# Patient Record
Sex: Female | Born: 1935
Health system: Southern US, Community
[De-identification: ages and names within clinical notes are randomized; demographics above are authoritative.]

## PROBLEM LIST (undated history)

## (undated) DIAGNOSIS — I509 Heart failure, unspecified: Secondary | ICD-10-CM

## (undated) DIAGNOSIS — E871 Hypo-osmolality and hyponatremia: Secondary | ICD-10-CM

## (undated) DIAGNOSIS — J309 Allergic rhinitis, unspecified: Secondary | ICD-10-CM

## (undated) DIAGNOSIS — N8110 Cystocele, unspecified: Secondary | ICD-10-CM

## (undated) DIAGNOSIS — M1990 Unspecified osteoarthritis, unspecified site: Secondary | ICD-10-CM

## (undated) DIAGNOSIS — K635 Polyp of colon: Secondary | ICD-10-CM

## (undated) DIAGNOSIS — C679 Malignant neoplasm of bladder, unspecified: Secondary | ICD-10-CM

## (undated) DIAGNOSIS — E7800 Pure hypercholesterolemia, unspecified: Secondary | ICD-10-CM

## (undated) DIAGNOSIS — R0602 Shortness of breath: Secondary | ICD-10-CM

## (undated) DIAGNOSIS — C189 Malignant neoplasm of colon, unspecified: Secondary | ICD-10-CM

## (undated) DIAGNOSIS — I499 Cardiac arrhythmia, unspecified: Secondary | ICD-10-CM

## (undated) DIAGNOSIS — I2510 Atherosclerotic heart disease of native coronary artery without angina pectoris: Secondary | ICD-10-CM

## (undated) DIAGNOSIS — G4733 Obstructive sleep apnea (adult) (pediatric): Secondary | ICD-10-CM

## (undated) DIAGNOSIS — R040 Epistaxis: Secondary | ICD-10-CM

## (undated) DIAGNOSIS — K5790 Diverticulosis of intestine, part unspecified, without perforation or abscess without bleeding: Secondary | ICD-10-CM

## (undated) DIAGNOSIS — R0989 Other specified symptoms and signs involving the circulatory and respiratory systems: Secondary | ICD-10-CM

## (undated) DIAGNOSIS — K8020 Calculus of gallbladder without cholecystitis without obstruction: Secondary | ICD-10-CM

## (undated) DIAGNOSIS — D649 Anemia, unspecified: Secondary | ICD-10-CM

## (undated) HISTORY — PX: CARDIAC CATHETERIZATION: SHX172

## (undated) HISTORY — DX: Malignant neoplasm of bladder, unspecified: C67.9

## (undated) HISTORY — DX: Shortness of breath: R06.02

## (undated) HISTORY — PX: COLONOSCOPY: SHX174

## (undated) HISTORY — DX: Epistaxis: R04.0

## (undated) HISTORY — DX: Heart failure, unspecified: I50.9

## (undated) HISTORY — DX: Other specified symptoms and signs involving the circulatory and respiratory systems: R09.89

## (undated) HISTORY — DX: Allergic rhinitis, unspecified: J30.9

## (undated) HISTORY — PX: OTHER SURGICAL HISTORY: SHX169

## (undated) HISTORY — PX: ABDOMINAL HYSTERECTOMY: SHX81

## (undated) HISTORY — PX: COLECTOMY: SHX59

## (undated) HISTORY — DX: Pure hypercholesterolemia, unspecified: E78.00

## (undated) HISTORY — DX: Malignant neoplasm of colon, unspecified: C18.9

## (undated) HISTORY — DX: Calculus of gallbladder without cholecystitis without obstruction: K80.20

## (undated) HISTORY — DX: Cystocele, unspecified: N81.10

## (undated) HISTORY — DX: Hypo-osmolality and hyponatremia: E87.1

## (undated) HISTORY — DX: Cardiac arrhythmia, unspecified: I49.9

## (undated) HISTORY — DX: Diverticulosis of intestine, part unspecified, without perforation or abscess without bleeding: K57.90

## (undated) HISTORY — DX: Unspecified osteoarthritis, unspecified site: M19.90

## (undated) HISTORY — DX: Atherosclerotic heart disease of native coronary artery without angina pectoris: I25.10

## (undated) HISTORY — DX: Anemia, unspecified: D64.9

## (undated) HISTORY — DX: Polyp of colon: K63.5

## (undated) HISTORY — DX: Obstructive sleep apnea (adult) (pediatric): G47.33

## (undated) HISTORY — PX: CHOLECYSTECTOMY: SHX55

---

## 1987-12-18 DIAGNOSIS — C189 Malignant neoplasm of colon, unspecified: Secondary | ICD-10-CM

## 1987-12-18 HISTORY — DX: Malignant neoplasm of colon, unspecified: C18.9

## 1998-08-15 ENCOUNTER — Other Ambulatory Visit: Admission: RE | Admit: 1998-08-15 | Discharge: 1998-08-15 | Payer: Self-pay | Admitting: *Deleted

## 1998-09-24 ENCOUNTER — Emergency Department (HOSPITAL_COMMUNITY): Admission: EM | Admit: 1998-09-24 | Discharge: 1998-09-24 | Payer: Self-pay | Admitting: Emergency Medicine

## 1999-01-18 ENCOUNTER — Emergency Department (HOSPITAL_COMMUNITY): Admission: EM | Admit: 1999-01-18 | Discharge: 1999-01-19 | Payer: Self-pay | Admitting: Emergency Medicine

## 1999-09-04 ENCOUNTER — Other Ambulatory Visit: Admission: RE | Admit: 1999-09-04 | Discharge: 1999-09-04 | Payer: Self-pay | Admitting: *Deleted

## 1999-09-13 ENCOUNTER — Ambulatory Visit (HOSPITAL_COMMUNITY): Admission: RE | Admit: 1999-09-13 | Discharge: 1999-09-13 | Payer: Self-pay | Admitting: Gastroenterology

## 2001-02-26 ENCOUNTER — Other Ambulatory Visit: Admission: RE | Admit: 2001-02-26 | Discharge: 2001-02-26 | Payer: Self-pay | Admitting: *Deleted

## 2002-06-23 ENCOUNTER — Other Ambulatory Visit: Admission: RE | Admit: 2002-06-23 | Discharge: 2002-06-23 | Payer: Self-pay | Admitting: *Deleted

## 2005-01-29 ENCOUNTER — Inpatient Hospital Stay (HOSPITAL_COMMUNITY): Admission: EM | Admit: 2005-01-29 | Discharge: 2005-01-31 | Payer: Self-pay | Admitting: Emergency Medicine

## 2005-09-20 ENCOUNTER — Encounter: Admission: RE | Admit: 2005-09-20 | Discharge: 2005-09-20 | Payer: Self-pay | Admitting: Orthopaedic Surgery

## 2005-10-09 ENCOUNTER — Encounter: Admission: RE | Admit: 2005-10-09 | Discharge: 2005-10-09 | Payer: Self-pay | Admitting: Orthopaedic Surgery

## 2005-11-06 ENCOUNTER — Encounter: Admission: RE | Admit: 2005-11-06 | Discharge: 2005-11-06 | Payer: Self-pay | Admitting: Orthopaedic Surgery

## 2007-09-17 ENCOUNTER — Ambulatory Visit: Payer: Self-pay | Admitting: Gastroenterology

## 2007-09-24 ENCOUNTER — Ambulatory Visit: Payer: Self-pay | Admitting: Gastroenterology

## 2007-09-24 ENCOUNTER — Encounter: Payer: Self-pay | Admitting: Gastroenterology

## 2009-02-22 ENCOUNTER — Encounter: Admission: RE | Admit: 2009-02-22 | Discharge: 2009-02-22 | Payer: Self-pay | Admitting: Internal Medicine

## 2009-02-22 ENCOUNTER — Other Ambulatory Visit: Admission: RE | Admit: 2009-02-22 | Discharge: 2009-02-22 | Payer: Self-pay | Admitting: Interventional Radiology

## 2009-02-22 ENCOUNTER — Encounter (INDEPENDENT_AMBULATORY_CARE_PROVIDER_SITE_OTHER): Payer: Self-pay | Admitting: Interventional Radiology

## 2009-12-05 ENCOUNTER — Emergency Department (HOSPITAL_COMMUNITY): Admission: EM | Admit: 2009-12-05 | Discharge: 2009-12-06 | Payer: Self-pay | Admitting: Emergency Medicine

## 2009-12-22 ENCOUNTER — Encounter: Admission: RE | Admit: 2009-12-22 | Discharge: 2009-12-22 | Payer: Self-pay | Admitting: Cardiology

## 2010-01-10 ENCOUNTER — Inpatient Hospital Stay (HOSPITAL_BASED_OUTPATIENT_CLINIC_OR_DEPARTMENT_OTHER): Admission: RE | Admit: 2010-01-10 | Discharge: 2010-01-10 | Payer: Self-pay | Admitting: Cardiology

## 2010-08-30 ENCOUNTER — Ambulatory Visit: Payer: Self-pay | Admitting: Cardiology

## 2010-10-31 ENCOUNTER — Ambulatory Visit: Payer: Self-pay | Admitting: Cardiology

## 2011-01-21 ENCOUNTER — Emergency Department (HOSPITAL_COMMUNITY)
Admission: EM | Admit: 2011-01-21 | Discharge: 2011-01-21 | Disposition: A | Discharge status: Home / Self Care | Payer: Medicare Other | Attending: Emergency Medicine | Admitting: Emergency Medicine

## 2011-01-21 DIAGNOSIS — I10 Essential (primary) hypertension: Secondary | ICD-10-CM | POA: Insufficient documentation

## 2011-01-21 DIAGNOSIS — Z85038 Personal history of other malignant neoplasm of large intestine: Secondary | ICD-10-CM | POA: Insufficient documentation

## 2011-01-21 DIAGNOSIS — R040 Epistaxis: Secondary | ICD-10-CM | POA: Insufficient documentation

## 2011-01-21 LAB — PROTIME-INR
INR: 0.97 (ref 0.00–1.49)
Prothrombin Time: 13.1 seconds (ref 11.6–15.2)

## 2011-01-21 LAB — CBC
HCT: 38.2 % (ref 36.0–46.0)
Hemoglobin: 13.3 g/dL (ref 12.0–15.0)
MCH: 32.7 pg (ref 26.0–34.0)
MCHC: 34.8 g/dL (ref 30.0–36.0)
MCV: 93.9 fL (ref 78.0–100.0)
Platelets: 237 10*3/uL (ref 150–400)
RDW: 12.4 % (ref 11.5–15.5)
WBC: 11.4 10*3/uL — ABNORMAL HIGH (ref 4.0–10.5)

## 2011-03-04 LAB — BASIC METABOLIC PANEL
BUN: 43 mg/dL — ABNORMAL HIGH (ref 6–23)
CO2: 28 mEq/L (ref 19–32)
Calcium: 8.9 mg/dL (ref 8.4–10.5)
Chloride: 102 mEq/L (ref 96–112)
Creatinine, Ser: 0.96 mg/dL (ref 0.4–1.2)
GFR calc Af Amer: >60 mL/min (ref >60)
GFR calc non Af Amer: 56 mL/min — ABNORMAL LOW (ref >60)
Glucose, Bld: 97 mg/dL (ref 70–99)
Potassium: 3.9 mEq/L (ref 3.5–5.1)
Sodium: 137 mEq/L (ref 135–145)

## 2011-03-19 LAB — URINALYSIS, ROUTINE W REFLEX MICROSCOPIC
Bilirubin Urine: NEGATIVE
Glucose, UA: NEGATIVE mg/dL
Hgb urine dipstick: NEGATIVE
Ketones, ur: NEGATIVE mg/dL
Nitrite: NEGATIVE
Protein, ur: NEGATIVE mg/dL
Specific Gravity, Urine: 1.03 (ref 1.005–1.030)
Urobilinogen, UA: 0.2 mg/dL (ref 0.0–1.0)
pH: 5.5 (ref 5.0–8.0)

## 2011-03-19 LAB — POCT I-STAT, CHEM 8
BUN: 56 mg/dL — ABNORMAL HIGH (ref 6–23)
Calcium, Ion: 1.1 mmol/L — ABNORMAL LOW (ref 1.12–1.32)
Chloride: 105 mEq/L (ref 96–112)
Creatinine, Ser: 1.1 mg/dL (ref 0.4–1.2)
Glucose, Bld: 175 mg/dL — ABNORMAL HIGH (ref 70–99)
HCT: 38 % (ref 36.0–46.0)
Hemoglobin: 12.9 g/dL (ref 12.0–15.0)
Potassium: 5.7 mEq/L — ABNORMAL HIGH (ref 3.5–5.1)
Sodium: 134 mEq/L — ABNORMAL LOW (ref 135–145)
TCO2: 28 mmol/L (ref 0–100)

## 2011-03-19 LAB — DIFFERENTIAL
Basophils Absolute: 0.1 10*3/uL (ref 0.0–0.1)
Basophils Relative: 1 % (ref 0–1)
Eosinophils Absolute: 0 10*3/uL (ref 0.0–0.7)
Eosinophils Relative: 1 % (ref 0–5)
Lymphocytes Relative: 13 % (ref 12–46)
Lymphs Abs: 1.2 10*3/uL (ref 0.7–4.0)
Monocytes Absolute: 0.5 10*3/uL (ref 0.1–1.0)
Monocytes Relative: 6 % (ref 3–12)
Neutro Abs: 7.3 10*3/uL (ref 1.7–7.7)
Neutrophils Relative %: 79 % — ABNORMAL HIGH (ref 43–77)

## 2011-03-19 LAB — URINE CULTURE
Colony Count: NO GROWTH
Culture: NO GROWTH

## 2011-03-19 LAB — CBC
HCT: 38.1 % (ref 36.0–46.0)
Hemoglobin: 12.8 g/dL (ref 12.0–15.0)
MCHC: 33.6 g/dL (ref 30.0–36.0)
MCV: 93.8 fL (ref 78.0–100.0)
Platelets: 194 10*3/uL (ref 150–400)
RBC: 4.06 MIL/uL (ref 3.87–5.11)
RDW: 13.4 % (ref 11.5–15.5)
WBC: 9.2 10*3/uL (ref 4.0–10.5)

## 2011-03-19 LAB — SAMPLE TO BLOOD BANK

## 2011-05-04 NOTE — Discharge Summary (Signed)
Paige Cook, Paige Cook                ACCOUNT NO.:  [id number]   MEDICAL RECORD NO.:  [id number]          PATIENT TYPE:  INP   LOCATION:  [id number]                         FACILITY:  [facility]   PHYSICIAN:  Sherryl Manges, M.D.  DATE OF BIRTH:  [birth year]   DATE OF ADMISSION:  01/29/2005  DATE OF DISCHARGE:  01/31/2005                                 DISCHARGE SUMMARY   PRIMARY CARE PHYSICIAN:  Dr. Levin Erp.   DISCHARGE DIAGNOSES:  1.  Acute gastroenteritis.  2.  Urinary tract infection.   DISCHARGE MEDICATIONS:  1.  Ciprofloxacin 250 mg p.o. b.i.d. x6 days only.  2.  Continue preadmission medications.   PROCEDURE:  None.   CONSULTATIONS:  None.   ADMISSION HISTORY:  As in H&P notes of January 29, 2005; however, in brief,  this is a 77 year old female with past medical history significant for colon  cancer diagnosed in 1989, status post partial colectomy.  Normal subsequent  colonoscopies.  Status post bladder tacking surgery.  Presents with a 3-4  day history of nausea, vomiting, and diarrhea with liquid, yellow stools,  over 10 per day.  No antecedent respiratory tract infection, sick contacts,  recent travel, or antibiotic therapy.  She was admitted for evaluation,  investigation, and management.   CLINICAL COURSE:  1.  Acute gastroenteritis:  Patient presented with a 3-4 day history of      nausea, vomiting, and diarrhea.  On initial evaluation in the emergency      department, she was found to be orthostatic.  Also, BUN was elevated at      66 with a creatinine of 1.3, suggestive of dehydration.  Serum amylase      and lipase levels were within normal limits.  The patient had no      associated abdominal pain.  C. difficile toxin was negative.  No WBCs      were seen on fecal microscopy.  Patient was managed with intravenous      fluid hydration, antiemetics, proton pump inhibitors, and loperamide      with considerable amelioration of symptoms.  Certainly, by the a.m. of    January 31, 2005, the patient was completely asymptomatic.      Electrolytes on the day of discharge showed a BUN of 10 and a creatinine      of 0.9.   1.  Urinary tract infection:  Urinalysis on January 29, 2005 showed a      specific gravity of 1.025, pH 6, with many bacteria, wbc of 0 to 2.      This is consistent with asymptomatic bacteriuria.  Given the patient's      age, it is felt that she was at risk for symptomatic urinary tract      infection.  She was therefore treated with ciprofloxacin with a 7-day      course planned.   DISPOSITION:  Patient was discharged in satisfactory condition on February 01, 2005.   PAIN MANAGEMENT:  Not applicable.   ACTIVITY:  No restrictions.   DIET:  Recommended to avoid fruits, uncooked vegetables, and fresh  milk for  about five days.   WOUND CARE:  Not applicable.   SPECIAL INSTRUCTIONS:  Patient is recommended to ensure adequate fluid  intake.   FOLLOW UP:  She is recommended to follow up routinely with her primary care  physician, Dr. Levin Erp.  She is to call for an appointment.      CO/MEDQ  D:  02/01/2005  T:  02/01/2005  Job:  [id number]   cc:   Lance Muss, M.D.  [address], [place]  [place] [address]  Fax: [phone]

## 2011-05-04 NOTE — H&P (Signed)
[name], Cook                ACCOUNT NO.:  [id number]   MEDICAL RECORD NO.:  [id number]          PATIENT TYPE:  EMS   LOCATION:  ED                           FACILITY:  [facility]   PHYSICIAN:  Rexene Alberts, M.D.    DATE OF BIRTH:  Aug 28, 1928   DATE OF ADMISSION:  01/29/2005  DATE OF DISCHARGE:                                HISTORY & PHYSICAL   PRIMARY CARE PHYSICIAN:  Lance Muss, M.D.   CHIEF COMPLAINT:  Nausea, vomiting, diarrhea, and generalized weakness.   HISTORY OF PRESENT ILLNESS:  The patient is a 77 year old lady with a past  medical history significant for colon cancer who presents to the emergency  department this morning with a 3-4 day history of nausea, vomiting, and  diarrhea.  The symptoms started early in the morning approximately three  days ago.  It started with vomiting initially but later on in the day and  night, she began to have diarrhea.  The nausea and vomiting subsided on day  #1;  however, the diarrhea continued.  The following day, she again had  nausea and vomiting along with diarrhea.  The nausea, vomiting, and diarrhea  have persisted for at least 3-4 days now.  She has vomited approximately two  times each day for the past three days.  She has had more than 10 stools  each day over the past 3-4 days.  Initially, the volume of stools were  large, but now she only has small stools.  The stools are liquidy and yellow  in color.  She denies any bloody stools, and she denies any black tarry  stools.  She has not had any coffee-ground emesis nor has she had bright red  blood in her emesis.  She has only had mild abdominal cramping this morning,  none over the previous three days.  She has been able to keep a little  ginger ale and water down this morning for the first time.  She has had no  upper respiratory infection symptoms.  She has not been exposed to any sick  contacts.  Her husband has none of her symptoms in spite of eating the same  foods,  etc.  The patient has not had any antibiotic therapy recently.  No  travel recently.  No insect bites.  The patient did eat some deli meat on  Thursday;  however, her husband ate it as well, and he does not have any of  her symptoms.  The patient has had no fever or chills.   When the patient was evaluated in the emergency department, she was afebrile  with a temperature of 98.7, blood pressure 161/81, pulse 103, and oxygen  saturation 96%.  When the patient stood up, her blood pressure fell to  132/71.  Her white blood cell count is mildly elevated at 13.4.  Her BUN is  elevated at 66, and the creatinine is elevated at 1.3.  The patient will be  admitted for further evaluation and management.   PAST MEDICAL HISTORY:  1.  Colon cancer diagnosed in 1989, status post partial colectomy.  All of      the follow-up colonoscopies have been within normal limits.  2.  Status post bladder tacking surgery x2.  Postoperative anemia.      Perioperative lysis of adhesions.  3.  Chronic vaginitis on topical Premarin cream.   MEDICATIONS:  1.  Vitamin C 500 mg once or twice daily.  2.  Premarin cream as needed.   ALLERGIES:  PENICILLIN which causes a rash.   SOCIAL HISTORY:  The patient is married and lives with her husband in  [place], [place].  She has four children.  One son died in 24  secondary to AIDS.  She does not smoke.  She drinks wine occasionally.  She  is a retired Oncologist.   FAMILY HISTORY:  Her father died of a heart attack at 72 years of age.  Her  mother died of heart disease at 97 years of age.   REVIEW OF SYSTEMS:  The patient review of systems is otherwise negative.   PHYSICAL EXAMINATION:  VITAL SIGNS:  Temperature 98.7, blood pressure  161/81, repeated at 132/71 while standing, pulse 103, respiratory rate 18,  oxygen saturation 96% on room air.  GENERAL:  The patient is a pleasant, 77 year old Caucasian lady who is  currently lying in bed in no  acute distress.  HEENT:  Head is normocephalic, nontraumatic.  Pupils are equal, round, and  reactive to light.  Extraocular movements are intact.  Conjunctivae are  clear.  Sclerae are white.  Tympanic membranes are clear bilaterally.  Nasal  mucosa is mildly dry.  No sinus tenderness.  Oropharynx reveals fairly good  dentition.  Mucous membranes are mildly dry.  No posterior exudates or  erythema.  NECK:  Supple.  Question mild thyromegaly.  No bruit, no JVD, no adenopathy.  LUNGS:  Clear to auscultation bilaterally.  HEART:  S1 and S2 with mild tachycardia.  ABDOMEN:  Vertical hypogastric scar, well healed.  Right upper quadrant  scar, well healed.  Abdomen is mildly obese.  Bowel sounds are present.  Abdomen is soft, nontender, nondistended, no hepatosplenomegaly, no masses  palpated.  RECTAL and GENITOURINARY:  Deferred.  EXTREMITIES:  Pedal pulses 2+ bilaterally.  No pretibial edema, no pedal  edema.  No acute joint abnormalities.  The patient has good range of motion  of all of her joints.  NEUROLOGIC:  The patient is alert and oriented x3.  Cranial nerves II-XII  are intact.  Strength is 5/5 throughout.  Sensation is intact throughout.   ADMISSION LABORATORIES:  Urinalysis:  Urine bilirubin small, urine  hemoglobin negative, urine glucose negative, urine specific gravity 1.025,  urine protein 30, urine ketones trace, urine nitrite negative, urine  leukocyte esterase negative, urine epithelial cells many, bacteria many,  wbc's 0-2.  Sodium 136, potassium 3.3, chloride 100, CO2 26, glucose 161,  BUN 66, creatinine 1.3, calcium 9.4.  WBC 13.4, hemoglobin 16.3, hematocrit  47.3, MCV 90.4, platelets 306,000.   ASSESSMENT:  1.  NAUSEA, VOMITING, DIARRHEA, MOST LIKELY SECONDARY TO GASTROENTERITIS.      The patient is orthostatic and is mildly tachycardic.  She is obviously     volume depleted clinically and per the laboratory data.  2.  ACUTE RENAL INSUFFICIENCY.  The patient's BUN  is 66, and creatinine is      1.3.  The patient has no prior history of renal disease.  The acute      renal insufficiency if probably secondary to prerenal azotemia.  3.  HYPOKALEMIA.  The hypokalemia is probably secondary to the diarrhea.  4.  LEUKOCYTOSIS.  The patient's white blood cell count is mildly elevated      at 13.4.  The leukocytosis is probably a reactive leukocytosis versus a      viral and/or bacterial gastroenteritis.  The patient is afebrile,      however.  5.  HYPERGLYCEMIA.  The patient's glucose is 161.  The patient has no prior      history of diabetes.   PLAN:  1.  The patient will be admitted for further evaluation and management.  2.  Will hold on antibiotics and antiparasitic medication for now.  3.  Will check stool specimens for routine culture and sensitivity,      Clostridium difficile, Giardia, Cryptosporidium, and fecal wbc's.  4.  Will also guaiac stools x2.  5.  Volume repletion with D-5-normal saline with 30 mEq of potassium      chloride added.  6.  Phenergan as needed for nausea.  7.  Empiric Protonix at 40 mg IV daily.  8.  Will check TSH and hemoglobin A1c in the a.m.  9.  Will follow renal function and renal function tests.      DF/MEDQ  D:  01/29/2005  T:  01/29/2005  Job:  [id number]   cc:   Lance Muss, M.D.  [address], [place]  [place] [address]  Fax: [phone]

## 2011-05-18 ENCOUNTER — Ambulatory Visit (INDEPENDENT_AMBULATORY_CARE_PROVIDER_SITE_OTHER): Payer: Medicare Other | Admitting: Cardiology

## 2011-05-18 ENCOUNTER — Encounter: Payer: Self-pay | Admitting: *Deleted

## 2011-05-18 ENCOUNTER — Encounter: Payer: Self-pay | Admitting: Cardiology

## 2011-05-18 DIAGNOSIS — I428 Other cardiomyopathies: Secondary | ICD-10-CM

## 2011-05-18 DIAGNOSIS — I509 Heart failure, unspecified: Secondary | ICD-10-CM

## 2011-05-18 DIAGNOSIS — R0989 Other specified symptoms and signs involving the circulatory and respiratory systems: Secondary | ICD-10-CM | POA: Insufficient documentation

## 2011-05-18 DIAGNOSIS — I2510 Atherosclerotic heart disease of native coronary artery without angina pectoris: Secondary | ICD-10-CM

## 2011-05-18 DIAGNOSIS — E7800 Pure hypercholesterolemia, unspecified: Secondary | ICD-10-CM

## 2011-05-18 LAB — CBC WITH DIFFERENTIAL/PLATELET
Basophils Absolute: 0 10*3/uL (ref 0.0–0.1)
Basophils Relative: 0.6 % (ref 0.0–3.0)
Eosinophils Absolute: 0.3 10*3/uL (ref 0.0–0.7)
Eosinophils Relative: 4.1 % (ref 0.0–5.0)
HCT: 36.1 % (ref 36.0–46.0)
Hemoglobin: 12.5 g/dL (ref 12.0–15.0)
Lymphocytes Relative: 19.9 % (ref 12.0–46.0)
Lymphs Abs: 1.4 10*3/uL (ref 0.7–4.0)
MCHC: 34.5 g/dL (ref 30.0–36.0)
MCV: 96.4 fl (ref 78.0–100.0)
Monocytes Absolute: 0.6 10*3/uL (ref 0.1–1.0)
Monocytes Relative: 8.8 % (ref 3.0–12.0)
Neutro Abs: 4.8 10*3/uL (ref 1.4–7.7)
Neutrophils Relative %: 66.6 % (ref 43.0–77.0)
Platelets: 188 10*3/uL (ref 150.0–400.0)
RBC: 3.75 Mil/uL — ABNORMAL LOW (ref 3.87–5.11)
RDW: 13 % (ref 11.5–14.6)
WBC: 7.3 10*3/uL (ref 4.5–10.5)

## 2011-05-18 LAB — LIPID PANEL
Cholesterol: 189 mg/dL (ref 0–200)
HDL: 85.6 mg/dL (ref >39.00)
LDL Cholesterol: 92 mg/dL (ref 0–99)
Total CHOL/HDL Ratio: 2
Triglycerides: 57 mg/dL (ref 0.0–149.0)
VLDL: 11.4 mg/dL (ref 0.0–40.0)

## 2011-05-18 LAB — BASIC METABOLIC PANEL
BUN: 34 mg/dL — ABNORMAL HIGH (ref 6–23)
CO2: 30 mEq/L (ref 19–32)
Calcium: 9.1 mg/dL (ref 8.4–10.5)
Chloride: 104 mEq/L (ref 96–112)
Creatinine, Ser: 1.1 mg/dL (ref 0.4–1.2)
GFR: 51.75 mL/min — ABNORMAL LOW (ref >60.00)
Glucose, Bld: 113 mg/dL — ABNORMAL HIGH (ref 70–99)
Potassium: 4.4 mEq/L (ref 3.5–5.1)
Sodium: 140 mEq/L (ref 135–145)

## 2011-05-18 LAB — HEPATIC FUNCTION PANEL
ALT: 20 U/L (ref 0–35)
AST: 22 U/L (ref 0–37)
Albumin: 3.8 g/dL (ref 3.5–5.2)
Alkaline Phosphatase: 56 U/L (ref 39–117)
Bilirubin, Direct: 0.1 mg/dL (ref 0.0–0.3)
Total Bilirubin: 0.8 mg/dL (ref 0.3–1.2)
Total Protein: 6.2 g/dL (ref 6.0–8.3)

## 2011-05-18 NOTE — Assessment & Plan Note (Signed)
We will follow up on fasting lab work today including chemistries and lipid panel. Patient is on simvastatin.

## 2011-05-18 NOTE — Assessment & Plan Note (Signed)
She has a loud left carotid bruit. Her last Doppler study was in 2003. We will obtain a followup carotid Doppler study.

## 2011-05-18 NOTE — Patient Instructions (Signed)
We will schedule you for an Echocardiogram and Carotid doppler studies.  We will check fasting lab work today and call you with the results.  Continue your current medications.  I will see you back in 6 months.

## 2011-05-18 NOTE — Assessment & Plan Note (Signed)
She appears to be well compensated on current medical therapy with ACE inhibitors and carvedilol. She is euvolemic on Lasix. We will schedule her for a followup echocardiogram.

## 2011-05-18 NOTE — Progress Notes (Signed)
   Paige Cook Date of Birth: 1928/03/03   History of Present Illness: Paige Cook is seen for followup today. She states she is feeling well. She is able to get out and work in the yard without any difficulty. She denies any shortness of breath, PND, orthopnea, or edema. She has had some sharp discomfort beneath and lateral to her left breast on one night. This has resolved. She denies any TIA or CVA symptoms.  Current Outpatient Prescriptions on File Prior to Visit  Medication Sig Dispense Refill  . aspirin 81 MG tablet Take 81 mg by mouth daily.        . carvedilol (COREG) 6.25 MG tablet Take 6.25 mg by mouth 2 (two) times daily with a meal.        . conjugated estrogens (PREMARIN) vaginal cream Place vaginally daily. 2 times/week       . fish oil-omega-3 fatty acids 1000 MG capsule Take 2 g by mouth daily.        . furosemide (LASIX) 40 MG tablet Take 40 mg by mouth daily.        Marland Kitchen glucosamine-chondroitin 500-400 MG tablet Take 1 tablet by mouth daily.        . ramipril (ALTACE) 5 MG tablet Take 5 mg by mouth daily.        . simvastatin (ZOCOR) 20 MG tablet Take 20 mg by mouth at bedtime.        . vitamin C (ASCORBIC ACID) 500 MG tablet Take 500 mg by mouth daily.          Allergies  Allergen Reactions  . Penicillins     Past Medical History  Diagnosis Date  . CHF (congestive heart failure)     CHF with systolic dysfuntion ; dilated nonishemic cardiomyopathy  . Cancer     1989 colon    Past Surgical History  Procedure Date  . Cardiac catheterization     non obstruction CAD  . Cholecystectomy   . Bladder tact   . Colectomy     sigmoid 1989 cancer    History  Smoking status  . Never Smoker   Smokeless tobacco  . Not on file    History  Alcohol Use No    Family History  Problem Relation Age of Onset  . Heart disease Father     Review of Systems:  All other systems were reviewed and are negative.  Physical Exam: BP 116/60  Pulse 60  Ht 5\' 6"  (1.676 m)   Wt 132 lb 9.6 oz (60.147 kg)  BMI 21.40 kg/m2 She is a pleasant elderly white female in no acute distress. Her HEENT exam is unremarkable. She has no JVD. She has a loud left carotid bruit. There is no thyromegaly or adenopathy. Lungs are clear. Cardiac exam reveals a regular rate and rhythm without gallop or murmur. Abdomen is soft and nontender. She has no hepatosplenomegaly. She has no edema. Pedal pulses are good. LABORATORY DATA:   Assessment / Plan:

## 2011-05-21 ENCOUNTER — Telehealth: Payer: Self-pay | Admitting: *Deleted

## 2011-05-21 NOTE — Telephone Encounter (Signed)
Message copied by Hetty Blend on Mon May 21, 2011  4:13 PM ------      Message from: [place], PETER M      Created: Fri May 18, 2011  5:28 PM       CBC and chemistries are good. Lipids are in good shape.

## 2011-05-21 NOTE — Telephone Encounter (Signed)
Notified of lab results. Will send pt copy and Dr. Reynaldo Minium.

## 2011-05-30 ENCOUNTER — Encounter (INDEPENDENT_AMBULATORY_CARE_PROVIDER_SITE_OTHER): Payer: Medicare Other | Admitting: *Deleted

## 2011-05-30 ENCOUNTER — Ambulatory Visit (HOSPITAL_COMMUNITY): Payer: Medicare Other | Attending: Cardiology | Admitting: Radiology

## 2011-05-30 DIAGNOSIS — I079 Rheumatic tricuspid valve disease, unspecified: Secondary | ICD-10-CM | POA: Insufficient documentation

## 2011-05-30 DIAGNOSIS — I359 Nonrheumatic aortic valve disorder, unspecified: Secondary | ICD-10-CM | POA: Insufficient documentation

## 2011-05-30 DIAGNOSIS — R0989 Other specified symptoms and signs involving the circulatory and respiratory systems: Secondary | ICD-10-CM

## 2011-05-30 DIAGNOSIS — I428 Other cardiomyopathies: Secondary | ICD-10-CM

## 2011-05-30 DIAGNOSIS — E785 Hyperlipidemia, unspecified: Secondary | ICD-10-CM | POA: Insufficient documentation

## 2011-06-01 ENCOUNTER — Encounter: Payer: Self-pay | Admitting: Cardiology

## 2011-06-01 NOTE — Progress Notes (Signed)
Patient called with echo results. Pt verbalized understanding. Jodette Mirca Yale RN  

## 2011-06-01 NOTE — Progress Notes (Signed)
Patient called with carotid results. Pt verbalized understanding. Corwin Levins RN

## 2011-06-30 ENCOUNTER — Other Ambulatory Visit: Payer: Self-pay | Admitting: Cardiology

## 2011-07-02 NOTE — Telephone Encounter (Signed)
escribe medication per fax request  

## 2011-07-28 ENCOUNTER — Other Ambulatory Visit: Payer: Self-pay | Admitting: Cardiology

## 2011-07-30 NOTE — Telephone Encounter (Signed)
escribe medication per fax request  

## 2011-09-12 ENCOUNTER — Telehealth: Payer: Self-pay | Admitting: Cardiology

## 2011-09-12 NOTE — Telephone Encounter (Signed)
Pt is seeing urologist and he told her to stop ASA.  She wants to discuss this with you.  Please call her back.

## 2011-09-12 NOTE — Telephone Encounter (Signed)
Called wanting to know if she could stop ASA. Is having several test by urologist and he wants her to stop ASA. OK per Dr. Martinique to stop until all tests resolved.

## 2011-09-18 ENCOUNTER — Telehealth: Payer: Self-pay | Admitting: Cardiology

## 2011-09-18 NOTE — Telephone Encounter (Signed)
PT HAVING TUMOR REMOVED FROM BLADDER AND HAS QUESTIONS RE HER HEART

## 2011-09-18 NOTE — Telephone Encounter (Signed)
Is going to [facility] tomorrow to discuss surgery for bladder tumor. Wants to take records with her. Advised will copy last office visit for her to take w/her. Will pick up. Advised they will have to send [place] a request for cardiac clearance. Will give her fax number.

## 2011-10-02 ENCOUNTER — Encounter: Payer: Self-pay | Admitting: Cardiology

## 2011-10-02 ENCOUNTER — Ambulatory Visit (INDEPENDENT_AMBULATORY_CARE_PROVIDER_SITE_OTHER): Payer: Medicare Other | Admitting: Cardiology

## 2011-10-02 ENCOUNTER — Telehealth: Payer: Self-pay | Admitting: Cardiology

## 2011-10-02 VITALS — BP 130/88 | HR 66 | Ht 66.0 in | Wt 126.0 lb

## 2011-10-02 DIAGNOSIS — I420 Dilated cardiomyopathy: Secondary | ICD-10-CM

## 2011-10-02 DIAGNOSIS — I509 Heart failure, unspecified: Secondary | ICD-10-CM

## 2011-10-02 DIAGNOSIS — I428 Other cardiomyopathies: Secondary | ICD-10-CM

## 2011-10-02 DIAGNOSIS — D494 Neoplasm of unspecified behavior of bladder: Secondary | ICD-10-CM

## 2011-10-02 DIAGNOSIS — R0989 Other specified symptoms and signs involving the circulatory and respiratory systems: Secondary | ICD-10-CM

## 2011-10-02 NOTE — Assessment & Plan Note (Signed)
No carotid obstruction by Doppler.

## 2011-10-02 NOTE — Telephone Encounter (Signed)
Pt was to call with info to the doctor at [facility].  Phone number is [phone] number [phone] Dr. Vito Berger.

## 2011-10-02 NOTE — Assessment & Plan Note (Signed)
She has moderate LV dysfunction by her as recent echocardiogram. She is well compensated on medical therapy. We will continue with her current treatment and they have cleared her for her bladder surgery.

## 2011-10-02 NOTE — Patient Instructions (Signed)
Stay off ASA until your bladder issue is resolved.  Continue your other medications.  You are cleared for bladder surgery.  I will see you again in 6 months.

## 2011-10-02 NOTE — Telephone Encounter (Signed)
Called w/ doctor's name at [facility] that is doing bladder surgery. His name is Clarissa; [phone]; fax [phone]. Will fax clearance.

## 2011-10-02 NOTE — Progress Notes (Signed)
[name] Date of Birth: 08-Oct-1928   History of Present Illness: Mrs. Behanna is seen for followup today. Since her last visit in June she developed hematuria. She has been diagnosed with a bladder tumor. She is scheduled for surgery at [facility] later this month. She has been off of her aspirin. She denies any shortness of breath or chest pain. She's had no palpitations or dizziness. She does complain that she gets tired easily. She has lost 6 pounds since her last visit. After her visit in June we obtain carotid Doppler studies which were negative for obstruction. Her echocardiogram showed improvement in her ejection fraction to 40%.  Current Outpatient Prescriptions on File Prior to Visit  Medication Sig Dispense Refill  . aspirin 81 MG tablet Take 81 mg by mouth daily.        . carvedilol (COREG) 6.25 MG tablet TAKE 1 TABLET TWICE A DAY  60 tablet  5  . conjugated estrogens (PREMARIN) vaginal cream Place vaginally daily. 2 times/week       . fish oil-omega-3 fatty acids 1000 MG capsule Take 2 g by mouth daily.        . furosemide (LASIX) 40 MG tablet TAKE 1 TABLET EVERY DAY  30 tablet  5  . glucosamine-chondroitin 500-400 MG tablet Take 1 tablet by mouth daily.        . ramipril (ALTACE) 5 MG capsule TAKE ONE CAPSULE BY MOUTH EVERY DAY  30 capsule  5  . simvastatin (ZOCOR) 20 MG tablet Take 20 mg by mouth at bedtime.        . vitamin C (ASCORBIC ACID) 500 MG tablet Take 500 mg by mouth daily.        Marland Kitchen DISCONTD: ramipril (ALTACE) 5 MG tablet Take 5 mg by mouth daily.          Allergies  Allergen Reactions  . Penicillins     Past Medical History  Diagnosis Date  . CHF (congestive heart failure)     CHF with systolic dysfuntion ; dilated nonishemic cardiomyopathy  . Cancer     1989 colon  . Carotid bruit     left  . Hypercholesteremia   . CAD (coronary artery disease)   . Female bladder prolapse   . SOB (shortness of breath)     Sometimes at night  . Epistaxis   .  Hyponatremia     Probably secondary to her CHF  . Bladder tumor     Past Surgical History  Procedure Date  . Cardiac catheterization     non obstruction CAD  . Cholecystectomy   . Bladder tact   . Colectomy     sigmoid 1989 cancer    History  Smoking status  . Never Smoker   Smokeless tobacco  . Not on file    History  Alcohol Use No    Family History  Problem Relation Age of Onset  . Heart disease Father   . Heart attack Mother   . Heart attack Father     Review of Systems: As noted in history of present illness. All other systems were reviewed and are negative.  Physical Exam: BP 130/88  Pulse 66  Ht 5\' 6"  (1.676 m)  Wt 126 lb (57.153 kg)  BMI 20.34 kg/m2 She is a pleasant elderly white female in no acute distress. Her HEENT exam is unremarkable. She has no JVD. She has a left carotid bruit. There is no thyromegaly or adenopathy. Lungs are clear.  Cardiac exam reveals a regular rate and rhythm without gallop or murmur. Abdomen is soft and nontender. She has no hepatosplenomegaly. She has no edema. Pedal pulses are good. Neurologic exam reveals he is alert and oriented x3. Cranial nerves II through XII are intact. LABORATORY DATA: ECG today demonstrates normal sinus rhythm with occasional PVC. There is LVH. There is nonspecific ST-T wave abnormality.  Assessment / Plan:

## 2011-10-24 DIAGNOSIS — C679 Malignant neoplasm of bladder, unspecified: Secondary | ICD-10-CM | POA: Diagnosis present

## 2011-11-19 ENCOUNTER — Telehealth: Payer: Self-pay | Admitting: Cardiology

## 2011-11-19 NOTE — Telephone Encounter (Signed)
New Msg: Pt calling wanting to check with Dr. Martinique if pt can proceed with chemo starting tomorrow, considering pt has CHF.  Please return pt call to discuss further.

## 2011-11-19 NOTE — Telephone Encounter (Signed)
Called stating she is to start Chemo tomorrow. Wanted to know if OK with Dr. Martinique. Per Dr. Martinique can start but not with cardiotoxic medication. Advised pt to make them aware.

## 2011-12-16 ENCOUNTER — Telehealth: Payer: Self-pay | Admitting: Physician Assistant

## 2011-12-16 NOTE — Telephone Encounter (Signed)
I received a page from [facility] re: weight loss.  No answer at [facility]. I called patient. She had recent weight gain with chemo tx.  Weight now down and edema better. No chest pain or SOB. She will call back if she has increased weight, edema or SOB. Richardson Dopp, PA-C  10:51 AM 12/16/2011

## 2011-12-18 HISTORY — PX: BLADDER SURGERY: SHX569

## 2011-12-20 DIAGNOSIS — Z5111 Encounter for antineoplastic chemotherapy: Secondary | ICD-10-CM | POA: Diagnosis not present

## 2011-12-20 DIAGNOSIS — C679 Malignant neoplasm of bladder, unspecified: Secondary | ICD-10-CM | POA: Diagnosis not present

## 2011-12-27 DIAGNOSIS — Z5111 Encounter for antineoplastic chemotherapy: Secondary | ICD-10-CM | POA: Diagnosis not present

## 2011-12-31 ENCOUNTER — Other Ambulatory Visit: Payer: Self-pay | Admitting: *Deleted

## 2011-12-31 MED ORDER — FUROSEMIDE 40 MG PO TABS: 40.0000 mg | ORAL_TABLET | Freq: Every day | ORAL | Status: DC

## 2012-01-03 DIAGNOSIS — C679 Malignant neoplasm of bladder, unspecified: Secondary | ICD-10-CM | POA: Diagnosis not present

## 2012-01-07 ENCOUNTER — Other Ambulatory Visit: Payer: Self-pay

## 2012-01-07 MED ORDER — SIMVASTATIN 20 MG PO TABS: 20.0000 mg | ORAL_TABLET | Freq: Every day | ORAL | Status: DC

## 2012-01-10 DIAGNOSIS — C679 Malignant neoplasm of bladder, unspecified: Secondary | ICD-10-CM | POA: Diagnosis not present

## 2012-01-10 DIAGNOSIS — Z5111 Encounter for antineoplastic chemotherapy: Secondary | ICD-10-CM | POA: Diagnosis not present

## 2012-01-12 DIAGNOSIS — C679 Malignant neoplasm of bladder, unspecified: Secondary | ICD-10-CM | POA: Diagnosis not present

## 2012-01-14 ENCOUNTER — Ambulatory Visit: Payer: Medicare Other | Admitting: Cardiology

## 2012-01-24 DIAGNOSIS — Z880 Allergy status to penicillin: Secondary | ICD-10-CM | POA: Diagnosis not present

## 2012-01-24 DIAGNOSIS — Z79899 Other long term (current) drug therapy: Secondary | ICD-10-CM | POA: Diagnosis not present

## 2012-01-24 DIAGNOSIS — Z7982 Long term (current) use of aspirin: Secondary | ICD-10-CM | POA: Diagnosis not present

## 2012-01-24 DIAGNOSIS — C679 Malignant neoplasm of bladder, unspecified: Secondary | ICD-10-CM | POA: Diagnosis not present

## 2012-01-24 DIAGNOSIS — Z5111 Encounter for antineoplastic chemotherapy: Secondary | ICD-10-CM | POA: Diagnosis not present

## 2012-01-26 DIAGNOSIS — D414 Neoplasm of uncertain behavior of bladder: Secondary | ICD-10-CM | POA: Diagnosis not present

## 2012-01-26 DIAGNOSIS — IMO0002 Reserved for concepts with insufficient information to code with codable children: Secondary | ICD-10-CM | POA: Diagnosis not present

## 2012-01-30 ENCOUNTER — Other Ambulatory Visit: Payer: Self-pay | Admitting: *Deleted

## 2012-01-30 MED ORDER — RAMIPRIL 5 MG PO CAPS: 5.0000 mg | ORAL_CAPSULE | Freq: Every day | ORAL | Status: DC

## 2012-01-31 DIAGNOSIS — D649 Anemia, unspecified: Secondary | ICD-10-CM | POA: Diagnosis present

## 2012-01-31 DIAGNOSIS — C679 Malignant neoplasm of bladder, unspecified: Secondary | ICD-10-CM | POA: Diagnosis not present

## 2012-01-31 DIAGNOSIS — Z5111 Encounter for antineoplastic chemotherapy: Secondary | ICD-10-CM | POA: Diagnosis not present

## 2012-02-05 ENCOUNTER — Encounter: Payer: Self-pay | Admitting: Cardiology

## 2012-02-05 ENCOUNTER — Other Ambulatory Visit: Payer: Self-pay | Admitting: Cardiology

## 2012-02-05 ENCOUNTER — Ambulatory Visit (INDEPENDENT_AMBULATORY_CARE_PROVIDER_SITE_OTHER): Payer: Medicare Other | Admitting: Cardiology

## 2012-02-05 VITALS — BP 101/61 | HR 75 | Ht 66.0 in | Wt 125.4 lb

## 2012-02-05 DIAGNOSIS — G4700 Insomnia, unspecified: Secondary | ICD-10-CM | POA: Diagnosis not present

## 2012-02-05 DIAGNOSIS — I509 Heart failure, unspecified: Secondary | ICD-10-CM

## 2012-02-05 DIAGNOSIS — R0683 Snoring: Secondary | ICD-10-CM

## 2012-02-05 DIAGNOSIS — C679 Malignant neoplasm of bladder, unspecified: Secondary | ICD-10-CM | POA: Diagnosis not present

## 2012-02-05 DIAGNOSIS — R0989 Other specified symptoms and signs involving the circulatory and respiratory systems: Secondary | ICD-10-CM | POA: Diagnosis not present

## 2012-02-05 DIAGNOSIS — R0609 Other forms of dyspnea: Secondary | ICD-10-CM

## 2012-02-05 MED ORDER — CARVEDILOL 6.25 MG PO TABS: 6.2500 mg | ORAL_TABLET | Freq: Two times a day (BID) | ORAL | Status: DC

## 2012-02-05 NOTE — Progress Notes (Signed)
Paige Cook Date of Birth: 11-09-1928   History of Present Illness: Paige Cook is seen for followup today. Since her last visit here she has undergone 7 courses of chemotherapy for bladder cancer. This has resulted in significant shrinkage of the bladder tumor. She may eventually need surgery. She does complain that she snores a lot at night and doesn't sleep well. She often awakens feeling short of breath and has a dry mouth. She is concerned that she may have apnea. As part of her treatment with chemotherapy she did develop anemia requiring transfusion. She felt much more energetic after her transfusion.  Current Outpatient Prescriptions on File Prior to Visit  Medication Sig Dispense Refill  . fish oil-omega-3 fatty acids 1000 MG capsule Take 2 g by mouth daily.        . furosemide (LASIX) 40 MG tablet Take 1 tablet (40 mg total) by mouth daily.  30 tablet  5  . glucosamine-chondroitin 500-400 MG tablet Take 1 tablet by mouth daily.        . ramipril (ALTACE) 5 MG capsule Take 1 capsule (5 mg total) by mouth daily.  30 capsule  5  . simvastatin (ZOCOR) 20 MG tablet Take 1 tablet (20 mg total) by mouth at bedtime.  30 tablet  4  . vitamin C (ASCORBIC ACID) 500 MG tablet Take 250 mg by mouth daily.       Marland Kitchen aspirin 81 MG tablet Take 81 mg by mouth daily.        Marland Kitchen conjugated estrogens (PREMARIN) vaginal cream Place vaginally daily. 2 times/week       . DISCONTD: carvedilol (COREG) 6.25 MG tablet TAKE 1 TABLET TWICE A DAY  60 tablet  5    Allergies  Allergen Reactions  . Penicillins     Past Medical History  Diagnosis Date  . CHF (congestive heart failure)     CHF with systolic dysfuntion ; dilated nonishemic cardiomyopathy  . Cancer     1989 colon  . Carotid bruit     left  . Hypercholesteremia   . CAD (coronary artery disease)   . Female bladder prolapse   . SOB (shortness of breath)     Sometimes at night  . Epistaxis   . Hyponatremia     Probably secondary to her CHF  .  Bladder tumor     Past Surgical History  Procedure Date  . Cardiac catheterization     non obstruction CAD  . Cholecystectomy   . Bladder tact   . Colectomy     sigmoid 1989 cancer    History  Smoking status  . Never Smoker   Smokeless tobacco  . Not on file    History  Alcohol Use No    Family History  Problem Relation Age of Onset  . Heart disease Father   . Heart attack Mother   . Heart attack Father     Review of Systems: As noted in history of present illness. All other systems were reviewed and are negative.  Physical Exam: BP 101/61  Pulse 75  Ht 5\' 6"  (1.676 m)  Wt 56.881 kg (125 lb 6.4 oz)  BMI 20.24 kg/m2 She is a pleasant elderly white female in no acute distress. Her HEENT exam is unremarkable. She has no JVD. She has a left carotid bruit. There is no thyromegaly or adenopathy. Lungs are clear. Cardiac exam reveals a regular rate and rhythm without gallop or murmur. Abdomen is soft and nontender.  She has no hepatosplenomegaly. She has no edema. Pedal pulses are good. Neurologic exam reveals he is alert and oriented x3. Cranial nerves II through XII are intact. LABORATORY DATA:   Assessment / Plan:

## 2012-02-05 NOTE — Assessment & Plan Note (Signed)
Her last ejection fraction was 40% by echocardiogram in June of 2012. She is really asymptomatic at this point. I am concerned about the potential for sleep apnea given her symptoms. We will screen her with overnight oximetry. I recommended continuing on carvedilol, ACE inhibitor, and Lasix. I will followup again in 6 months. If she does require bladder surgery she will be cleared.

## 2012-02-05 NOTE — Patient Instructions (Addendum)
We will arrange for you to have home oximetry testing.  [facility] will contact you.  If you have not heard form them by  Thursday, call them at [phone].  Continue your current medications.  I will see you again in 6 months.

## 2012-02-07 DIAGNOSIS — Z5111 Encounter for antineoplastic chemotherapy: Secondary | ICD-10-CM | POA: Diagnosis not present

## 2012-02-07 DIAGNOSIS — D649 Anemia, unspecified: Secondary | ICD-10-CM | POA: Diagnosis not present

## 2012-02-07 DIAGNOSIS — C679 Malignant neoplasm of bladder, unspecified: Secondary | ICD-10-CM | POA: Diagnosis not present

## 2012-02-08 ENCOUNTER — Telehealth: Payer: Self-pay | Admitting: Cardiology

## 2012-02-08 NOTE — Telephone Encounter (Signed)
[facility] was called and told need to refax order form never received copy.

## 2012-02-08 NOTE — Telephone Encounter (Signed)
Estill Bamberg with [facility] calling re status of fax on order of oxsymmetry and can't place order until has the form signed

## 2012-02-12 ENCOUNTER — Encounter: Payer: Self-pay | Admitting: Cardiology

## 2012-02-12 DIAGNOSIS — R0609 Other forms of dyspnea: Secondary | ICD-10-CM | POA: Diagnosis not present

## 2012-02-12 DIAGNOSIS — R0989 Other specified symptoms and signs involving the circulatory and respiratory systems: Secondary | ICD-10-CM | POA: Diagnosis not present

## 2012-02-12 NOTE — Telephone Encounter (Signed)
2 forms faxed back to [facility].

## 2012-02-13 ENCOUNTER — Telehealth: Payer: Self-pay | Admitting: Cardiology

## 2012-02-13 DIAGNOSIS — I2510 Atherosclerotic heart disease of native coronary artery without angina pectoris: Secondary | ICD-10-CM

## 2012-02-13 NOTE — Telephone Encounter (Signed)
New problem:   Oxygen sats was 85 % .   Need an order for night time . Will be faxing over report

## 2012-02-13 NOTE — Telephone Encounter (Signed)
Mandy at [facility] was called,was told Dr.Jordan recommends patient to sleep study.Patient was called and told ,schedulers will be calling to schedule.

## 2012-02-15 DIAGNOSIS — C679 Malignant neoplasm of bladder, unspecified: Secondary | ICD-10-CM | POA: Diagnosis not present

## 2012-03-04 ENCOUNTER — Encounter (HOSPITAL_BASED_OUTPATIENT_CLINIC_OR_DEPARTMENT_OTHER): Payer: Medicare Other

## 2012-03-07 ENCOUNTER — Ambulatory Visit (HOSPITAL_BASED_OUTPATIENT_CLINIC_OR_DEPARTMENT_OTHER): Payer: Medicare Other | Attending: Cardiology | Admitting: Radiology

## 2012-03-07 VITALS — Ht 66.0 in | Wt 120.0 lb

## 2012-03-07 DIAGNOSIS — G4733 Obstructive sleep apnea (adult) (pediatric): Secondary | ICD-10-CM | POA: Insufficient documentation

## 2012-03-07 DIAGNOSIS — I2510 Atherosclerotic heart disease of native coronary artery without angina pectoris: Secondary | ICD-10-CM

## 2012-03-08 DIAGNOSIS — G4733 Obstructive sleep apnea (adult) (pediatric): Secondary | ICD-10-CM

## 2012-03-08 DIAGNOSIS — G4761 Periodic limb movement disorder: Secondary | ICD-10-CM

## 2012-03-08 DIAGNOSIS — R0609 Other forms of dyspnea: Secondary | ICD-10-CM

## 2012-03-08 DIAGNOSIS — R0989 Other specified symptoms and signs involving the circulatory and respiratory systems: Secondary | ICD-10-CM | POA: Diagnosis not present

## 2012-03-08 NOTE — Progress Notes (Signed)
NPSG ordered and performed.  Respiratory events occurred in both lateral and supine positions and were both apnea and hypopneas.

## 2012-03-10 NOTE — Procedures (Signed)
[name], [name]                ACCOUNT NO.:  [id number]  MEDICAL RECORD NO.:  [id number]         PATIENT TYPE:  OUT  LOCATION:  SLEEP CENTER                 FACILITY:  [facility]  PHYSICIAN:  Paquita Printy D. Annamaria Boots, MD, FCCP, FACPDATE OF BIRTH:  DATE OF STUDY:  03/07/2012                           NOCTURNAL POLYSOMNOGRAM  REFERRING PHYSICIAN:  Peter M. Martinique, M.D.  INDICATION FOR STUDY:  Insomnia with sleep apnea.  EPWORTH SLEEPINESS SCORE:  4/24.  BMI 19.  Weight 120 pounds.  Height 66 inches.  Neck 13 inches.  MEDICATIONS:  Home medications are charted and reviewed.  SLEEP ARCHITECTURE:  Total sleep time 278 minutes with sleep efficiency 67.6%.  Stage I was 13.7%, stage II 49.1%, stage III 15.1%, REM 22.1% of total sleep time.  Sleep latency 23 minutes.  REM latency 141 minutes. Awake after sleep onset 109.5 minutes.  Arousal index of 30.  BEDTIME MEDICATION:  None.  RESPIRATORY DATA:  Apnea-hypopnea index (AHI) 17.9 per hour.  A total of 83 events was scored including 27 obstructive apneas, 1 central apnea, 55 hypopneas.  Most events were associated with supine sleep position. REM/AHI 13.7 per hour.  This was ordered as a diagnostic NPSG protocol and CPAP titration was not done.  OXYGEN DATA:  Moderately loud snoring with oxygen desaturation to a nadir of 81% and mean oxygen saturation through the study of 92.4% on room air.  CARDIAC DATA:  Sinus rhythm with PACs and PVCs.  MOVEMENT-PARASOMNIA:  A total of 71 limb jerks were counted of which 32 were associated with arousal or awakening for periodic limb movement with arousal index of 6.9 per hour.  Bathroom x1.  IMPRESSIONS-RECOMMENDATIONS: 1. Sleep architecture, marked by significant fragmentation with     intervals of sustained wakefulness until nearly 2 a.m.  No bedtime     medication taken. 2. Moderate obstructive sleep apnea/hypopnea syndrome, AHI 17.9 per     hour.  Most events were associated with supine sleep  position.     Moderately loud snoring with oxygen desaturation to a nadir of 81%     and mean oxygen saturation through the study of 92.4% on room air.     This is a diagnostic NPSG protocol as ordered.  CPAP titration was     not done. 3. Room-air saturation on arrival was 94%.  During the total recording     time, 36.3 minutes was recorded with saturation less than 90% and     15.6 minutes was recorded with oxygen saturation less than 88% on     room air. 4. Periodic limb movement with arousal syndrome.  A total of 71 limb     jerks were counted of which 32 were associated with arousal or     Awakening, for periodic limb movement with arousal index of 6.9 per     hour.     Missey Hasley D. Annamaria Boots, MD, [facility], [place], [place] Board of Sleep Medicine    CDY/MEDQ  D:  03/08/2012 12:20:10  T:  03/08/2012 12:54:16  Job:  [id number]

## 2012-03-12 ENCOUNTER — Telehealth: Payer: Self-pay | Admitting: Cardiology

## 2012-03-12 DIAGNOSIS — G4730 Sleep apnea, unspecified: Secondary | ICD-10-CM

## 2012-03-12 NOTE — Telephone Encounter (Signed)
Please return call to patient at [phone]   Patient would like to speak with nurse regarding info for upcoming [facility] Appnt, she can be reached at [phone]

## 2012-03-13 NOTE — Telephone Encounter (Signed)
Patient called was told received sleep study report which shows sleep apnea.Dr.Jordan advised needs to see pulmonology .Schedulers will be calling to give appointment.

## 2012-03-13 NOTE — Telephone Encounter (Signed)
Patient requested copy of sleep study be faxed to [facility].Copy faxed to [phone].

## 2012-03-14 DIAGNOSIS — Z01810 Encounter for preprocedural cardiovascular examination: Secondary | ICD-10-CM | POA: Diagnosis not present

## 2012-03-14 DIAGNOSIS — Z79899 Other long term (current) drug therapy: Secondary | ICD-10-CM | POA: Diagnosis not present

## 2012-03-14 DIAGNOSIS — C679 Malignant neoplasm of bladder, unspecified: Secondary | ICD-10-CM | POA: Diagnosis not present

## 2012-03-14 DIAGNOSIS — R233 Spontaneous ecchymoses: Secondary | ICD-10-CM | POA: Diagnosis not present

## 2012-03-14 DIAGNOSIS — Z01818 Encounter for other preprocedural examination: Secondary | ICD-10-CM | POA: Diagnosis not present

## 2012-03-19 ENCOUNTER — Telehealth: Payer: Self-pay | Admitting: Cardiology

## 2012-03-19 NOTE — Telephone Encounter (Signed)
LOV,12,Doppler,Echo faxed to [facility] @  [phone] 03/19/12/Km

## 2012-03-20 NOTE — Telephone Encounter (Signed)
LOV,12,Doppler,Echo,Refaxed to [facility]  @ [phone] 03/20/12/KM

## 2012-03-24 DIAGNOSIS — G4730 Sleep apnea, unspecified: Secondary | ICD-10-CM | POA: Diagnosis present

## 2012-03-24 DIAGNOSIS — I6529 Occlusion and stenosis of unspecified carotid artery: Secondary | ICD-10-CM | POA: Diagnosis present

## 2012-03-24 DIAGNOSIS — C679 Malignant neoplasm of bladder, unspecified: Secondary | ICD-10-CM | POA: Diagnosis present

## 2012-03-24 DIAGNOSIS — Z9221 Personal history of antineoplastic chemotherapy: Secondary | ICD-10-CM | POA: Diagnosis not present

## 2012-03-24 DIAGNOSIS — I509 Heart failure, unspecified: Secondary | ICD-10-CM | POA: Diagnosis present

## 2012-03-24 DIAGNOSIS — I359 Nonrheumatic aortic valve disorder, unspecified: Secondary | ICD-10-CM | POA: Diagnosis present

## 2012-03-24 DIAGNOSIS — I658 Occlusion and stenosis of other precerebral arteries: Secondary | ICD-10-CM | POA: Diagnosis present

## 2012-03-24 DIAGNOSIS — D62 Acute posthemorrhagic anemia: Secondary | ICD-10-CM | POA: Diagnosis not present

## 2012-03-24 DIAGNOSIS — G8918 Other acute postprocedural pain: Secondary | ICD-10-CM | POA: Diagnosis not present

## 2012-04-10 ENCOUNTER — Institutional Professional Consult (permissible substitution): Payer: Medicare Other | Admitting: Pulmonary Disease

## 2012-04-18 DIAGNOSIS — C679 Malignant neoplasm of bladder, unspecified: Secondary | ICD-10-CM | POA: Diagnosis not present

## 2012-04-29 ENCOUNTER — Ambulatory Visit (INDEPENDENT_AMBULATORY_CARE_PROVIDER_SITE_OTHER): Payer: Medicare Other | Admitting: Pulmonary Disease

## 2012-04-29 ENCOUNTER — Encounter: Payer: Self-pay | Admitting: Pulmonary Disease

## 2012-04-29 VITALS — BP 104/62 | HR 73 | Temp 98.7°F | Ht 66.0 in | Wt 122.8 lb

## 2012-04-29 DIAGNOSIS — G4733 Obstructive sleep apnea (adult) (pediatric): Secondary | ICD-10-CM

## 2012-04-29 HISTORY — DX: Obstructive sleep apnea (adult) (pediatric): G47.33

## 2012-04-29 NOTE — Progress Notes (Deleted)
  Subjective:    Patient ID: Paige Cook, female    DOB: 1928/07/09, 78 y.o.   MRN: [id number]  HPI    Review of Systems  Constitutional: Positive for unexpected weight change. Negative for fever and appetite change.  HENT: Negative for ear pain, congestion, sore throat, sneezing, trouble swallowing and dental problem.   Respiratory: Positive for shortness of breath. Negative for cough.   Cardiovascular: Negative for chest pain, palpitations and leg swelling.  Gastrointestinal: Negative for abdominal pain.  Musculoskeletal: Negative for joint swelling.  Skin: Negative for rash.  Neurological: Negative for headaches.  Psychiatric/Behavioral: Negative for dysphoric mood. The patient is not nervous/anxious.        Objective:   Physical Exam        Assessment & Plan:

## 2012-04-29 NOTE — Patient Instructions (Signed)
Will arrange for CPAP set up at home Follow up in 6 to 8 weeks

## 2012-04-29 NOTE — Progress Notes (Signed)
Chief Complaint  Patient presents with  . sleep consult    Pt had sleep study 3/013--told she had OSA. Pt has not been set up on cpap machine.    History of Present Illness: Paige Cook is a 78 y.o. female for evaluation of OSA.  She is followed by Dr. Martinique for CAD and CHF.  During recent evaluation there was concern for sleep apnea.  She had sleep study from 03/07/12.  This showed moderate obstructive sleep apnea with AHI 17.9 and SpO2 low of 81%.  The vast majority of her events were obstructive.  She was therefore referred to pulmonary for further therapy.  She has noticed trouble with her sleep for some time.  She snores, and has trouble sleeping on her back.  She will wake up feeling like she can't breath.  She denies dental problems or TMJ.  She goes to bed at 10 pm.  She falls asleep quickly.  She does not use anything to help sleep.  She wakes up several times to use the bathroom.  This is more so since she had surgery for bladder cancer.  She gets out of bed at 8 am.  She feels okay in the morning.  She does not usually take naps.  She is not using anything to help her stay awake.  Epworth score is 4 out of 24.  The patient denies sleep walking, sleep talking, bruxism, or nightmares.  There is no history of restless legs.  The patient denies sleep hallucinations, sleep paralysis, or cataplexy.   Past Medical History  Diagnosis Date  . CHF (congestive heart failure)     CHF with systolic dysfuntion ; dilated nonishemic cardiomyopathy  . Cancer     1989 colon  . Carotid bruit     left  . Hypercholesteremia   . CAD (coronary artery disease)   . Female bladder prolapse   . SOB (shortness of breath)     Sometimes at night  . Epistaxis   . Hyponatremia     Probably secondary to her CHF  . Bladder tumor     Past Surgical History  Procedure Date  . Cardiac catheterization     non obstruction CAD  . Cholecystectomy   . Bladder tact   . Colectomy     sigmoid 1989  cancer  . Abdominal hysterectomy   . Bladder surgery     Current Outpatient Prescriptions on File Prior to Visit  Medication Sig Dispense Refill  . aspirin 81 MG tablet Take 81 mg by mouth daily.        . calcium-vitamin D (OSCAL WITH D) 500-200 MG-UNIT per tablet Take 1 tablet by mouth daily.      . carvedilol (COREG) 6.25 MG tablet Take 1 tablet (6.25 mg total) by mouth 2 (two) times daily with a meal.  60 tablet  5  . conjugated estrogens (PREMARIN) vaginal cream Place vaginally daily. 2 times/week       . fish oil-omega-3 fatty acids 1000 MG capsule Take 2 g by mouth daily.        . furosemide (LASIX) 40 MG tablet Take 1 tablet (40 mg total) by mouth daily.  30 tablet  5  . glucosamine-chondroitin 500-400 MG tablet Take 1 tablet by mouth daily.        . ramipril (ALTACE) 5 MG capsule Take 1 capsule (5 mg total) by mouth daily.  30 capsule  5  . simvastatin (ZOCOR) 20 MG tablet Take 1 tablet (20  mg total) by mouth at bedtime.  30 tablet  4  . vitamin C (ASCORBIC ACID) 500 MG tablet Take 250 mg by mouth daily.         Allergies  Allergen Reactions  . Penicillins     family history includes Heart attack in her father and mother and Heart disease in her father.   reports that she has never smoked. She does not have any smokeless tobacco history on file. She reports that she does not drink alcohol or use illicit drugs.  Review of Systems  Constitutional: Positive for unexpected weight change. Negative for fever and appetite change.  HENT: Negative for ear pain, congestion, sore throat, sneezing, trouble swallowing and dental problem.   Respiratory: Positive for shortness of breath. Negative for cough.   Cardiovascular: Negative for chest pain, palpitations and leg swelling.  Gastrointestinal: Negative for abdominal pain.  Musculoskeletal: Negative for joint swelling.  Skin: Negative for rash.  Neurological: Negative for headaches.  Psychiatric/Behavioral: Negative for dysphoric  mood. The patient is not nervous/anxious.     Physical Exam: BP 104/62  Pulse 73  Temp(Src) 98.7 F (37.1 C) (Oral)  Ht 5\' 6"  (1.676 m)  Wt 122 lb 12.8 oz (55.702 kg)  BMI 19.82 kg/m2  SpO2 94%  Body mass index is 19.82 kg/(m^2).   General - Thin HEENT - PERRLA, EOMI, no sinus tenderness, no oral exudate, retrognathic, no LAN Cardiac - s1s2 regular, no murmur Chest - no wheeze/rales Abdomen - soft, nontender Extremities - no e/c/c Neurologic - normal strength, CN intact Skin - no rashes Psychiatric - normal mood, behavior  Assessment/Plan:  Outpatient Encounter Prescriptions as of 04/29/2012  Medication Sig Dispense Refill  . aspirin 81 MG tablet Take 81 mg by mouth daily.        . calcium-vitamin D (OSCAL WITH D) 500-200 MG-UNIT per tablet Take 1 tablet by mouth daily.      . carvedilol (COREG) 6.25 MG tablet Take 1 tablet (6.25 mg total) by mouth 2 (two) times daily with a meal.  60 tablet  5  . conjugated estrogens (PREMARIN) vaginal cream Place vaginally daily. 2 times/week       . fish oil-omega-3 fatty acids 1000 MG capsule Take 2 g by mouth daily.        . furosemide (LASIX) 40 MG tablet Take 1 tablet (40 mg total) by mouth daily.  30 tablet  5  . glucosamine-chondroitin 500-400 MG tablet Take 1 tablet by mouth daily.        . ramipril (ALTACE) 5 MG capsule Take 1 capsule (5 mg total) by mouth daily.  30 capsule  5  . simvastatin (ZOCOR) 20 MG tablet Take 1 tablet (20 mg total) by mouth at bedtime.  30 tablet  4  . vitamin C (ASCORBIC ACID) 500 MG tablet Take 250 mg by mouth daily.         Blane Worthington Pager:  [phone] 04/29/2012, 11:27 AM

## 2012-04-29 NOTE — Assessment & Plan Note (Signed)
She has moderate sleep apnea.  She has history of coronary artery disease and congestive heart failure.  I have reviewed her sleep test results with the patient.  Explained how sleep apnea can affect the patient's health.  Driving precautions and importance of weight loss were discussed.  Treatment options for sleep apnea were reviewed.  Will arrange for auto CPAP at home.  If this is unsuccessful then she will need in lab titration.  She may also consider oral appliance if CPAP doesn't work for her.

## 2012-06-04 ENCOUNTER — Other Ambulatory Visit: Payer: Self-pay | Admitting: Pulmonary Disease

## 2012-06-04 ENCOUNTER — Telehealth: Payer: Self-pay | Admitting: Pulmonary Disease

## 2012-06-04 DIAGNOSIS — G4733 Obstructive sleep apnea (adult) (pediatric): Secondary | ICD-10-CM

## 2012-06-04 NOTE — Telephone Encounter (Signed)
VS pt-VS is out of the office(off according to schedule) until Monday 06-09-12; [place] as doc of day please advise. Thanks.    Pt called to let us know she is only able to wear her CPAP approximately 2 hours each night; she is waking up with gasping for air feelings and dry mouth. I suggested Biotene OTC to help with dry mouth; however patient states she is concerned about the pressure on CPAP. Pt also stated she does not want to wait until Monday for VS to advise. [facility] had patient to increase her humidifier to 3 but told patient we would have to give order to increase to 5 and change CPAP pressure.

## 2012-06-04 NOTE — Telephone Encounter (Signed)
Let pt know that she has complete control of her heater setting on the humidifier.  She can raise and lower based on her dryness.  If dry, increase heat, if too wet, decrease heat.  If she is using a nasal mask or pillows, may be having issues with mouth opening during the night.  Let us know if she thinks she is opening her mouth, and we can get full face if she doesn;t already have one.  Will send an order to dme to decrease  Pressure.

## 2012-06-04 NOTE — Telephone Encounter (Signed)
Please call patient after 330 pm

## 2012-06-05 ENCOUNTER — Other Ambulatory Visit: Payer: Self-pay | Admitting: *Deleted

## 2012-06-05 DIAGNOSIS — I6529 Occlusion and stenosis of unspecified carotid artery: Secondary | ICD-10-CM

## 2012-06-05 NOTE — Telephone Encounter (Signed)
Pt aware of [employer] recs. She states she has already received a call from her dme company about her pressure being decreased. Nothing further was needed

## 2012-06-05 NOTE — Telephone Encounter (Signed)
Pt returned call.  Paige Cook ° °

## 2012-06-05 NOTE — Telephone Encounter (Signed)
lmomtcb x1 

## 2012-06-06 ENCOUNTER — Encounter (INDEPENDENT_AMBULATORY_CARE_PROVIDER_SITE_OTHER): Payer: Medicare Other

## 2012-06-06 DIAGNOSIS — I6529 Occlusion and stenosis of unspecified carotid artery: Secondary | ICD-10-CM | POA: Diagnosis not present

## 2012-06-14 ENCOUNTER — Encounter: Payer: Self-pay | Admitting: Pulmonary Disease

## 2012-06-16 ENCOUNTER — Ambulatory Visit (INDEPENDENT_AMBULATORY_CARE_PROVIDER_SITE_OTHER): Payer: Medicare Other | Admitting: Pulmonary Disease

## 2012-06-16 ENCOUNTER — Encounter: Payer: Self-pay | Admitting: Pulmonary Disease

## 2012-06-16 VITALS — BP 126/60 | HR 82 | Temp 98.4°F | Ht 66.0 in | Wt 125.6 lb

## 2012-06-16 DIAGNOSIS — G4733 Obstructive sleep apnea (adult) (pediatric): Secondary | ICD-10-CM

## 2012-06-16 NOTE — Assessment & Plan Note (Signed)
She has done better since getting new mask.  With this change she is compliant with CPAP and reports benefit.  Will adjust her CPAP to 8 cm H2O.

## 2012-06-16 NOTE — Progress Notes (Signed)
Chief Complaint  Patient presents with  . Follow-up    Patient has been using CPAP machine uses machine approx. 6-7hrs with nose piece,denies any other s/s    History of Present Illness: Paige Cook is a 77 y.o. female with OSA.  Auto CPAP 05/13/12 to 06/13/12>>Used on 27 of 30 nights with average 2 hrs 9 min.  Average AHI 8 with median CPAP 9 cm H2O and 95th percentile CPAP 11 cm H2O.  She had problems initially with set up due to mask fit, mouth/sinus dryness, and pressure.  She has done better since she switched to nasal pillow, and decrease in pressure setting.  She is using nasal saline spray nightly, and this helps also.  She feels CPAP has helped her sleep and energy level.  She is now able to use CPAP for 6 to 7 hours per night.    Past Medical History  Diagnosis Date  . CHF (congestive heart failure)     CHF with systolic dysfuntion ; dilated nonishemic cardiomyopathy  . Cancer     1989 colon  . Carotid bruit     left  . Hypercholesteremia   . CAD (coronary artery disease)   . Female bladder prolapse   . SOB (shortness of breath)     Sometimes at night  . Epistaxis   . Hyponatremia     Probably secondary to her CHF  . Bladder tumor   . OSA (obstructive sleep apnea) 04/29/2012    Past Surgical History  Procedure Date  . Cardiac catheterization     non obstruction CAD  . Cholecystectomy   . Bladder tact   . Colectomy     sigmoid 1989 cancer  . Abdominal hysterectomy   . Bladder surgery     Physical Exam: BP 126/60  Pulse 82  Temp 98.4 F (36.9 C) (Oral)  Ht 5\' 6"  (1.676 m)  Wt 125 lb 9.6 oz (56.972 kg)  BMI 20.27 kg/m2  SpO2 96%  Body mass index is 20.27 kg/(m^2).   General - Thin HEENT - PERRLA, EOMI, no sinus tenderness, no oral exudate, retrognathic, no LAN Cardiac - s1s2 regular, no murmur Chest - no wheeze/rales Abdomen - soft, nontender Extremities - no e/c/c Neurologic - normal strength, CN intact Skin - no rashes Psychiatric -  normal mood, behavior  Assessment/Plan:  Outpatient Encounter Prescriptions as of 06/16/2012  Medication Sig Dispense Refill  . aspirin 81 MG tablet Take 81 mg by mouth daily.        . calcium-vitamin D (OSCAL WITH D) 500-200 MG-UNIT per tablet Take 1 tablet by mouth daily.      . carvedilol (COREG) 6.25 MG tablet Take 1 tablet (6.25 mg total) by mouth 2 (two) times daily with a meal.  60 tablet  5  . conjugated estrogens (PREMARIN) vaginal cream Place vaginally daily. 2 times/week       . fish oil-omega-3 fatty acids 1000 MG capsule Take 2 g by mouth daily.        . furosemide (LASIX) 40 MG tablet Take 1 tablet (40 mg total) by mouth daily.  30 tablet  5  . glucosamine-chondroitin 500-400 MG tablet Take 1 tablet by mouth daily.        . ramipril (ALTACE) 5 MG capsule Take 1 capsule (5 mg total) by mouth daily.  30 capsule  5  . simvastatin (ZOCOR) 20 MG tablet Take 1 tablet (20 mg total) by mouth at bedtime.  30 tablet  4  .  vitamin C (ASCORBIC ACID) 500 MG tablet Take 250 mg by mouth daily.         Paige Cook Pager:  [phone] 06/16/2012, 2:48 PM

## 2012-06-16 NOTE — Patient Instructions (Signed)
Follow up in 4 months 

## 2012-07-07 ENCOUNTER — Other Ambulatory Visit: Payer: Self-pay | Admitting: Cardiology

## 2012-07-07 MED ORDER — FUROSEMIDE 40 MG PO TABS: 40.0000 mg | ORAL_TABLET | Freq: Every day | ORAL | Status: DC

## 2012-07-07 MED ORDER — SIMVASTATIN 20 MG PO TABS: 20.0000 mg | ORAL_TABLET | Freq: Every day | ORAL | Status: DC

## 2012-07-09 ENCOUNTER — Other Ambulatory Visit: Payer: Self-pay | Admitting: *Deleted

## 2012-07-09 MED ORDER — CARVEDILOL 6.25 MG PO TABS: 6.2500 mg | ORAL_TABLET | Freq: Two times a day (BID) | ORAL | Status: DC

## 2012-07-23 DIAGNOSIS — C677 Malignant neoplasm of urachus: Secondary | ICD-10-CM | POA: Diagnosis not present

## 2012-07-23 DIAGNOSIS — Z906 Acquired absence of other parts of urinary tract: Secondary | ICD-10-CM | POA: Diagnosis not present

## 2012-07-23 DIAGNOSIS — C679 Malignant neoplasm of bladder, unspecified: Secondary | ICD-10-CM | POA: Diagnosis not present

## 2012-07-23 DIAGNOSIS — R935 Abnormal findings on diagnostic imaging of other abdominal regions, including retroperitoneum: Secondary | ICD-10-CM | POA: Diagnosis not present

## 2012-07-25 ENCOUNTER — Telehealth: Payer: Self-pay | Admitting: *Deleted

## 2012-07-25 ENCOUNTER — Encounter: Payer: Self-pay | Admitting: Cardiology

## 2012-07-25 ENCOUNTER — Ambulatory Visit (INDEPENDENT_AMBULATORY_CARE_PROVIDER_SITE_OTHER): Payer: Medicare Other | Admitting: Nurse Practitioner

## 2012-07-25 ENCOUNTER — Encounter: Payer: Self-pay | Admitting: Nurse Practitioner

## 2012-07-25 VITALS — BP 146/58 | HR 72 | Ht 66.0 in | Wt 124.4 lb

## 2012-07-25 DIAGNOSIS — R002 Palpitations: Secondary | ICD-10-CM | POA: Diagnosis not present

## 2012-07-25 DIAGNOSIS — I2510 Atherosclerotic heart disease of native coronary artery without angina pectoris: Secondary | ICD-10-CM

## 2012-07-25 DIAGNOSIS — I509 Heart failure, unspecified: Secondary | ICD-10-CM

## 2012-07-25 LAB — BASIC METABOLIC PANEL
BUN: 42 mg/dL — ABNORMAL HIGH (ref 6–23)
CO2: 31 mEq/L (ref 19–32)
Calcium: 9.3 mg/dL (ref 8.4–10.5)
Chloride: 97 mEq/L (ref 96–112)
Creatinine, Ser: 1.4 mg/dL — ABNORMAL HIGH (ref 0.4–1.2)
GFR: 38.25 mL/min — ABNORMAL LOW (ref >60.00)
Glucose, Bld: 114 mg/dL — ABNORMAL HIGH (ref 70–99)
Potassium: 5.1 mEq/L (ref 3.5–5.1)
Sodium: 136 mEq/L (ref 135–145)

## 2012-07-25 LAB — TSH: TSH: 0.82 u[IU]/mL (ref 0.35–5.50)

## 2012-07-25 NOTE — Assessment & Plan Note (Signed)
EF has improved per last echo back in June of 2012. She seems to be asymptomatic. We will go ahead and update.

## 2012-07-25 NOTE — Assessment & Plan Note (Signed)
She is having bigeminy PAC's. She is not symptomatic. She is worried about her EF. Will place a Holter for 24 hours. Will update her echo. We will check some labs today as well. I have left her on her current dose of Coreg for now, but we may need to increase. She already has a follow up visit with Dr. Martinique for later this month. Patient is agreeable to this plan and will call if any problems develop in the interim.

## 2012-07-25 NOTE — Telephone Encounter (Signed)
Called patient to give lab results.  Patient reports that she just had a cystoscopy a couple of days ago and due to the contrast she had to push fluids.  She then took 2 of her furosemide after the whole process was over and gained 3 lbs. She then decreased her dose back to 1 tablet, but feels like all of this medicine and volume overload could have caused these values.  She will re-check on return visit. Concepcion Living, CMA

## 2012-07-25 NOTE — Assessment & Plan Note (Signed)
No chest pain reported 

## 2012-07-25 NOTE — Patient Instructions (Addendum)
We are going to check labs today  We are going to update your ultrasound of your heart  We are going to place a Holter monitor for 24 hours.  For now, stay on your current medicines.  Keep your appointment with Dr. Martinique for later this month.  Call the [facility] office at [phone] if you have any questions, problems or concerns.

## 2012-07-25 NOTE — Telephone Encounter (Signed)
Message copied by Iona Hansen on Fri Jul 25, 2012  4:51 PM ------      Message from: Burtis Junes      Created: Fri Jul 25, 2012  1:37 PM       Ok to report. Labs are satisfactory. Some mild renal insufficiency. Recheck BMET on her return visit.

## 2012-07-25 NOTE — Addendum Note (Signed)
Addended by: Concepcion Living D on: 07/25/2012 11:25 AM   Modules accepted: Orders

## 2012-07-25 NOTE — Progress Notes (Signed)
Paige Cook Date of Birth: 1928/07/04 Medical Record [id number]  History of Present Illness: [name] is seen back today for a work in visit. She is seen for Dr. Martinique. She is a very pleasant 78 year old female with a history bladdder cancer, nonobstructive CAD with an EF that has improved from 25% to 40%.   She comes in today. She is here alone. She thought she was doing ok. She has had her bladder surgery and had a good check up at [facility] yesterday. She denies chest pain or SOB. No dizziness or lightheadedness. No syncope. She is walking a mile a day over the last month and feels good. She randomly checked her pulse a week or so ago and noticed it skipping and very irregular. She is not symptomatic. She does not use that much caffeine. Only has one cup of coffee per day. Has been eating some chocolate. She is worried about her EF.   Current Outpatient Prescriptions on File Prior to Visit  Medication Sig Dispense Refill  . aspirin 81 MG tablet Take 81 mg by mouth daily.        . calcium-vitamin D (OSCAL WITH D) 500-200 MG-UNIT per tablet Take 1 tablet by mouth daily.      . carvedilol (COREG) 6.25 MG tablet Take 1 tablet (6.25 mg total) by mouth 2 (two) times daily with a meal.  60 tablet  11  . conjugated estrogens (PREMARIN) vaginal cream Place vaginally daily. 2 times/week       . fish oil-omega-3 fatty acids 1000 MG capsule Take 2 g by mouth daily.        . furosemide (LASIX) 40 MG tablet Take 1 tablet (40 mg total) by mouth daily.  30 tablet  5  . glucosamine-chondroitin 500-400 MG tablet Take 1 tablet by mouth daily.        . ramipril (ALTACE) 5 MG capsule Take 1 capsule (5 mg total) by mouth daily.  30 capsule  5  . simvastatin (ZOCOR) 20 MG tablet Take 1 tablet (20 mg total) by mouth at bedtime.  30 tablet  5  . vitamin C (ASCORBIC ACID) 500 MG tablet Take 250 mg by mouth daily.         Allergies  Allergen Reactions  . Penicillins     Past Medical History  Diagnosis Date  .  CHF (congestive heart failure)     CHF with systolic dysfuntion ; dilated nonishemic cardiomyopathy  . Cancer     1989 colon  . Carotid bruit     left  . Hypercholesteremia   . CAD (coronary artery disease)   . Female bladder prolapse   . SOB (shortness of breath)     Sometimes at night  . Epistaxis   . Hyponatremia     Probably secondary to her CHF  . Bladder tumor   . OSA (obstructive sleep apnea) 04/29/2012    Past Surgical History  Procedure Date  . Cardiac catheterization     non obstruction CAD  . Cholecystectomy   . Bladder tact   . Colectomy     sigmoid 1989 cancer  . Abdominal hysterectomy   . Bladder surgery     History  Smoking status  . Never Smoker   Smokeless tobacco  . Never Used    History  Alcohol Use No    Family History  Problem Relation Age of Onset  . Heart disease Father   . Heart attack Mother   .  Heart attack Father     Review of Systems: The review of systems is per the HPI.  All other systems were reviewed and are negative.  Physical Exam: BP 146/58  Pulse 72  Ht 5\' 6"  (1.676 m)  Wt 124 lb 6.4 oz (56.427 kg)  BMI 20.08 kg/m2 Patient is very pleasant and in no acute distress. Skin is warm and dry. Color is normal.  HEENT is unremarkable. Normocephalic/atraumatic. PERRL. Sclera are nonicteric. Neck is supple. No masses. No JVD. Lungs are clear. Cardiac exam shows a regular rate and rhythm. She does have frequent ectopics. Abdomen is soft. Extremities are without edema. Gait and ROM are intact. No gross neurologic deficits noted.   LABORATORY DATA: EKG today shows sinus with bigeminy PAC's.    Assessment / Plan:

## 2012-07-31 ENCOUNTER — Ambulatory Visit (HOSPITAL_COMMUNITY): Payer: Medicare Other | Attending: Cardiology

## 2012-07-31 DIAGNOSIS — I2510 Atherosclerotic heart disease of native coronary artery without angina pectoris: Secondary | ICD-10-CM | POA: Diagnosis not present

## 2012-07-31 DIAGNOSIS — I509 Heart failure, unspecified: Secondary | ICD-10-CM | POA: Insufficient documentation

## 2012-07-31 DIAGNOSIS — E785 Hyperlipidemia, unspecified: Secondary | ICD-10-CM | POA: Insufficient documentation

## 2012-07-31 DIAGNOSIS — I079 Rheumatic tricuspid valve disease, unspecified: Secondary | ICD-10-CM | POA: Diagnosis not present

## 2012-07-31 DIAGNOSIS — I359 Nonrheumatic aortic valve disorder, unspecified: Secondary | ICD-10-CM | POA: Diagnosis not present

## 2012-07-31 DIAGNOSIS — I369 Nonrheumatic tricuspid valve disorder, unspecified: Secondary | ICD-10-CM

## 2012-07-31 DIAGNOSIS — R002 Palpitations: Secondary | ICD-10-CM | POA: Diagnosis not present

## 2012-07-31 NOTE — Progress Notes (Signed)
Echocardiogram performed.  

## 2012-08-06 ENCOUNTER — Telehealth: Payer: Self-pay | Admitting: Cardiology

## 2012-08-06 MED ORDER — RAMIPRIL 5 MG PO CAPS: 5.0000 mg | ORAL_CAPSULE | Freq: Every day | ORAL | Status: DC

## 2012-08-06 MED ORDER — CARVEDILOL 6.25 MG PO TABS: 6.2500 mg | ORAL_TABLET | Freq: Two times a day (BID) | ORAL | Status: DC

## 2012-08-06 NOTE — Telephone Encounter (Signed)
Patient called was given echo results.

## 2012-08-06 NOTE — Telephone Encounter (Signed)
Pt rtn call from cheryl yesterday, pls call [phone]

## 2012-08-07 ENCOUNTER — Telehealth: Payer: Self-pay | Admitting: Cardiology

## 2012-08-07 NOTE — Telephone Encounter (Signed)
Please return call to patient [phone], regarding heart monitor results

## 2012-08-07 NOTE — Telephone Encounter (Signed)
Fu call Pt calling back again

## 2012-08-07 NOTE — Telephone Encounter (Signed)
Patient called was told Dr.Jordan reviewed ECardio Monitor.Revealed frequent pac's and occasional pvc's.Advised to keep appointment with Dr.Jordan 08/14/12.

## 2012-08-14 ENCOUNTER — Ambulatory Visit (INDEPENDENT_AMBULATORY_CARE_PROVIDER_SITE_OTHER): Payer: Medicare Other | Admitting: Cardiology

## 2012-08-14 ENCOUNTER — Encounter: Payer: Self-pay | Admitting: Cardiology

## 2012-08-14 VITALS — BP 106/50 | HR 71 | Ht 66.0 in | Wt 126.4 lb

## 2012-08-14 DIAGNOSIS — I429 Cardiomyopathy, unspecified: Secondary | ICD-10-CM

## 2012-08-14 DIAGNOSIS — I491 Atrial premature depolarization: Secondary | ICD-10-CM | POA: Diagnosis not present

## 2012-08-14 DIAGNOSIS — I2510 Atherosclerotic heart disease of native coronary artery without angina pectoris: Secondary | ICD-10-CM

## 2012-08-14 DIAGNOSIS — I428 Other cardiomyopathies: Secondary | ICD-10-CM | POA: Diagnosis not present

## 2012-08-14 DIAGNOSIS — I4949 Other premature depolarization: Secondary | ICD-10-CM | POA: Diagnosis not present

## 2012-08-14 DIAGNOSIS — I493 Ventricular premature depolarization: Secondary | ICD-10-CM | POA: Insufficient documentation

## 2012-08-14 NOTE — Progress Notes (Signed)
[name] Date of Birth: 05-14-1928 Medical Record [id number]  History of Present Illness: Ms. Shawley is seen back today for a followup visit. She states she really hasn't been aware of palpitations but does note that when she checks her pulse it is irregular. She has had no dizziness or syncope. She denies any shortness of breath. Recent evaluation with echocardiogram showed stable ejection fraction of 45%. She did wear a 24-hour Holter monitor which demonstrated very frequent PACs with the longest run of 6 beats. She had occasional PVCs. There were no episodes of atrial fibrillation.  Current Outpatient Prescriptions on File Prior to Visit  Medication Sig Dispense Refill  . aspirin 81 MG tablet Take 81 mg by mouth daily.        . calcium-vitamin D (OSCAL WITH D) 500-200 MG-UNIT per tablet Take 1 tablet by mouth daily.      . carvedilol (COREG) 6.25 MG tablet Take 1 tablet (6.25 mg total) by mouth 2 (two) times daily with a meal.  60 tablet  11  . conjugated estrogens (PREMARIN) vaginal cream Place vaginally daily. 2 times/week       . fish oil-omega-3 fatty acids 1000 MG capsule Take 2 g by mouth daily.        . furosemide (LASIX) 40 MG tablet Take 1 tablet (40 mg total) by mouth daily.  30 tablet  5  . glucosamine-chondroitin 500-400 MG tablet Take 1 tablet by mouth daily.        . ramipril (ALTACE) 5 MG capsule Take 1 capsule (5 mg total) by mouth daily.  30 capsule  9  . simvastatin (ZOCOR) 20 MG tablet Take 1 tablet (20 mg total) by mouth at bedtime.  30 tablet  5  . vitamin C (ASCORBIC ACID) 500 MG tablet Take 250 mg by mouth daily.         Allergies  Allergen Reactions  . Penicillins     Past Medical History  Diagnosis Date  . CHF (congestive heart failure)     CHF with systolic dysfuntion ; dilated nonishemic cardiomyopathy  . Cancer     1989 colon  . Carotid bruit     left  . Hypercholesteremia   . CAD (coronary artery disease)   . Female bladder prolapse   . SOB  (shortness of breath)     Sometimes at night  . Epistaxis   . Hyponatremia     Probably secondary to her CHF  . Bladder tumor   . OSA (obstructive sleep apnea) 04/29/2012    Past Surgical History  Procedure Date  . Cardiac catheterization     non obstruction CAD  . Cholecystectomy   . Bladder tact   . Colectomy     sigmoid 1989 cancer  . Abdominal hysterectomy   . Bladder surgery     History  Smoking status  . Never Smoker   Smokeless tobacco  . Never Used    History  Alcohol Use No    Family History  Problem Relation Age of Onset  . Heart disease Father   . Heart attack Mother   . Heart attack Father     Review of Systems: The review of systems is per the HPI.  All other systems were reviewed and are negative.  Physical Exam: BP 106/50  Pulse 71  Ht 5\' 6"  (1.676 m)  Wt 126 lb 6.4 oz (57.335 kg)  BMI 20.40 kg/m2  SpO2 96% Patient is very pleasant and in no acute  distress. Skin is warm and dry. Color is normal.  HEENT is unremarkable. Normocephalic/atraumatic. PERRL. Sclera are nonicteric. Neck is supple. No masses. No JVD. Lungs are clear. Cardiac exam shows a regular rate and rhythm. She does have frequent ectopics. Abdomen is soft. Extremities are without edema. Gait and ROM are intact. No gross neurologic deficits noted.   LABORATORY DATA:   Transthoracic Echocardiography  Patient: Margeurite, [place] MR #: [id number] Study Date: 07/31/2012 Gender: F Age: 42 Height: 167.6cm Weight: 56.2kg BSA: 1.84m^2 Pt. Status: Room:  ORDERING Martinique, Peter ATTENDING Dola Argyle REFERRING [place] ORDERING Servando Snare, [place], [place] Reid, RDCS cc: Dr Burnard Bunting  ------------------------------------------------------------  ------------------------------------------------------------ Indications: Palpitations  785.1.  ------------------------------------------------------------ History: PMH: Carotid bruit. Bladder cancer surgery. Acquired from the patient and from the patient's chart. Palpitations. Coronary artery disease. Congestive heart failure. Risk factors: Dyslipidemia.  ------------------------------------------------------------ Study Conclusions  - Left ventricle: Hypokinesis at the base inferior and base inferolateral segments. The EF is 45%. Difficult to compare to the 2012 study. I think the LV may be a little better. The cavity size was normal. Wall thickness was normal. - Aortic valve: Sclerosis without stenosis. Mild regurgitation. - Ascending aorta: The ascending aorta was normal in size. - Mitral valve: Mild prolapse posterior leaflet. Mild MR. - Tricuspid valve: Moderate regurgitation. Transthoracic echocardiography. M-mode, complete 2D, spectral Doppler, and color Doppler. Height: Height: 167.6cm. Height: 66in. Weight: Weight: 56.2kg. Weight: 123.7lb. Body mass index: BMI: 20kg/m^2. Body surface area: BSA: 1.68m^2. Blood pressure: 146/58. Patient status: Outpatient. Location: [facility]  ------------------------------------------------------------  ------------------------------------------------------------ Left ventricle: Hypokinesis at the base inferior and base inferolateral segments. The EF is 45%. Difficult to compare to the 2012 study. I think the LV may be a little better. Tech The cavity size was normal. Wall thickness was normal.  ------------------------------------------------------------ Aortic valve: Sclerosis without stenosis. Doppler: Mild regurgitation.  ------------------------------------------------------------ Aorta: Aortic root: The aortic root was normal in size. Ascending aorta: The ascending aorta was normal in size.  ------------------------------------------------------------ Mitral valve: Mild prolapse posterior leaflet.  Mild MR. Doppler: Peak gradient: 42mm Hg (D).  ------------------------------------------------------------ Left atrium: The atrium was normal in size.  ------------------------------------------------------------ Right ventricle: The cavity size was normal. Systolic function was normal.  ------------------------------------------------------------ Pulmonic valve: The valve appears to be grossly normal. Doppler: No significant regurgitation.  ------------------------------------------------------------ Tricuspid valve: The valve appears to be grossly normal. Doppler: Moderate regurgitation.  ------------------------------------------------------------ Right atrium: The atrium was at the upper limits of normal in size.  ------------------------------------------------------------ Pericardium: There was no pericardial effusion.  ------------------------------------------------------------  2D measurements Normal Doppler measurements Normal Left ventricle Main pulmonary LVID ED, 51.2 mm 43-52 artery chord, Pressure, S 26 mm Hg =30 PLAX LVOT LVID ES, 36.8 mm 23-38 Peak vel, S 65. cm/s ------ chord, 1 PLAX VTI, S 15. cm ------ FS, chord, 28 % >29 4 PLAX Stroke vol 43. ml ------ LVPW, ED 10.3 mm ------ 7 IVS/LVPW 0.8 <1.3 Stroke 26. ml/m^2 ------ ratio, ED index 8 Ventricular septum Aortic valve IVS, ED 8.28 mm ------ Regurg PHT 529 ms ------ LVOT Mitral valve Diam, S 19 mm ------ Peak E vel 74. cm/s ------ Area 2.84 cm^2 ------ 6 Diam 19 mm ------ Peak A vel 105 cm/s ------ Aorta Deceleratio 204 ms 150-23 Root diam, 24 mm ------ n time 0 ED Peak 2 mm Hg ------ Left atrium gradient, D AP dim 34 mm ------ Peak E/A 0.7 ------ AP dim 2.09 cm/m^2 <2.2 ratio  index Tricuspid valve Regurg peak 230 cm/s ------ vel Peak RV-RA 21 mm Hg ------ gradient, S Systemic veins Estimated 5 mm Hg ------ CVP Right ventricle Pressure, S 26 mm Hg <30 Pulmonic valve Peak vel, S 64.  cm/s ------ 2  ------------------------------------------------------------ Prepared and Electronically Authenticated by  Dola Argyle 2013-08-15T17:49:[id number]   Assessment / Plan: 1. Frequent PACs. These are really not symptomatic. We will continue with her current dose of carvedilol. No sustained arrhythmias. No evidence of atrial fibrillation. I recommended avoidance of caffeine.  2. Occasional PVCs. Continue current beta blocker therapy.  3. Congestive heart failure with chronic systolic dysfunction. Ejection fraction is stable at 45%.  4. Coronary disease, asymptomatic.

## 2012-08-14 NOTE — Patient Instructions (Signed)
Continue your current medication  Get regular aerobic exercise.  I will see you again in 6 months.

## 2012-10-01 DIAGNOSIS — Z1231 Encounter for screening mammogram for malignant neoplasm of breast: Secondary | ICD-10-CM | POA: Diagnosis not present

## 2012-10-29 DIAGNOSIS — C679 Malignant neoplasm of bladder, unspecified: Secondary | ICD-10-CM | POA: Diagnosis not present

## 2012-11-10 DIAGNOSIS — D1801 Hemangioma of skin and subcutaneous tissue: Secondary | ICD-10-CM | POA: Diagnosis not present

## 2012-11-10 DIAGNOSIS — L821 Other seborrheic keratosis: Secondary | ICD-10-CM | POA: Diagnosis not present

## 2012-11-20 DIAGNOSIS — Z23 Encounter for immunization: Secondary | ICD-10-CM | POA: Diagnosis not present

## 2013-01-08 ENCOUNTER — Other Ambulatory Visit: Payer: Self-pay

## 2013-01-08 MED ORDER — FUROSEMIDE 40 MG PO TABS: 40.0000 mg | ORAL_TABLET | Freq: Every day | ORAL | Status: DC

## 2013-02-16 DIAGNOSIS — S335XXA Sprain of ligaments of lumbar spine, initial encounter: Secondary | ICD-10-CM | POA: Diagnosis not present

## 2013-02-20 ENCOUNTER — Ambulatory Visit: Payer: Medicare Other | Admitting: Nurse Practitioner

## 2013-02-23 DIAGNOSIS — S335XXA Sprain of ligaments of lumbar spine, initial encounter: Secondary | ICD-10-CM | POA: Diagnosis not present

## 2013-02-27 DIAGNOSIS — S335XXA Sprain of ligaments of lumbar spine, initial encounter: Secondary | ICD-10-CM | POA: Diagnosis not present

## 2013-03-02 ENCOUNTER — Ambulatory Visit (INDEPENDENT_AMBULATORY_CARE_PROVIDER_SITE_OTHER): Payer: Medicare Other | Admitting: Nurse Practitioner

## 2013-03-02 ENCOUNTER — Encounter: Payer: Self-pay | Admitting: Nurse Practitioner

## 2013-03-02 VITALS — BP 136/58 | HR 68 | Ht 66.0 in | Wt 131.0 lb

## 2013-03-02 DIAGNOSIS — E785 Hyperlipidemia, unspecified: Secondary | ICD-10-CM

## 2013-03-02 DIAGNOSIS — I491 Atrial premature depolarization: Secondary | ICD-10-CM

## 2013-03-02 NOTE — Patient Instructions (Addendum)
We will check some fasting labs in the next 2 weeks  Stay on your same medicines  Stay active  See Dr. Martinique in a year  Call the [facility] office at [phone] if you have any questions, problems or concerns.

## 2013-03-02 NOTE — Progress Notes (Signed)
Paige Cook Date of Birth: [birth year] Medical Record [id number]  History of Present Illness: Paige Cook is seen back today for her 6 month check. She is seen for Dr. Martinique. She has mild LV dysfunction with EF 45% and very frequent PACs on past Holter. No atrial fib has been identified. Other issues include nonobstructive CAD, HLD, bladder cancer, and OSA. Has 0 to 39% bilateral carotid disease with last duplex in June of 2013.   She comes in today. She is here with her daughter. She is doing well. No chest pain. Not short of breath. No swelling. Not lightheaded or dizzy. Feels ok on her medicines. Has not had her labs checked in some time. Has been going to [facility] for her cancer treatment and has not seen Dr. Reynaldo Minium. She denies palpitations.   Current Outpatient Prescriptions on File Prior to Visit  Medication Sig Dispense Refill  . aspirin 81 MG tablet Take 81 mg by mouth daily.        . calcium-vitamin D (OSCAL WITH D) 500-200 MG-UNIT per tablet Take 1 tablet by mouth daily.      . carvedilol (COREG) 6.25 MG tablet Take 1 tablet (6.25 mg total) by mouth 2 (two) times daily with a meal.  60 tablet  11  . conjugated estrogens (PREMARIN) vaginal cream Place vaginally daily. 2 times/week       . fish oil-omega-3 fatty acids 1000 MG capsule Take 2 g by mouth daily.        . furosemide (LASIX) 40 MG tablet Take 1 tablet (40 mg total) by mouth daily.  30 tablet  5  . glucosamine-chondroitin 500-400 MG tablet Take 1 tablet by mouth daily.        . ramipril (ALTACE) 5 MG capsule Take 1 capsule (5 mg total) by mouth daily.  30 capsule  9  . simvastatin (ZOCOR) 20 MG tablet Take 1 tablet (20 mg total) by mouth at bedtime.  30 tablet  5  . vitamin C (ASCORBIC ACID) 500 MG tablet Take 250 mg by mouth daily.        No current facility-administered medications on file prior to visit.    Allergies  Allergen Reactions  . Penicillins     Past Medical History  Diagnosis Date  . CHF (congestive  heart failure)     CHF with systolic dysfuntion ; dilated nonishemic cardiomyopathy  . Cancer     1989 colon  . Carotid bruit     left  . Hypercholesteremia   . CAD (coronary artery disease)   . Female bladder prolapse   . SOB (shortness of breath)     Sometimes at night  . Epistaxis   . Hyponatremia     Probably secondary to her CHF  . Bladder tumor   . OSA (obstructive sleep apnea) 04/29/2012    Past Surgical History  Procedure Laterality Date  . Cardiac catheterization      non obstruction CAD  . Cholecystectomy    . Bladder tact    . Colectomy      sigmoid 1989 cancer  . Abdominal hysterectomy    . Bladder surgery      History  Smoking status  . Never Smoker   Smokeless tobacco  . Never Used    History  Alcohol Use No    Family History  Problem Relation Age of Onset  . Heart disease Father   . Heart attack Mother   . Heart attack Father  Review of Systems: The review of systems is per the HPI.  All other systems were reviewed and are negative.  Physical Exam: BP 136/58  Pulse 68  Ht 5\' 6"  (1.676 m)  Wt 131 lb (59.421 kg)  BMI 21.15 kg/m2 Patient is very pleasant and in no acute distress. Skin is warm and dry. Color is normal.  HEENT is unremarkable. Normocephalic/atraumatic. PERRL. Sclera are nonicteric. Neck is supple. No masses. No JVD. Lungs are clear. Cardiac exam shows a regular rate and rhythm. She has very frequent ectopics. Abdomen is soft. Extremities are without edema. Gait and ROM are intact. No gross neurologic deficits noted.  LABORATORY DATA: Pending  EKG today shows sinus with PACs.    Lab Results  Component Value Date   WBC 7.3 05/18/2011   HGB 12.5 05/18/2011   HCT 36.1 05/18/2011   PLT 188.0 05/18/2011   GLUCOSE 114* 07/25/2012   CHOL 189 05/18/2011   TRIG 57.0 05/18/2011   HDL 85.60 05/18/2011   LDLCALC 92 05/18/2011   ALT 20 05/18/2011   AST 22 05/18/2011   NA 136 07/25/2012   K 5.1 07/25/2012   CL 97 07/25/2012   CREATININE 1.4* 07/25/2012     BUN 42* 07/25/2012   CO2 31 07/25/2012   TSH 0.82 07/25/2012   INR 0.97 01/21/2011     Assessment / Plan: 1. Asymptomatic PACs - continue with beta blocker.  2. Nonobstructive CAD - no symptoms reported.   3. Carotid disease - no symptoms reported.   4. Bladder cancer - followed at [facility]  5. HLD - needs fasting labs. Will arrange for her to come back.  Overall, she looks to be doing ok. Will check EKG today. Check fasting labs in the next 2 weeks. See Dr. Martinique back in one year.   Patient is agreeable to this plan and will call if any problems develop in the interim.   Burtis Junes, RN, ANP-C [place] [address] [place], [place]  [id number]

## 2013-03-06 ENCOUNTER — Other Ambulatory Visit (INDEPENDENT_AMBULATORY_CARE_PROVIDER_SITE_OTHER): Payer: Medicare Other

## 2013-03-06 DIAGNOSIS — I491 Atrial premature depolarization: Secondary | ICD-10-CM

## 2013-03-06 DIAGNOSIS — E785 Hyperlipidemia, unspecified: Secondary | ICD-10-CM

## 2013-03-06 DIAGNOSIS — S335XXA Sprain of ligaments of lumbar spine, initial encounter: Secondary | ICD-10-CM | POA: Diagnosis not present

## 2013-03-06 LAB — BASIC METABOLIC PANEL
BUN: 39 mg/dL — ABNORMAL HIGH (ref 6–23)
CO2: 29 mEq/L (ref 19–32)
Calcium: 9.1 mg/dL (ref 8.4–10.5)
Chloride: 105 mEq/L (ref 96–112)
Creatinine, Ser: 1.4 mg/dL — ABNORMAL HIGH (ref 0.4–1.2)
GFR: 39.49 mL/min — ABNORMAL LOW (ref >60.00)
Glucose, Bld: 98 mg/dL (ref 70–99)
Potassium: 5.3 mEq/L — ABNORMAL HIGH (ref 3.5–5.1)
Sodium: 141 mEq/L (ref 135–145)

## 2013-03-06 LAB — HEPATIC FUNCTION PANEL
ALT: 18 U/L (ref 0–35)
AST: 20 U/L (ref 0–37)
Albumin: 3.7 g/dL (ref 3.5–5.2)
Alkaline Phosphatase: 61 U/L (ref 39–117)
Bilirubin, Direct: 0.1 mg/dL (ref 0.0–0.3)
Total Bilirubin: 0.8 mg/dL (ref 0.3–1.2)
Total Protein: 6.8 g/dL (ref 6.0–8.3)

## 2013-03-06 LAB — LIPID PANEL
Cholesterol: 228 mg/dL — ABNORMAL HIGH (ref 0–200)
HDL: 65.4 mg/dL (ref >39.00)
Total CHOL/HDL Ratio: 3
Triglycerides: 69 mg/dL (ref 0.0–149.0)
VLDL: 13.8 mg/dL (ref 0.0–40.0)

## 2013-03-06 LAB — LDL CHOLESTEROL, DIRECT: Direct LDL: 152.9 mg/dL

## 2013-03-10 DIAGNOSIS — S335XXA Sprain of ligaments of lumbar spine, initial encounter: Secondary | ICD-10-CM | POA: Diagnosis not present

## 2013-03-11 DIAGNOSIS — M954 Acquired deformity of chest and rib: Secondary | ICD-10-CM | POA: Diagnosis not present

## 2013-03-11 DIAGNOSIS — E278 Other specified disorders of adrenal gland: Secondary | ICD-10-CM | POA: Diagnosis not present

## 2013-03-11 DIAGNOSIS — C679 Malignant neoplasm of bladder, unspecified: Secondary | ICD-10-CM | POA: Diagnosis not present

## 2013-03-11 DIAGNOSIS — R918 Other nonspecific abnormal finding of lung field: Secondary | ICD-10-CM | POA: Diagnosis not present

## 2013-03-11 DIAGNOSIS — M5137 Other intervertebral disc degeneration, lumbosacral region: Secondary | ICD-10-CM | POA: Diagnosis not present

## 2013-03-11 DIAGNOSIS — Z90710 Acquired absence of both cervix and uterus: Secondary | ICD-10-CM | POA: Diagnosis not present

## 2013-03-11 DIAGNOSIS — I700 Atherosclerosis of aorta: Secondary | ICD-10-CM | POA: Diagnosis not present

## 2013-03-11 DIAGNOSIS — K5730 Diverticulosis of large intestine without perforation or abscess without bleeding: Secondary | ICD-10-CM | POA: Diagnosis not present

## 2013-03-11 DIAGNOSIS — N289 Disorder of kidney and ureter, unspecified: Secondary | ICD-10-CM | POA: Diagnosis not present

## 2013-03-11 DIAGNOSIS — N179 Acute kidney failure, unspecified: Secondary | ICD-10-CM | POA: Diagnosis not present

## 2013-03-11 DIAGNOSIS — I517 Cardiomegaly: Secondary | ICD-10-CM | POA: Diagnosis not present

## 2013-03-12 DIAGNOSIS — S335XXA Sprain of ligaments of lumbar spine, initial encounter: Secondary | ICD-10-CM | POA: Diagnosis not present

## 2013-03-13 ENCOUNTER — Telehealth: Payer: Self-pay | Admitting: Cardiology

## 2013-03-13 NOTE — Telephone Encounter (Signed)
Spoke to patient she stated she is scheduled for bladder surgery at [facility] 04/09/13,needs cardiac clearance.Dr.Jordan not in office today will check with him next week and call back.

## 2013-03-13 NOTE — Telephone Encounter (Signed)
New Problem:    Patient called in needing a surgical clearance sent to Dr. Dimas Millin at [facility].  Please call back.

## 2013-03-18 DIAGNOSIS — C679 Malignant neoplasm of bladder, unspecified: Secondary | ICD-10-CM | POA: Diagnosis not present

## 2013-03-24 ENCOUNTER — Encounter: Payer: Self-pay | Admitting: Cardiology

## 2013-03-24 NOTE — Telephone Encounter (Signed)
Spoke to [place] 03/20/13 and he advised ok for patient to have bladder surgery by Dr.Inman at [facility].Note faxed to Dr.Inman at fax # [phone].

## 2013-04-06 DIAGNOSIS — I10 Essential (primary) hypertension: Secondary | ICD-10-CM | POA: Diagnosis not present

## 2013-04-06 DIAGNOSIS — E785 Hyperlipidemia, unspecified: Secondary | ICD-10-CM | POA: Diagnosis not present

## 2013-04-06 DIAGNOSIS — C679 Malignant neoplasm of bladder, unspecified: Secondary | ICD-10-CM | POA: Diagnosis not present

## 2013-04-06 DIAGNOSIS — I509 Heart failure, unspecified: Secondary | ICD-10-CM | POA: Diagnosis not present

## 2013-04-09 DIAGNOSIS — Z79899 Other long term (current) drug therapy: Secondary | ICD-10-CM | POA: Diagnosis not present

## 2013-04-09 DIAGNOSIS — I10 Essential (primary) hypertension: Secondary | ICD-10-CM | POA: Diagnosis not present

## 2013-04-09 DIAGNOSIS — D090 Carcinoma in situ of bladder: Secondary | ICD-10-CM | POA: Diagnosis not present

## 2013-04-09 DIAGNOSIS — I509 Heart failure, unspecified: Secondary | ICD-10-CM | POA: Diagnosis not present

## 2013-04-09 DIAGNOSIS — C679 Malignant neoplasm of bladder, unspecified: Secondary | ICD-10-CM | POA: Diagnosis not present

## 2013-04-09 DIAGNOSIS — E785 Hyperlipidemia, unspecified: Secondary | ICD-10-CM | POA: Diagnosis not present

## 2013-04-20 ENCOUNTER — Other Ambulatory Visit: Payer: Self-pay | Admitting: *Deleted

## 2013-04-20 MED ORDER — SIMVASTATIN 20 MG PO TABS: 20.0000 mg | ORAL_TABLET | Freq: Every day | ORAL | Status: DC

## 2013-04-22 DIAGNOSIS — C679 Malignant neoplasm of bladder, unspecified: Secondary | ICD-10-CM | POA: Diagnosis not present

## 2013-04-22 DIAGNOSIS — N390 Urinary tract infection, site not specified: Secondary | ICD-10-CM | POA: Diagnosis not present

## 2013-05-01 DIAGNOSIS — R82998 Other abnormal findings in urine: Secondary | ICD-10-CM | POA: Diagnosis not present

## 2013-05-01 DIAGNOSIS — R300 Dysuria: Secondary | ICD-10-CM | POA: Diagnosis not present

## 2013-05-13 DIAGNOSIS — C679 Malignant neoplasm of bladder, unspecified: Secondary | ICD-10-CM | POA: Diagnosis not present

## 2013-05-20 DIAGNOSIS — C679 Malignant neoplasm of bladder, unspecified: Secondary | ICD-10-CM | POA: Diagnosis not present

## 2013-05-27 DIAGNOSIS — C679 Malignant neoplasm of bladder, unspecified: Secondary | ICD-10-CM | POA: Diagnosis not present

## 2013-06-03 DIAGNOSIS — C679 Malignant neoplasm of bladder, unspecified: Secondary | ICD-10-CM | POA: Diagnosis not present

## 2013-06-10 DIAGNOSIS — C679 Malignant neoplasm of bladder, unspecified: Secondary | ICD-10-CM | POA: Diagnosis not present

## 2013-06-17 DIAGNOSIS — C679 Malignant neoplasm of bladder, unspecified: Secondary | ICD-10-CM | POA: Diagnosis not present

## 2013-06-18 ENCOUNTER — Other Ambulatory Visit: Payer: Self-pay | Admitting: *Deleted

## 2013-06-18 MED ORDER — RAMIPRIL 5 MG PO CAPS: 5.0000 mg | ORAL_CAPSULE | Freq: Every day | ORAL | Status: DC

## 2013-07-17 ENCOUNTER — Other Ambulatory Visit: Payer: Self-pay | Admitting: *Deleted

## 2013-07-17 MED ORDER — FUROSEMIDE 40 MG PO TABS: 40.0000 mg | ORAL_TABLET | Freq: Every day | ORAL | Status: DC

## 2013-07-22 DIAGNOSIS — C679 Malignant neoplasm of bladder, unspecified: Secondary | ICD-10-CM | POA: Diagnosis not present

## 2013-07-22 DIAGNOSIS — D3500 Benign neoplasm of unspecified adrenal gland: Secondary | ICD-10-CM | POA: Diagnosis not present

## 2013-07-28 DIAGNOSIS — M999 Biomechanical lesion, unspecified: Secondary | ICD-10-CM | POA: Diagnosis not present

## 2013-07-28 DIAGNOSIS — M461 Sacroiliitis, not elsewhere classified: Secondary | ICD-10-CM | POA: Diagnosis not present

## 2013-07-28 DIAGNOSIS — M5137 Other intervertebral disc degeneration, lumbosacral region: Secondary | ICD-10-CM | POA: Diagnosis not present

## 2013-08-20 ENCOUNTER — Other Ambulatory Visit: Payer: Self-pay | Admitting: *Deleted

## 2013-08-20 DIAGNOSIS — M999 Biomechanical lesion, unspecified: Secondary | ICD-10-CM | POA: Diagnosis not present

## 2013-08-20 DIAGNOSIS — M461 Sacroiliitis, not elsewhere classified: Secondary | ICD-10-CM | POA: Diagnosis not present

## 2013-08-20 DIAGNOSIS — M5137 Other intervertebral disc degeneration, lumbosacral region: Secondary | ICD-10-CM | POA: Diagnosis not present

## 2013-08-20 MED ORDER — CARVEDILOL 6.25 MG PO TABS: 6.2500 mg | ORAL_TABLET | Freq: Two times a day (BID) | ORAL | Status: DC

## 2013-08-24 DIAGNOSIS — M461 Sacroiliitis, not elsewhere classified: Secondary | ICD-10-CM | POA: Diagnosis not present

## 2013-08-24 DIAGNOSIS — M5137 Other intervertebral disc degeneration, lumbosacral region: Secondary | ICD-10-CM | POA: Diagnosis not present

## 2013-08-24 DIAGNOSIS — M999 Biomechanical lesion, unspecified: Secondary | ICD-10-CM | POA: Diagnosis not present

## 2013-08-25 DIAGNOSIS — M5137 Other intervertebral disc degeneration, lumbosacral region: Secondary | ICD-10-CM | POA: Diagnosis not present

## 2013-08-25 DIAGNOSIS — M999 Biomechanical lesion, unspecified: Secondary | ICD-10-CM | POA: Diagnosis not present

## 2013-08-25 DIAGNOSIS — M461 Sacroiliitis, not elsewhere classified: Secondary | ICD-10-CM | POA: Diagnosis not present

## 2013-08-27 DIAGNOSIS — M461 Sacroiliitis, not elsewhere classified: Secondary | ICD-10-CM | POA: Diagnosis not present

## 2013-08-27 DIAGNOSIS — M999 Biomechanical lesion, unspecified: Secondary | ICD-10-CM | POA: Diagnosis not present

## 2013-08-27 DIAGNOSIS — M5137 Other intervertebral disc degeneration, lumbosacral region: Secondary | ICD-10-CM | POA: Diagnosis not present

## 2013-08-31 DIAGNOSIS — M5137 Other intervertebral disc degeneration, lumbosacral region: Secondary | ICD-10-CM | POA: Diagnosis not present

## 2013-08-31 DIAGNOSIS — M461 Sacroiliitis, not elsewhere classified: Secondary | ICD-10-CM | POA: Diagnosis not present

## 2013-08-31 DIAGNOSIS — M999 Biomechanical lesion, unspecified: Secondary | ICD-10-CM | POA: Diagnosis not present

## 2013-09-01 DIAGNOSIS — M5137 Other intervertebral disc degeneration, lumbosacral region: Secondary | ICD-10-CM | POA: Diagnosis not present

## 2013-09-01 DIAGNOSIS — M461 Sacroiliitis, not elsewhere classified: Secondary | ICD-10-CM | POA: Diagnosis not present

## 2013-09-01 DIAGNOSIS — M999 Biomechanical lesion, unspecified: Secondary | ICD-10-CM | POA: Diagnosis not present

## 2013-09-03 DIAGNOSIS — M999 Biomechanical lesion, unspecified: Secondary | ICD-10-CM | POA: Diagnosis not present

## 2013-09-03 DIAGNOSIS — M461 Sacroiliitis, not elsewhere classified: Secondary | ICD-10-CM | POA: Diagnosis not present

## 2013-09-03 DIAGNOSIS — M5137 Other intervertebral disc degeneration, lumbosacral region: Secondary | ICD-10-CM | POA: Diagnosis not present

## 2013-09-07 DIAGNOSIS — M5137 Other intervertebral disc degeneration, lumbosacral region: Secondary | ICD-10-CM | POA: Diagnosis not present

## 2013-09-07 DIAGNOSIS — M999 Biomechanical lesion, unspecified: Secondary | ICD-10-CM | POA: Diagnosis not present

## 2013-09-07 DIAGNOSIS — M461 Sacroiliitis, not elsewhere classified: Secondary | ICD-10-CM | POA: Diagnosis not present

## 2013-09-08 DIAGNOSIS — M461 Sacroiliitis, not elsewhere classified: Secondary | ICD-10-CM | POA: Diagnosis not present

## 2013-09-08 DIAGNOSIS — M999 Biomechanical lesion, unspecified: Secondary | ICD-10-CM | POA: Diagnosis not present

## 2013-09-08 DIAGNOSIS — M5137 Other intervertebral disc degeneration, lumbosacral region: Secondary | ICD-10-CM | POA: Diagnosis not present

## 2013-09-10 DIAGNOSIS — M999 Biomechanical lesion, unspecified: Secondary | ICD-10-CM | POA: Diagnosis not present

## 2013-09-10 DIAGNOSIS — M5137 Other intervertebral disc degeneration, lumbosacral region: Secondary | ICD-10-CM | POA: Diagnosis not present

## 2013-09-10 DIAGNOSIS — M461 Sacroiliitis, not elsewhere classified: Secondary | ICD-10-CM | POA: Diagnosis not present

## 2013-09-14 DIAGNOSIS — M999 Biomechanical lesion, unspecified: Secondary | ICD-10-CM | POA: Diagnosis not present

## 2013-09-14 DIAGNOSIS — M5137 Other intervertebral disc degeneration, lumbosacral region: Secondary | ICD-10-CM | POA: Diagnosis not present

## 2013-09-14 DIAGNOSIS — M461 Sacroiliitis, not elsewhere classified: Secondary | ICD-10-CM | POA: Diagnosis not present

## 2013-09-15 DIAGNOSIS — M461 Sacroiliitis, not elsewhere classified: Secondary | ICD-10-CM | POA: Diagnosis not present

## 2013-09-15 DIAGNOSIS — M5137 Other intervertebral disc degeneration, lumbosacral region: Secondary | ICD-10-CM | POA: Diagnosis not present

## 2013-09-15 DIAGNOSIS — M999 Biomechanical lesion, unspecified: Secondary | ICD-10-CM | POA: Diagnosis not present

## 2013-09-17 DIAGNOSIS — M461 Sacroiliitis, not elsewhere classified: Secondary | ICD-10-CM | POA: Diagnosis not present

## 2013-09-17 DIAGNOSIS — M5137 Other intervertebral disc degeneration, lumbosacral region: Secondary | ICD-10-CM | POA: Diagnosis not present

## 2013-09-17 DIAGNOSIS — M999 Biomechanical lesion, unspecified: Secondary | ICD-10-CM | POA: Diagnosis not present

## 2013-09-22 DIAGNOSIS — M999 Biomechanical lesion, unspecified: Secondary | ICD-10-CM | POA: Diagnosis not present

## 2013-09-22 DIAGNOSIS — M461 Sacroiliitis, not elsewhere classified: Secondary | ICD-10-CM | POA: Diagnosis not present

## 2013-09-22 DIAGNOSIS — M5137 Other intervertebral disc degeneration, lumbosacral region: Secondary | ICD-10-CM | POA: Diagnosis not present

## 2013-09-23 ENCOUNTER — Ambulatory Visit (INDEPENDENT_AMBULATORY_CARE_PROVIDER_SITE_OTHER): Payer: Medicare Other | Admitting: Cardiology

## 2013-09-23 ENCOUNTER — Encounter: Payer: Self-pay | Admitting: Cardiology

## 2013-09-23 VITALS — BP 126/68 | HR 40 | Ht 66.0 in | Wt 124.0 lb

## 2013-09-23 DIAGNOSIS — R0602 Shortness of breath: Secondary | ICD-10-CM

## 2013-09-23 DIAGNOSIS — R0989 Other specified symptoms and signs involving the circulatory and respiratory systems: Secondary | ICD-10-CM | POA: Diagnosis not present

## 2013-09-23 DIAGNOSIS — I2510 Atherosclerotic heart disease of native coronary artery without angina pectoris: Secondary | ICD-10-CM

## 2013-09-23 DIAGNOSIS — I491 Atrial premature depolarization: Secondary | ICD-10-CM

## 2013-09-23 DIAGNOSIS — E7800 Pure hypercholesterolemia, unspecified: Secondary | ICD-10-CM

## 2013-09-23 LAB — CBC WITH DIFFERENTIAL/PLATELET
Basophils Absolute: 0 10*3/uL (ref 0.0–0.1)
Basophils Relative: 0.7 % (ref 0.0–3.0)
HCT: 36.2 % (ref 36.0–46.0)
Hemoglobin: 12.8 g/dL (ref 12.0–15.0)
Lymphocytes Relative: 31.3 % (ref 12.0–46.0)
Lymphs Abs: 1.8 10*3/uL (ref 0.7–4.0)
MCV: 94.1 fl (ref 78.0–100.0)
Monocytes Relative: 11.9 % (ref 3.0–12.0)
Neutro Abs: 3.1 10*3/uL (ref 1.4–7.7)
RBC: 3.85 Mil/uL — ABNORMAL LOW (ref 3.87–5.11)
RDW: 12.9 % (ref 11.5–14.6)

## 2013-09-23 LAB — BASIC METABOLIC PANEL
CO2: 26 mEq/L (ref 19–32)
Glucose, Bld: 119 mg/dL — ABNORMAL HIGH (ref 70–99)
Potassium: 4.7 mEq/L (ref 3.5–5.1)
Sodium: 139 mEq/L (ref 135–145)

## 2013-09-23 NOTE — Progress Notes (Signed)
Paige Cook Date of Birth: [birth year] Medical Record [id number]  History of Present Illness: Paige Cook is seen back today for a followup visit. She has mild LV dysfunction with EF 45% and very frequent PACs on past Holter. No atrial fib has been identified. Other issues include nonobstructive CAD, HLD, bladder cancer, and OSA. Has 0 to 39% bilateral carotid disease with last duplex in June of 2013.  She does report some symptoms of shortness of breath at night. She is unsure whether this is related to her heart or from her sleep apnea. She is using CPAP therapy. She did have surgery for bladder cancer and 2013 and then underwent BCG treatment in 2014. She reports that she is currently cancer free.  Current Outpatient Prescriptions on File Prior to Visit  Medication Sig Dispense Refill  . acetaminophen (TYLENOL) 325 MG tablet Take 650 mg by mouth as needed for pain.      Marland Kitchen aspirin 81 MG tablet Take 81 mg by mouth daily.        . calcium-vitamin D (OSCAL WITH D) 500-200 MG-UNIT per tablet Take 1 tablet by mouth daily.      . carvedilol (COREG) 6.25 MG tablet Take 1 tablet (6.25 mg total) by mouth 2 (two) times daily with a meal.  60 tablet  5  . conjugated estrogens (PREMARIN) vaginal cream Place vaginally daily. 2 times/week       . fish oil-omega-3 fatty acids 1000 MG capsule Take 2 g by mouth daily.        . furosemide (LASIX) 40 MG tablet Take 1 tablet (40 mg total) by mouth daily.  30 tablet  5  . glucosamine-chondroitin 500-400 MG tablet Take 1 tablet by mouth daily.        . ramipril (ALTACE) 5 MG capsule Take 1 capsule (5 mg total) by mouth daily.  30 capsule  3  . simvastatin (ZOCOR) 20 MG tablet Take 1 tablet (20 mg total) by mouth at bedtime.  30 tablet  5  . vitamin C (ASCORBIC ACID) 500 MG tablet Take 250 mg by mouth daily.        No current facility-administered medications on file prior to visit.    Allergies  Allergen Reactions  . Penicillins     Past Medical History   Diagnosis Date  . CHF (congestive heart failure)     CHF with systolic dysfuntion ; dilated nonishemic cardiomyopathy  . Cancer     1989 colon  . Carotid bruit     left  . Hypercholesteremia   . CAD (coronary artery disease)   . Female bladder prolapse   . SOB (shortness of breath)     Sometimes at night  . Epistaxis   . Hyponatremia     Probably secondary to her CHF  . Bladder tumor   . OSA (obstructive sleep apnea) 04/29/2012    Past Surgical History  Procedure Laterality Date  . Cardiac catheterization      non obstruction CAD  . Cholecystectomy    . Bladder tact    . Colectomy      sigmoid 1989 cancer  . Abdominal hysterectomy    . Bladder surgery      History  Smoking status  . Never Smoker   Smokeless tobacco  . Never Used    History  Alcohol Use No    Family History  Problem Relation Age of Onset  . Heart disease Father   . Heart attack Mother   .  Heart attack Father     Review of Systems: The review of systems is per the HPI.  All other systems were reviewed and are negative.  Physical Exam: BP 126/68  Pulse 40  Ht 5\' 6"  (1.676 m)  Wt 124 lb (56.246 kg)  BMI 20.02 kg/m2  SpO2 98% Patient is very pleasant and in no acute distress. Skin is warm and dry. Color is normal.  HEENT is unremarkable. Normocephalic/atraumatic. PERRL. Sclera are nonicteric. Neck is supple. No masses. No JVD. She has a loud left carotid bruit. Lungs are clear. Cardiac exam shows a regular rate and rhythm with frequent extrasystoles. There are no murmurs or gallops. Abdomen is soft. Extremities are without edema. Gait and ROM are intact. No gross neurologic deficits noted.  LABORATORY DATA:   Lab Results  Component Value Date   WBC 5.9 09/23/2013   HGB 12.8 09/23/2013   HCT 36.2 09/23/2013   PLT 202.0 09/23/2013   GLUCOSE 119* 09/23/2013   CHOL 228* 03/06/2013   TRIG 69.0 03/06/2013   HDL 65.40 03/06/2013   LDLDIRECT 152.9 03/06/2013   LDLCALC 92 05/18/2011   ALT 18  03/06/2013   AST 20 03/06/2013   NA 139 09/23/2013   K 4.7 09/23/2013   CL 101 09/23/2013   CREATININE 1.5* 09/23/2013   BUN 55* 09/23/2013   CO2 26 09/23/2013   TSH 0.82 07/25/2012   INR 0.97 01/21/2011    BMP: 128. Assessment / Plan: 1. Asymptomatic PACs - continue with beta blocker.  2. Nonobstructive CAD - no symptoms reported.   3. Carotid disease - no symptoms reported. Carotid Doppler studies last year showed mild disease. Recommend followup every other year.  4. Bladder cancer - followed at [facility]  5. HLD  6. Nocturnal dyspnea. Her cardiac exam is normal. BNP level is normal. We will continue her current cardiac therapy and followup again in 6 months.

## 2013-09-23 NOTE — Patient Instructions (Signed)
We will check some lab work today.  Continue your current therapy  I will see you in 6 months.

## 2013-09-24 ENCOUNTER — Telehealth: Payer: Self-pay | Admitting: Cardiology

## 2013-09-24 DIAGNOSIS — M461 Sacroiliitis, not elsewhere classified: Secondary | ICD-10-CM | POA: Diagnosis not present

## 2013-09-24 DIAGNOSIS — M999 Biomechanical lesion, unspecified: Secondary | ICD-10-CM | POA: Diagnosis not present

## 2013-09-24 DIAGNOSIS — M5137 Other intervertebral disc degeneration, lumbosacral region: Secondary | ICD-10-CM | POA: Diagnosis not present

## 2013-09-24 NOTE — Telephone Encounter (Signed)
Follow up  Pt retuning call abut lab work.   Please give pt a call back.

## 2013-09-25 NOTE — Telephone Encounter (Signed)
Returned call to patient 09/24/13 lab results given.Copy of lab mailed to patient.

## 2013-09-29 DIAGNOSIS — M461 Sacroiliitis, not elsewhere classified: Secondary | ICD-10-CM | POA: Diagnosis not present

## 2013-09-29 DIAGNOSIS — M5137 Other intervertebral disc degeneration, lumbosacral region: Secondary | ICD-10-CM | POA: Diagnosis not present

## 2013-09-29 DIAGNOSIS — M999 Biomechanical lesion, unspecified: Secondary | ICD-10-CM | POA: Diagnosis not present

## 2013-10-01 DIAGNOSIS — M461 Sacroiliitis, not elsewhere classified: Secondary | ICD-10-CM | POA: Diagnosis not present

## 2013-10-01 DIAGNOSIS — M5137 Other intervertebral disc degeneration, lumbosacral region: Secondary | ICD-10-CM | POA: Diagnosis not present

## 2013-10-01 DIAGNOSIS — M999 Biomechanical lesion, unspecified: Secondary | ICD-10-CM | POA: Diagnosis not present

## 2013-10-06 DIAGNOSIS — M999 Biomechanical lesion, unspecified: Secondary | ICD-10-CM | POA: Diagnosis not present

## 2013-10-06 DIAGNOSIS — M5137 Other intervertebral disc degeneration, lumbosacral region: Secondary | ICD-10-CM | POA: Diagnosis not present

## 2013-10-06 DIAGNOSIS — M461 Sacroiliitis, not elsewhere classified: Secondary | ICD-10-CM | POA: Diagnosis not present

## 2013-10-08 DIAGNOSIS — M5137 Other intervertebral disc degeneration, lumbosacral region: Secondary | ICD-10-CM | POA: Diagnosis not present

## 2013-10-08 DIAGNOSIS — M461 Sacroiliitis, not elsewhere classified: Secondary | ICD-10-CM | POA: Diagnosis not present

## 2013-10-08 DIAGNOSIS — M999 Biomechanical lesion, unspecified: Secondary | ICD-10-CM | POA: Diagnosis not present

## 2013-10-13 DIAGNOSIS — M461 Sacroiliitis, not elsewhere classified: Secondary | ICD-10-CM | POA: Diagnosis not present

## 2013-10-13 DIAGNOSIS — M999 Biomechanical lesion, unspecified: Secondary | ICD-10-CM | POA: Diagnosis not present

## 2013-10-13 DIAGNOSIS — M5137 Other intervertebral disc degeneration, lumbosacral region: Secondary | ICD-10-CM | POA: Diagnosis not present

## 2013-10-14 ENCOUNTER — Ambulatory Visit (HOSPITAL_COMMUNITY): Payer: Medicare Other | Attending: Cardiology

## 2013-10-14 DIAGNOSIS — E785 Hyperlipidemia, unspecified: Secondary | ICD-10-CM | POA: Diagnosis not present

## 2013-10-14 DIAGNOSIS — R0989 Other specified symptoms and signs involving the circulatory and respiratory systems: Secondary | ICD-10-CM | POA: Insufficient documentation

## 2013-10-14 DIAGNOSIS — I658 Occlusion and stenosis of other precerebral arteries: Secondary | ICD-10-CM | POA: Diagnosis not present

## 2013-10-14 DIAGNOSIS — I6529 Occlusion and stenosis of unspecified carotid artery: Secondary | ICD-10-CM | POA: Insufficient documentation

## 2013-10-14 DIAGNOSIS — G458 Other transient cerebral ischemic attacks and related syndromes: Secondary | ICD-10-CM

## 2013-10-14 DIAGNOSIS — I2510 Atherosclerotic heart disease of native coronary artery without angina pectoris: Secondary | ICD-10-CM | POA: Diagnosis not present

## 2013-10-15 ENCOUNTER — Other Ambulatory Visit: Payer: Self-pay | Admitting: Cardiology

## 2013-10-20 DIAGNOSIS — M461 Sacroiliitis, not elsewhere classified: Secondary | ICD-10-CM | POA: Diagnosis not present

## 2013-10-20 DIAGNOSIS — M999 Biomechanical lesion, unspecified: Secondary | ICD-10-CM | POA: Diagnosis not present

## 2013-10-20 DIAGNOSIS — M5137 Other intervertebral disc degeneration, lumbosacral region: Secondary | ICD-10-CM | POA: Diagnosis not present

## 2013-10-21 DIAGNOSIS — D3500 Benign neoplasm of unspecified adrenal gland: Secondary | ICD-10-CM | POA: Diagnosis not present

## 2013-10-21 DIAGNOSIS — Z09 Encounter for follow-up examination after completed treatment for conditions other than malignant neoplasm: Secondary | ICD-10-CM | POA: Diagnosis not present

## 2013-10-21 DIAGNOSIS — I517 Cardiomegaly: Secondary | ICD-10-CM | POA: Diagnosis not present

## 2013-10-21 DIAGNOSIS — C679 Malignant neoplasm of bladder, unspecified: Secondary | ICD-10-CM | POA: Diagnosis not present

## 2013-10-21 DIAGNOSIS — Z8551 Personal history of malignant neoplasm of bladder: Secondary | ICD-10-CM | POA: Diagnosis not present

## 2013-10-27 DIAGNOSIS — M999 Biomechanical lesion, unspecified: Secondary | ICD-10-CM | POA: Diagnosis not present

## 2013-10-27 DIAGNOSIS — M5137 Other intervertebral disc degeneration, lumbosacral region: Secondary | ICD-10-CM | POA: Diagnosis not present

## 2013-10-27 DIAGNOSIS — M461 Sacroiliitis, not elsewhere classified: Secondary | ICD-10-CM | POA: Diagnosis not present

## 2013-10-29 DIAGNOSIS — Z23 Encounter for immunization: Secondary | ICD-10-CM | POA: Diagnosis not present

## 2013-11-02 DIAGNOSIS — C679 Malignant neoplasm of bladder, unspecified: Secondary | ICD-10-CM | POA: Diagnosis not present

## 2013-11-05 DIAGNOSIS — M999 Biomechanical lesion, unspecified: Secondary | ICD-10-CM | POA: Diagnosis not present

## 2013-11-05 DIAGNOSIS — M5137 Other intervertebral disc degeneration, lumbosacral region: Secondary | ICD-10-CM | POA: Diagnosis not present

## 2013-11-05 DIAGNOSIS — M461 Sacroiliitis, not elsewhere classified: Secondary | ICD-10-CM | POA: Diagnosis not present

## 2013-11-09 DIAGNOSIS — C679 Malignant neoplasm of bladder, unspecified: Secondary | ICD-10-CM | POA: Diagnosis not present

## 2013-11-16 DIAGNOSIS — C679 Malignant neoplasm of bladder, unspecified: Secondary | ICD-10-CM | POA: Diagnosis not present

## 2013-11-19 DIAGNOSIS — M999 Biomechanical lesion, unspecified: Secondary | ICD-10-CM | POA: Diagnosis not present

## 2013-11-19 DIAGNOSIS — M5137 Other intervertebral disc degeneration, lumbosacral region: Secondary | ICD-10-CM | POA: Diagnosis not present

## 2013-11-19 DIAGNOSIS — M461 Sacroiliitis, not elsewhere classified: Secondary | ICD-10-CM | POA: Diagnosis not present

## 2013-12-01 DIAGNOSIS — M999 Biomechanical lesion, unspecified: Secondary | ICD-10-CM | POA: Diagnosis not present

## 2013-12-01 DIAGNOSIS — M5137 Other intervertebral disc degeneration, lumbosacral region: Secondary | ICD-10-CM | POA: Diagnosis not present

## 2013-12-01 DIAGNOSIS — M461 Sacroiliitis, not elsewhere classified: Secondary | ICD-10-CM | POA: Diagnosis not present

## 2013-12-15 DIAGNOSIS — M545 Low back pain: Secondary | ICD-10-CM | POA: Diagnosis not present

## 2013-12-15 DIAGNOSIS — M999 Biomechanical lesion, unspecified: Secondary | ICD-10-CM | POA: Diagnosis not present

## 2013-12-15 DIAGNOSIS — M5137 Other intervertebral disc degeneration, lumbosacral region: Secondary | ICD-10-CM | POA: Diagnosis not present

## 2013-12-29 DIAGNOSIS — M999 Biomechanical lesion, unspecified: Secondary | ICD-10-CM | POA: Diagnosis not present

## 2013-12-29 DIAGNOSIS — M545 Low back pain, unspecified: Secondary | ICD-10-CM | POA: Diagnosis not present

## 2013-12-29 DIAGNOSIS — M5137 Other intervertebral disc degeneration, lumbosacral region: Secondary | ICD-10-CM | POA: Diagnosis not present

## 2014-01-19 DIAGNOSIS — M5137 Other intervertebral disc degeneration, lumbosacral region: Secondary | ICD-10-CM | POA: Diagnosis not present

## 2014-01-19 DIAGNOSIS — M999 Biomechanical lesion, unspecified: Secondary | ICD-10-CM | POA: Diagnosis not present

## 2014-01-19 DIAGNOSIS — M545 Low back pain, unspecified: Secondary | ICD-10-CM | POA: Diagnosis not present

## 2014-01-20 ENCOUNTER — Other Ambulatory Visit: Payer: Self-pay | Admitting: Cardiology

## 2014-01-22 DIAGNOSIS — C679 Malignant neoplasm of bladder, unspecified: Secondary | ICD-10-CM | POA: Diagnosis not present

## 2014-01-22 DIAGNOSIS — D3500 Benign neoplasm of unspecified adrenal gland: Secondary | ICD-10-CM | POA: Diagnosis not present

## 2014-02-16 DIAGNOSIS — M545 Low back pain, unspecified: Secondary | ICD-10-CM | POA: Diagnosis not present

## 2014-02-16 DIAGNOSIS — M999 Biomechanical lesion, unspecified: Secondary | ICD-10-CM | POA: Diagnosis not present

## 2014-02-16 DIAGNOSIS — M5137 Other intervertebral disc degeneration, lumbosacral region: Secondary | ICD-10-CM | POA: Diagnosis not present

## 2014-02-25 ENCOUNTER — Other Ambulatory Visit: Payer: Self-pay | Admitting: Cardiology

## 2014-03-02 DIAGNOSIS — M545 Low back pain, unspecified: Secondary | ICD-10-CM | POA: Diagnosis not present

## 2014-03-02 DIAGNOSIS — M5137 Other intervertebral disc degeneration, lumbosacral region: Secondary | ICD-10-CM | POA: Diagnosis not present

## 2014-03-02 DIAGNOSIS — M999 Biomechanical lesion, unspecified: Secondary | ICD-10-CM | POA: Diagnosis not present

## 2014-03-03 ENCOUNTER — Other Ambulatory Visit: Payer: Self-pay | Admitting: *Deleted

## 2014-03-03 MED ORDER — CARVEDILOL 6.25 MG PO TABS: 6.2500 mg | ORAL_TABLET | Freq: Two times a day (BID) | ORAL | Status: DC

## 2014-03-16 DIAGNOSIS — M999 Biomechanical lesion, unspecified: Secondary | ICD-10-CM | POA: Diagnosis not present

## 2014-03-16 DIAGNOSIS — M545 Low back pain, unspecified: Secondary | ICD-10-CM | POA: Diagnosis not present

## 2014-03-16 DIAGNOSIS — M5137 Other intervertebral disc degeneration, lumbosacral region: Secondary | ICD-10-CM | POA: Diagnosis not present

## 2014-03-25 ENCOUNTER — Other Ambulatory Visit: Payer: Self-pay | Admitting: Cardiology

## 2014-04-15 ENCOUNTER — Encounter: Payer: Self-pay | Admitting: Cardiology

## 2014-04-15 ENCOUNTER — Ambulatory Visit (INDEPENDENT_AMBULATORY_CARE_PROVIDER_SITE_OTHER): Payer: Medicare Other | Admitting: Cardiology

## 2014-04-15 VITALS — BP 104/50 | HR 65 | Ht 66.0 in | Wt 123.0 lb

## 2014-04-15 DIAGNOSIS — I2510 Atherosclerotic heart disease of native coronary artery without angina pectoris: Secondary | ICD-10-CM

## 2014-04-15 DIAGNOSIS — I491 Atrial premature depolarization: Secondary | ICD-10-CM

## 2014-04-15 DIAGNOSIS — E7800 Pure hypercholesterolemia, unspecified: Secondary | ICD-10-CM | POA: Diagnosis not present

## 2014-04-15 DIAGNOSIS — R0989 Other specified symptoms and signs involving the circulatory and respiratory systems: Secondary | ICD-10-CM

## 2014-04-15 DIAGNOSIS — I509 Heart failure, unspecified: Secondary | ICD-10-CM

## 2014-04-15 DIAGNOSIS — I4949 Other premature depolarization: Secondary | ICD-10-CM

## 2014-04-15 DIAGNOSIS — I5022 Chronic systolic (congestive) heart failure: Secondary | ICD-10-CM

## 2014-04-15 DIAGNOSIS — I493 Ventricular premature depolarization: Secondary | ICD-10-CM

## 2014-04-15 NOTE — Progress Notes (Signed)
Paige Cook Date of Birth: 1928-05-31 Medical Record [id number]  History of Present Illness: Paige Cook is seen back today for a followup visit. She has mild LV dysfunction with EF 45% and very frequent PACs on past Holter. No atrial fib has been identified. Other issues include nonobstructive CAD, HLD, bladder cancer, and OSA. Has 0 to 39% bilateral carotid disease with last duplex in June of 2013.  She has a history of sleep apnea and using CPAP therapy. She did have surgery for bladder cancer and 2013 and then underwent BCG treatment in 2014. She reports that she is currently cancer free. She reports that she feels the best that she has felt in 2 years. No chest pain or dyspnea. Sometimes notes difficulty having BP taken due to ectopy but otherwise has no palpitations.  Current Outpatient Prescriptions on File Prior to Visit  Medication Sig Dispense Refill  . acetaminophen (TYLENOL) 325 MG tablet Take 650 mg by mouth as needed for pain.      Marland Kitchen aspirin 81 MG tablet Take 81 mg by mouth daily.        . calcium-vitamin D (OSCAL WITH D) 500-200 MG-UNIT per tablet Take 1 tablet by mouth daily.      . carvedilol (COREG) 6.25 MG tablet Take 1 tablet (6.25 mg total) by mouth 2 (two) times daily with a meal.  60 tablet  1  . fish oil-omega-3 fatty acids 1000 MG capsule Take 2 g by mouth daily.        . furosemide (LASIX) 40 MG tablet TAKE 1 TABLET EVERY DAY  30 tablet  3  . glucosamine-chondroitin 500-400 MG tablet Take 1 tablet by mouth daily.        . ramipril (ALTACE) 5 MG capsule TAKE 1 CAPSULE (5 MG TOTAL) BY MOUTH DAILY.  30 capsule  1  . simvastatin (ZOCOR) 20 MG tablet Take 1 tablet (20 mg total) by mouth at bedtime.  30 tablet  5  . vitamin C (ASCORBIC ACID) 500 MG tablet Take 250 mg by mouth daily.        No current facility-administered medications on file prior to visit.    Allergies  Allergen Reactions  . Penicillins     Past Medical History  Diagnosis Date  . CHF (congestive  heart failure)     CHF with systolic dysfuntion ; dilated nonishemic cardiomyopathy  . Cancer     1989 colon  . Carotid bruit     left  . Hypercholesteremia   . CAD (coronary artery disease)   . Female bladder prolapse   . SOB (shortness of breath)     Sometimes at night  . Epistaxis   . Hyponatremia     Probably secondary to her CHF  . Bladder tumor   . OSA (obstructive sleep apnea) 04/29/2012    Past Surgical History  Procedure Laterality Date  . Cardiac catheterization      non obstruction CAD  . Cholecystectomy    . Bladder tact    . Colectomy      sigmoid 1989 cancer  . Abdominal hysterectomy    . Bladder surgery      History  Smoking status  . Never Smoker   Smokeless tobacco  . Never Used    History  Alcohol Use No    Family History  Problem Relation Age of Onset  . Heart disease Father   . Heart attack Mother   . Heart attack Father  Review of Systems: The review of systems is per the HPI.  All other systems were reviewed and are negative.  Physical Exam: BP 104/50  Pulse 65  Ht 5\' 6"  (1.676 m)  Wt 123 lb (55.792 kg)  BMI 19.86 kg/m2 Patient is very pleasant and in no acute distress. Skin is warm and dry. Color is normal.  HEENT is unremarkable. Normocephalic/atraumatic. PERRL. Sclera are nonicteric. Neck is supple. No masses. No JVD. She has a loud left carotid bruit. Lungs are clear. Cardiac exam shows a regular rate and rhythm with frequent extrasystoles. There are no murmurs or gallops. Abdomen is soft. Extremities are without edema. Gait and ROM are intact. No gross neurologic deficits noted.  LABORATORY DATA:   Lab Results  Component Value Date   WBC 5.9 09/23/2013   HGB 12.8 09/23/2013   HCT 36.2 09/23/2013   PLT 202.0 09/23/2013   GLUCOSE 119* 09/23/2013   CHOL 228* 03/06/2013   TRIG 69.0 03/06/2013   HDL 65.40 03/06/2013   LDLDIRECT 152.9 03/06/2013   LDLCALC 92 05/18/2011   ALT 18 03/06/2013   AST 20 03/06/2013   NA 139 09/23/2013   K  4.7 09/23/2013   CL 101 09/23/2013   CREATININE 1.5* 09/23/2013   BUN 55* 09/23/2013   CO2 26 09/23/2013   TSH 0.82 07/25/2012   INR 0.97 01/21/2011   Ecg: NSR with occ PAC and PVC. Otherwise normal.   Assessment / Plan: 1. Asymptomatic PACs/PVCs - continue with beta blocker.  2. Nonobstructive CAD - no symptoms reported.   3. Carotid disease - no symptoms reported. Carotid Doppler studies last year showed mild disease. Recommend followup every other year.  4. Bladder cancer - followed at [facility]  5. HLD  6. Sleep apnea. Now on CPAP

## 2014-04-15 NOTE — Patient Instructions (Signed)
Continue your current therapy  I will see you in 6 months.   

## 2014-04-28 DIAGNOSIS — C679 Malignant neoplasm of bladder, unspecified: Secondary | ICD-10-CM | POA: Diagnosis not present

## 2014-04-28 DIAGNOSIS — I517 Cardiomegaly: Secondary | ICD-10-CM | POA: Diagnosis not present

## 2014-04-28 DIAGNOSIS — M949 Disorder of cartilage, unspecified: Secondary | ICD-10-CM | POA: Diagnosis not present

## 2014-04-28 DIAGNOSIS — I700 Atherosclerosis of aorta: Secondary | ICD-10-CM | POA: Diagnosis not present

## 2014-04-28 DIAGNOSIS — M899 Disorder of bone, unspecified: Secondary | ICD-10-CM | POA: Diagnosis not present

## 2014-05-05 ENCOUNTER — Other Ambulatory Visit: Payer: Self-pay | Admitting: Cardiology

## 2014-05-13 DIAGNOSIS — C679 Malignant neoplasm of bladder, unspecified: Secondary | ICD-10-CM | POA: Diagnosis not present

## 2014-05-18 DIAGNOSIS — C679 Malignant neoplasm of bladder, unspecified: Secondary | ICD-10-CM | POA: Diagnosis not present

## 2014-05-26 DIAGNOSIS — C679 Malignant neoplasm of bladder, unspecified: Secondary | ICD-10-CM | POA: Diagnosis not present

## 2014-08-11 ENCOUNTER — Other Ambulatory Visit: Payer: Self-pay | Admitting: Cardiology

## 2014-08-25 DIAGNOSIS — C679 Malignant neoplasm of bladder, unspecified: Secondary | ICD-10-CM | POA: Diagnosis not present

## 2014-10-15 ENCOUNTER — Ambulatory Visit: Payer: Medicare Other | Admitting: Cardiology

## 2014-10-15 ENCOUNTER — Other Ambulatory Visit: Payer: Self-pay | Admitting: *Deleted

## 2014-10-15 MED ORDER — FUROSEMIDE 40 MG PO TABS: ORAL_TABLET | ORAL | Status: DC

## 2014-10-18 ENCOUNTER — Ambulatory Visit (INDEPENDENT_AMBULATORY_CARE_PROVIDER_SITE_OTHER): Payer: Medicare Other | Admitting: Physician Assistant

## 2014-10-18 ENCOUNTER — Encounter: Payer: Self-pay | Admitting: Physician Assistant

## 2014-10-18 VITALS — BP 131/60 | HR 62 | Ht 66.0 in | Wt 121.0 lb

## 2014-10-18 DIAGNOSIS — I6529 Occlusion and stenosis of unspecified carotid artery: Secondary | ICD-10-CM

## 2014-10-18 DIAGNOSIS — I2510 Atherosclerotic heart disease of native coronary artery without angina pectoris: Secondary | ICD-10-CM

## 2014-10-18 DIAGNOSIS — I5022 Chronic systolic (congestive) heart failure: Secondary | ICD-10-CM

## 2014-10-18 DIAGNOSIS — E7800 Pure hypercholesterolemia, unspecified: Secondary | ICD-10-CM

## 2014-10-18 NOTE — Patient Instructions (Addendum)
Your physician recommends that you return for a FASTING LIPID, LIVER and BMP--nothing to eat or drink after midnight, lab opens at 7:30 AM (pt will call back to schedule appt)  Your physician recommends that you continue on your current medications as directed. Please refer to the Current Medication list given to you today.  Your physician wants you to follow-up in: 6 MONTHS with Dr Martinique.  You will receive a reminder letter in the mail two months in advance. If you don't receive a letter, please call our office to schedule the follow-up appointment.

## 2014-10-18 NOTE — Progress Notes (Signed)
Cardiology Office Note   Date:  10/18/2014   ID:  Paige Cook, DOB [birth year], MRN [id number]  PCP:  Geoffery Lyons, MD  Cardiologist:  Dr. Peter Martinique     History of Present Illness: Paige Cook is a 80 y.o. female with a hx of cardiomyopathy, systolic HF, CAD, bladder CA, OSA, HL, PACs.  Notes indicate she has a hx of non-obstructive CAD.  Unfortunately, the cath report cannot be accessed form 2011 at this time [facility] upgrade 10/17/14).  Last carotid US was 09/2013.  This report is also not accessible.  The notes indicate she had mild bilateral carotid disease and L subclavian steal.    She returns for follow-up. She is doing well. She denies chest discomfort or significant dyspnea. She denies orthopnea or LE edema. She denies increased abdominal girth. She sleeps with CPAP. She denies syncope. She will go to [facility] later this year for her final BCG injection for bladder CA.   Studies:  - LHC (12/2009):  Cannot access report in Epic today 10/18/2014   - Echo (8/13):  Inf and Inf-Lat HK, EF is 45%.  Aortic valve: Sclerosis without stenosis. Mild regurgitation.  Mitral valve: Mild prolapse posterior leaflet. Mild MR.  Tricuspid valve: Moderate regurgitation.  - Nuclear (12/2009):  Anterior ischemia, EF 25%  - Carotid US (10/14):  Results not available today 10/18/14   Recent Labs/Images:  No results for input(s): NA, K, BUN, CREATININE, ALT, HGB, TSH, LDLCALC, LDLDIRECT, HDL, BNP, PROBNP in the last 8760 hours.  Invalid input(s): LDL    Wt Readings from Last 3 Encounters:  10/18/14 121 lb (54.885 kg)  04/15/14 123 lb (55.792 kg)  09/23/13 124 lb (56.246 kg)     Past Medical History  Diagnosis Date  . CHF (congestive heart failure)     CHF with systolic dysfuntion ; dilated nonishemic cardiomyopathy  . Cancer     1989 colon  . Carotid bruit     left  . Hypercholesteremia   . CAD (coronary artery disease)   . Female bladder prolapse   . SOB (shortness of breath)    Sometimes at night  . Epistaxis   . Hyponatremia     Probably secondary to her CHF  . Bladder tumor   . OSA (obstructive sleep apnea) 04/29/2012    Current Outpatient Prescriptions  Medication Sig Dispense Refill  . acetaminophen (TYLENOL) 325 MG tablet Take 650 mg by mouth as needed for pain.    Marland Kitchen aspirin 81 MG tablet Take 81 mg by mouth daily.      . calcium-vitamin D (OSCAL WITH D) 500-200 MG-UNIT per tablet Take 1 tablet by mouth daily.    . carvedilol (COREG) 6.25 MG tablet TAKE 1 TABLET BY MOUTH TWICE A DAY WITH MEALS 60 tablet 5  . fish oil-omega-3 fatty acids 1000 MG capsule Take 1,000 mg by mouth daily.     . furosemide (LASIX) 40 MG tablet TAKE 1 TABLET EVERY DAY 30 tablet 1  . glucosamine-chondroitin 500-400 MG tablet Take 1 tablet by mouth daily.      . ramipril (ALTACE) 5 MG capsule TAKE 1 CAPSULE (5 MG TOTAL) BY MOUTH DAILY. 30 capsule 5  . simvastatin (ZOCOR) 20 MG tablet Take 1 tablet (20 mg total) by mouth at bedtime. 30 tablet 5  . vitamin C (ASCORBIC ACID) 500 MG tablet Take 500 mg by mouth daily.      No current facility-administered medications for this visit.     Allergies:  Penicillins   Social History:  The patient  reports that she has never smoked. She has never used smokeless tobacco. She reports that she does not drink alcohol or use illicit drugs.   Family History:  The patient's family history includes Heart attack in her father and mother; Heart disease in her father. There is no history of Stroke.   ROS:  Please see the history of present illness.       All other systems reviewed and negative.    PHYSICAL EXAM: VS:  BP 131/60 mmHg  Pulse 62  Ht 5\' 6"  (1.676 m)  Wt 121 lb (54.885 kg)  BMI 19.54 kg/m2 Well nourished, well developed, in no acute distress HEENT: normal Neck:  no JVD Cardiac:  normal S1, S2;   RRR; no murmur Lungs:   clear to auscultation bilaterally, no wheezing, rhonchi or rales Abd: soft, nontender, no hepatomegaly Ext:  no  edema Skin: warm and dry Neuro:  CNs 2-12 intact, no focal abnormalities noted  EKG:  NSR, HR 62, normal axis, PVCs, no change from prior tracing      ASSESSMENT AND PLAN:  1.  Coronary artery disease:  Nonobstructive by cardiac catheterization in the past. She denies any symptoms consistent with angina. Continue aspirin, beta blocker, statin. 2.  Chronic systolic CHF (congestive heart failure):  Volume stable. Continue current regimen. Arrange follow-up BMET. 3.  Hypercholesteremia - Continue statin.  Plan: Lipid panel, Hepatic function panel. 4.  Carotid stenosis, unspecified laterality:  Follow-up carotid US 09/2015.  Disposition:   FU with Dr. Martinique 6 months.   Signed, Versie Starks, MHS 10/18/2014 12:07 PM    [address], [place], [place]  [address] Phone: [phone]; Fax: [phone]

## 2014-10-20 DIAGNOSIS — Z23 Encounter for immunization: Secondary | ICD-10-CM | POA: Diagnosis not present

## 2014-11-15 ENCOUNTER — Other Ambulatory Visit: Payer: Self-pay | Admitting: *Deleted

## 2014-11-15 MED ORDER — RAMIPRIL 5 MG PO CAPS: ORAL_CAPSULE | ORAL | Status: DC

## 2014-11-15 MED ORDER — CARVEDILOL 6.25 MG PO TABS: 6.2500 mg | ORAL_TABLET | Freq: Two times a day (BID) | ORAL | Status: DC

## 2014-11-24 DIAGNOSIS — C679 Malignant neoplasm of bladder, unspecified: Secondary | ICD-10-CM | POA: Diagnosis not present

## 2014-12-06 ENCOUNTER — Other Ambulatory Visit: Payer: Self-pay

## 2014-12-06 MED ORDER — FUROSEMIDE 40 MG PO TABS: ORAL_TABLET | ORAL | Status: DC

## 2014-12-06 NOTE — Telephone Encounter (Signed)
Rx sent to pharmacy   

## 2014-12-23 DIAGNOSIS — C678 Malignant neoplasm of overlapping sites of bladder: Secondary | ICD-10-CM | POA: Diagnosis not present

## 2014-12-23 DIAGNOSIS — Z5112 Encounter for antineoplastic immunotherapy: Secondary | ICD-10-CM | POA: Diagnosis not present

## 2014-12-30 DIAGNOSIS — C679 Malignant neoplasm of bladder, unspecified: Secondary | ICD-10-CM | POA: Diagnosis not present

## 2015-01-05 DIAGNOSIS — M4127 Other idiopathic scoliosis, lumbosacral region: Secondary | ICD-10-CM | POA: Diagnosis not present

## 2015-01-05 DIAGNOSIS — M5136 Other intervertebral disc degeneration, lumbar region: Secondary | ICD-10-CM | POA: Diagnosis not present

## 2015-01-05 DIAGNOSIS — M9903 Segmental and somatic dysfunction of lumbar region: Secondary | ICD-10-CM | POA: Diagnosis not present

## 2015-01-06 DIAGNOSIS — I491 Atrial premature depolarization: Secondary | ICD-10-CM | POA: Diagnosis not present

## 2015-01-06 DIAGNOSIS — I509 Heart failure, unspecified: Secondary | ICD-10-CM | POA: Diagnosis not present

## 2015-01-06 DIAGNOSIS — I10 Essential (primary) hypertension: Secondary | ICD-10-CM | POA: Diagnosis not present

## 2015-01-06 DIAGNOSIS — M5136 Other intervertebral disc degeneration, lumbar region: Secondary | ICD-10-CM | POA: Diagnosis not present

## 2015-01-06 DIAGNOSIS — Z681 Body mass index (BMI) 19 or less, adult: Secondary | ICD-10-CM | POA: Diagnosis not present

## 2015-01-06 DIAGNOSIS — I2510 Atherosclerotic heart disease of native coronary artery without angina pectoris: Secondary | ICD-10-CM | POA: Diagnosis not present

## 2015-01-06 DIAGNOSIS — E785 Hyperlipidemia, unspecified: Secondary | ICD-10-CM | POA: Diagnosis not present

## 2015-01-12 DIAGNOSIS — M545 Low back pain: Secondary | ICD-10-CM | POA: Diagnosis not present

## 2015-01-12 DIAGNOSIS — M41125 Adolescent idiopathic scoliosis, thoracolumbar region: Secondary | ICD-10-CM | POA: Diagnosis not present

## 2015-01-12 DIAGNOSIS — M5136 Other intervertebral disc degeneration, lumbar region: Secondary | ICD-10-CM | POA: Diagnosis not present

## 2015-01-14 DIAGNOSIS — M5136 Other intervertebral disc degeneration, lumbar region: Secondary | ICD-10-CM | POA: Diagnosis not present

## 2015-01-20 DIAGNOSIS — M5136 Other intervertebral disc degeneration, lumbar region: Secondary | ICD-10-CM | POA: Diagnosis not present

## 2015-01-20 DIAGNOSIS — M4807 Spinal stenosis, lumbosacral region: Secondary | ICD-10-CM | POA: Diagnosis not present

## 2015-02-01 DIAGNOSIS — M9903 Segmental and somatic dysfunction of lumbar region: Secondary | ICD-10-CM | POA: Diagnosis not present

## 2015-02-01 DIAGNOSIS — M4127 Other idiopathic scoliosis, lumbosacral region: Secondary | ICD-10-CM | POA: Diagnosis not present

## 2015-02-01 DIAGNOSIS — M5136 Other intervertebral disc degeneration, lumbar region: Secondary | ICD-10-CM | POA: Diagnosis not present

## 2015-02-03 DIAGNOSIS — M4127 Other idiopathic scoliosis, lumbosacral region: Secondary | ICD-10-CM | POA: Diagnosis not present

## 2015-02-03 DIAGNOSIS — M9903 Segmental and somatic dysfunction of lumbar region: Secondary | ICD-10-CM | POA: Diagnosis not present

## 2015-02-03 DIAGNOSIS — M5136 Other intervertebral disc degeneration, lumbar region: Secondary | ICD-10-CM | POA: Diagnosis not present

## 2015-02-07 ENCOUNTER — Other Ambulatory Visit: Payer: Self-pay

## 2015-02-07 MED ORDER — FUROSEMIDE 40 MG PO TABS: 40.0000 mg | ORAL_TABLET | Freq: Every day | ORAL | Status: DC

## 2015-02-07 NOTE — Telephone Encounter (Signed)
Rx(s) sent to pharmacy electronically.  

## 2015-02-08 DIAGNOSIS — M4127 Other idiopathic scoliosis, lumbosacral region: Secondary | ICD-10-CM | POA: Diagnosis not present

## 2015-02-08 DIAGNOSIS — M9903 Segmental and somatic dysfunction of lumbar region: Secondary | ICD-10-CM | POA: Diagnosis not present

## 2015-02-08 DIAGNOSIS — M5136 Other intervertebral disc degeneration, lumbar region: Secondary | ICD-10-CM | POA: Diagnosis not present

## 2015-02-09 ENCOUNTER — Telehealth: Payer: Self-pay

## 2015-02-09 NOTE — Telephone Encounter (Signed)
Called and verified with [facility] that they did receive refill authorization for [facility]ix since [facility] sent in another [facility] today. [facility] and pt has picked Furosemide up already.

## 2015-02-10 DIAGNOSIS — M9903 Segmental and somatic dysfunction of lumbar region: Secondary | ICD-10-CM | POA: Diagnosis not present

## 2015-02-10 DIAGNOSIS — M4127 Other idiopathic scoliosis, lumbosacral region: Secondary | ICD-10-CM | POA: Diagnosis not present

## 2015-02-10 DIAGNOSIS — M5136 Other intervertebral disc degeneration, lumbar region: Secondary | ICD-10-CM | POA: Diagnosis not present

## 2015-02-11 DIAGNOSIS — M65342 Trigger finger, left ring finger: Secondary | ICD-10-CM | POA: Diagnosis not present

## 2015-02-15 DIAGNOSIS — M4127 Other idiopathic scoliosis, lumbosacral region: Secondary | ICD-10-CM | POA: Diagnosis not present

## 2015-02-15 DIAGNOSIS — M9903 Segmental and somatic dysfunction of lumbar region: Secondary | ICD-10-CM | POA: Diagnosis not present

## 2015-02-15 DIAGNOSIS — M5136 Other intervertebral disc degeneration, lumbar region: Secondary | ICD-10-CM | POA: Diagnosis not present

## 2015-02-17 DIAGNOSIS — M9903 Segmental and somatic dysfunction of lumbar region: Secondary | ICD-10-CM | POA: Diagnosis not present

## 2015-02-17 DIAGNOSIS — M4127 Other idiopathic scoliosis, lumbosacral region: Secondary | ICD-10-CM | POA: Diagnosis not present

## 2015-02-17 DIAGNOSIS — M5136 Other intervertebral disc degeneration, lumbar region: Secondary | ICD-10-CM | POA: Diagnosis not present

## 2015-02-18 DIAGNOSIS — M65342 Trigger finger, left ring finger: Secondary | ICD-10-CM | POA: Diagnosis not present

## 2015-02-22 DIAGNOSIS — M4127 Other idiopathic scoliosis, lumbosacral region: Secondary | ICD-10-CM | POA: Diagnosis not present

## 2015-02-22 DIAGNOSIS — M9903 Segmental and somatic dysfunction of lumbar region: Secondary | ICD-10-CM | POA: Diagnosis not present

## 2015-02-22 DIAGNOSIS — M5136 Other intervertebral disc degeneration, lumbar region: Secondary | ICD-10-CM | POA: Diagnosis not present

## 2015-02-24 DIAGNOSIS — M4127 Other idiopathic scoliosis, lumbosacral region: Secondary | ICD-10-CM | POA: Diagnosis not present

## 2015-02-24 DIAGNOSIS — M5136 Other intervertebral disc degeneration, lumbar region: Secondary | ICD-10-CM | POA: Diagnosis not present

## 2015-02-24 DIAGNOSIS — M9903 Segmental and somatic dysfunction of lumbar region: Secondary | ICD-10-CM | POA: Diagnosis not present

## 2015-03-01 DIAGNOSIS — M4127 Other idiopathic scoliosis, lumbosacral region: Secondary | ICD-10-CM | POA: Diagnosis not present

## 2015-03-01 DIAGNOSIS — M5136 Other intervertebral disc degeneration, lumbar region: Secondary | ICD-10-CM | POA: Diagnosis not present

## 2015-03-01 DIAGNOSIS — M9903 Segmental and somatic dysfunction of lumbar region: Secondary | ICD-10-CM | POA: Diagnosis not present

## 2015-03-03 DIAGNOSIS — M5136 Other intervertebral disc degeneration, lumbar region: Secondary | ICD-10-CM | POA: Diagnosis not present

## 2015-03-03 DIAGNOSIS — M4807 Spinal stenosis, lumbosacral region: Secondary | ICD-10-CM | POA: Diagnosis not present

## 2015-03-04 DIAGNOSIS — M9903 Segmental and somatic dysfunction of lumbar region: Secondary | ICD-10-CM | POA: Diagnosis not present

## 2015-03-04 DIAGNOSIS — M4127 Other idiopathic scoliosis, lumbosacral region: Secondary | ICD-10-CM | POA: Diagnosis not present

## 2015-03-04 DIAGNOSIS — M5136 Other intervertebral disc degeneration, lumbar region: Secondary | ICD-10-CM | POA: Diagnosis not present

## 2015-03-09 DIAGNOSIS — I10 Essential (primary) hypertension: Secondary | ICD-10-CM | POA: Diagnosis not present

## 2015-03-09 DIAGNOSIS — I509 Heart failure, unspecified: Secondary | ICD-10-CM | POA: Diagnosis not present

## 2015-03-09 DIAGNOSIS — C679 Malignant neoplasm of bladder, unspecified: Secondary | ICD-10-CM | POA: Diagnosis not present

## 2015-03-09 DIAGNOSIS — Z79899 Other long term (current) drug therapy: Secondary | ICD-10-CM | POA: Diagnosis not present

## 2015-03-10 DIAGNOSIS — M5136 Other intervertebral disc degeneration, lumbar region: Secondary | ICD-10-CM | POA: Diagnosis not present

## 2015-03-10 DIAGNOSIS — M9903 Segmental and somatic dysfunction of lumbar region: Secondary | ICD-10-CM | POA: Diagnosis not present

## 2015-03-10 DIAGNOSIS — M4127 Other idiopathic scoliosis, lumbosacral region: Secondary | ICD-10-CM | POA: Diagnosis not present

## 2015-03-15 DIAGNOSIS — M4127 Other idiopathic scoliosis, lumbosacral region: Secondary | ICD-10-CM | POA: Diagnosis not present

## 2015-03-15 DIAGNOSIS — M5136 Other intervertebral disc degeneration, lumbar region: Secondary | ICD-10-CM | POA: Diagnosis not present

## 2015-03-15 DIAGNOSIS — M9903 Segmental and somatic dysfunction of lumbar region: Secondary | ICD-10-CM | POA: Diagnosis not present

## 2015-03-17 DIAGNOSIS — M4127 Other idiopathic scoliosis, lumbosacral region: Secondary | ICD-10-CM | POA: Diagnosis not present

## 2015-03-17 DIAGNOSIS — M5136 Other intervertebral disc degeneration, lumbar region: Secondary | ICD-10-CM | POA: Diagnosis not present

## 2015-03-17 DIAGNOSIS — M9903 Segmental and somatic dysfunction of lumbar region: Secondary | ICD-10-CM | POA: Diagnosis not present

## 2015-03-22 DIAGNOSIS — M4127 Other idiopathic scoliosis, lumbosacral region: Secondary | ICD-10-CM | POA: Diagnosis not present

## 2015-03-22 DIAGNOSIS — M9903 Segmental and somatic dysfunction of lumbar region: Secondary | ICD-10-CM | POA: Diagnosis not present

## 2015-03-22 DIAGNOSIS — M5136 Other intervertebral disc degeneration, lumbar region: Secondary | ICD-10-CM | POA: Diagnosis not present

## 2015-03-24 DIAGNOSIS — M4127 Other idiopathic scoliosis, lumbosacral region: Secondary | ICD-10-CM | POA: Diagnosis not present

## 2015-03-24 DIAGNOSIS — M9903 Segmental and somatic dysfunction of lumbar region: Secondary | ICD-10-CM | POA: Diagnosis not present

## 2015-03-24 DIAGNOSIS — M5136 Other intervertebral disc degeneration, lumbar region: Secondary | ICD-10-CM | POA: Diagnosis not present

## 2015-03-29 DIAGNOSIS — M9903 Segmental and somatic dysfunction of lumbar region: Secondary | ICD-10-CM | POA: Diagnosis not present

## 2015-03-29 DIAGNOSIS — M5136 Other intervertebral disc degeneration, lumbar region: Secondary | ICD-10-CM | POA: Diagnosis not present

## 2015-03-29 DIAGNOSIS — M4127 Other idiopathic scoliosis, lumbosacral region: Secondary | ICD-10-CM | POA: Diagnosis not present

## 2015-03-31 DIAGNOSIS — M4127 Other idiopathic scoliosis, lumbosacral region: Secondary | ICD-10-CM | POA: Diagnosis not present

## 2015-03-31 DIAGNOSIS — M5136 Other intervertebral disc degeneration, lumbar region: Secondary | ICD-10-CM | POA: Diagnosis not present

## 2015-03-31 DIAGNOSIS — M9903 Segmental and somatic dysfunction of lumbar region: Secondary | ICD-10-CM | POA: Diagnosis not present

## 2015-04-19 DIAGNOSIS — M9903 Segmental and somatic dysfunction of lumbar region: Secondary | ICD-10-CM | POA: Diagnosis not present

## 2015-04-19 DIAGNOSIS — M5136 Other intervertebral disc degeneration, lumbar region: Secondary | ICD-10-CM | POA: Diagnosis not present

## 2015-04-19 DIAGNOSIS — M4127 Other idiopathic scoliosis, lumbosacral region: Secondary | ICD-10-CM | POA: Diagnosis not present

## 2015-04-26 DIAGNOSIS — M4127 Other idiopathic scoliosis, lumbosacral region: Secondary | ICD-10-CM | POA: Diagnosis not present

## 2015-04-26 DIAGNOSIS — M9903 Segmental and somatic dysfunction of lumbar region: Secondary | ICD-10-CM | POA: Diagnosis not present

## 2015-04-26 DIAGNOSIS — M5136 Other intervertebral disc degeneration, lumbar region: Secondary | ICD-10-CM | POA: Diagnosis not present

## 2015-04-28 ENCOUNTER — Ambulatory Visit (INDEPENDENT_AMBULATORY_CARE_PROVIDER_SITE_OTHER): Payer: Medicare Other | Admitting: Cardiology

## 2015-04-28 ENCOUNTER — Encounter: Payer: Self-pay | Admitting: Cardiology

## 2015-04-28 VITALS — BP 110/64 | HR 72 | Ht 66.0 in | Wt 119.0 lb

## 2015-04-28 DIAGNOSIS — I5022 Chronic systolic (congestive) heart failure: Secondary | ICD-10-CM

## 2015-04-28 DIAGNOSIS — I509 Heart failure, unspecified: Secondary | ICD-10-CM | POA: Diagnosis not present

## 2015-04-28 DIAGNOSIS — I491 Atrial premature depolarization: Secondary | ICD-10-CM | POA: Diagnosis not present

## 2015-04-28 DIAGNOSIS — E7800 Pure hypercholesterolemia, unspecified: Secondary | ICD-10-CM

## 2015-04-28 DIAGNOSIS — I493 Ventricular premature depolarization: Secondary | ICD-10-CM

## 2015-04-28 LAB — CBC WITH DIFFERENTIAL/PLATELET
Basophils Absolute: 0.1 10*3/uL (ref 0.0–0.1)
Basophils Relative: 1 % (ref 0–1)
Eosinophils Absolute: 0.3 10*3/uL (ref 0.0–0.7)
Eosinophils Relative: 6 % — ABNORMAL HIGH (ref 0–5)
HCT: 32.8 % — ABNORMAL LOW (ref 36.0–46.0)
Hemoglobin: 10.9 g/dL — ABNORMAL LOW (ref 12.0–15.0)
LYMPHS PCT: 27 % (ref 12–46)
Lymphs Abs: 1.4 10*3/uL (ref 0.7–4.0)
MCH: 31.8 pg (ref 26.0–34.0)
MCHC: 33.2 g/dL (ref 30.0–36.0)
MCV: 95.6 fL (ref 78.0–100.0)
MONO ABS: 0.7 10*3/uL (ref 0.1–1.0)
MPV: 10.5 fL (ref 8.6–12.4)
Monocytes Relative: 14 % — ABNORMAL HIGH (ref 3–12)
NEUTROS ABS: 2.7 10*3/uL (ref 1.7–7.7)
NEUTROS PCT: 52 % (ref 43–77)
PLATELETS: 209 10*3/uL (ref 150–400)
RBC: 3.43 MIL/uL — AB (ref 3.87–5.11)
RDW: 13.4 % (ref 11.5–15.5)
WBC: 5.1 10*3/uL (ref 4.0–10.5)

## 2015-04-28 LAB — BASIC METABOLIC PANEL
BUN: 40 mg/dL — AB (ref 6–23)
CO2: 26 mEq/L (ref 19–32)
CREATININE: 1.5 mg/dL — AB (ref 0.50–1.10)
Calcium: 9 mg/dL (ref 8.4–10.5)
Chloride: 104 mEq/L (ref 96–112)
Glucose, Bld: 103 mg/dL — ABNORMAL HIGH (ref 70–99)
Potassium: 4.5 mEq/L (ref 3.5–5.3)
Sodium: 140 mEq/L (ref 135–145)

## 2015-04-28 LAB — HEPATIC FUNCTION PANEL
ALT: 13 U/L (ref 0–35)
AST: 15 U/L (ref 0–37)
Albumin: 3.8 g/dL (ref 3.5–5.2)
Alkaline Phosphatase: 65 U/L (ref 39–117)
Bilirubin, Direct: 0.1 mg/dL (ref 0.0–0.3)
Indirect Bilirubin: 0.3 mg/dL (ref 0.2–1.2)
Total Bilirubin: 0.4 mg/dL (ref 0.2–1.2)
Total Protein: 6.2 g/dL (ref 6.0–8.3)

## 2015-04-28 LAB — LIPID PANEL
CHOL/HDL RATIO: 3.1 ratio
CHOLESTEROL: 245 mg/dL — AB (ref 0–200)
HDL: 79 mg/dL (ref >=46)
LDL Cholesterol: 150 mg/dL — ABNORMAL HIGH (ref 0–99)
Triglycerides: 82 mg/dL (ref <150)
VLDL: 16 mg/dL (ref 0–40)

## 2015-04-28 NOTE — Progress Notes (Signed)
Paige Cook Date of Birth: 1928-09-14 Medical Record [id number]  History of Present Illness: Paige Cook is seen for follow up Cardiac disease. She has mild LV dysfunction with EF 45% and very frequent PACs on past Holter. No atrial fib has been identified. Other issues include nonobstructive CAD, HLD, bladder cancer, and OSA. Has 0 to 39% bilateral carotid disease with last duplex in October 2014.  She has a history of sleep apnea and using CPAP therapy. She did have surgery for bladder cancer and 2013 and then underwent BCG treatment in 2014. She reports that she is currently cancer free. She reports that she is feeling very well. Exercises for one hour a day. Stays very active.  No chest pain or dyspnea. No palpitations.  Current Outpatient Prescriptions on File Prior to Visit  Medication Sig Dispense Refill  . acetaminophen (TYLENOL) 325 MG tablet Take 650 mg by mouth as needed for pain.    Marland Kitchen aspirin 81 MG tablet Take 81 mg by mouth daily.      . calcium-vitamin D (OSCAL WITH D) 500-200 MG-UNIT per tablet Take 1 tablet by mouth daily.    . carvedilol (COREG) 6.25 MG tablet Take 1 tablet (6.25 mg total) by mouth 2 (two) times daily with a meal. 60 tablet 5  . fish oil-omega-3 fatty acids 1000 MG capsule Take 1,000 mg by mouth daily.     . furosemide (LASIX) 40 MG tablet Take 1 tablet (40 mg total) by mouth daily. 30 tablet 9  . glucosamine-chondroitin 500-400 MG tablet Take 1 tablet by mouth daily.      . ramipril (ALTACE) 5 MG capsule TAKE 1 CAPSULE (5 MG TOTAL) BY MOUTH DAILY. 30 capsule 5  . vitamin C (ASCORBIC ACID) 500 MG tablet Take 500 mg by mouth daily.      No current facility-administered medications on file prior to visit.    Allergies  Allergen Reactions  . Penicillins   . Tramadol     naseau and shakes    Past Medical History  Diagnosis Date  . CHF (congestive heart failure)     CHF with systolic dysfuntion ; dilated nonishemic cardiomyopathy  . Cancer     1989  colon  . Carotid bruit     left  . Hypercholesteremia   . CAD (coronary artery disease)   . Female bladder prolapse   . SOB (shortness of breath)     Sometimes at night  . Epistaxis   . Hyponatremia     Probably secondary to her CHF  . Bladder tumor   . OSA (obstructive sleep apnea) 04/29/2012    Past Surgical History  Procedure Laterality Date  . Cardiac catheterization      non obstruction CAD  . Cholecystectomy    . Bladder tact    . Colectomy      sigmoid 1989 cancer  . Abdominal hysterectomy    . Bladder surgery      History  Smoking status  . Never Smoker   Smokeless tobacco  . Never Used    History  Alcohol Use No    Family History  Problem Relation Age of Onset  . Heart disease Father   . Heart attack Mother   . Heart attack Father   . Stroke Neg Hx     Review of Systems: The review of systems is per the HPI.  All other systems were reviewed and are negative.  Physical Exam: BP 110/64 mmHg  Pulse 72  Ht  5\' 6"  (1.676 m)  Wt 119 lb (53.978 kg)  BMI 19.22 kg/m2 Patient is very pleasant and in no acute distress. Skin is warm and dry. Color is normal.  HEENT is unremarkable. Normocephalic/atraumatic. PERRL. Sclera are nonicteric. Neck is supple. No masses. No JVD. She has a left carotid bruit. Lungs are clear. Cardiac exam shows a regular rate and rhythm. There are no murmurs or gallops. Abdomen is soft. Extremities are without edema. Gait and ROM are intact. No gross neurologic deficits noted.  LABORATORY DATA:   Lab Results  Component Value Date   WBC 5.9 09/23/2013   HGB 12.8 09/23/2013   HCT 36.2 09/23/2013   PLT 202.0 09/23/2013   GLUCOSE 119* 09/23/2013   CHOL 228* 03/06/2013   TRIG 69.0 03/06/2013   HDL 65.40 03/06/2013   LDLDIRECT 152.9 03/06/2013   LDLCALC 92 05/18/2011   ALT 18 03/06/2013   AST 20 03/06/2013   NA 139 09/23/2013   K 4.7 09/23/2013   CL 101 09/23/2013   CREATININE 1.5* 09/23/2013   BUN 55* 09/23/2013   CO2 26  09/23/2013   TSH 0.82 07/25/2012   INR 0.97 01/21/2011     Assessment / Plan: 1. Asymptomatic PACs/PVCs - continue with beta blocker.  2. Nonobstructive CAD - no symptoms reported.   3. Carotid disease - no symptoms reported. Carotid Doppler studies last year showed mild disease. Recommend followup every 3 years  4. Bladder cancer - followed at [facility]  5. HLD  6. Sleep apnea. Now on CPAP  7. Nonischemic cardiomyopathy EF 45%. Asymptomatic.   Will check lab work today including lipids, CBC and chemistries. I will follow up in one year.

## 2015-04-28 NOTE — Patient Instructions (Signed)
We will check lab work today  Continue your current medication  I will see you in one year

## 2015-04-29 ENCOUNTER — Other Ambulatory Visit: Payer: Self-pay

## 2015-04-29 DIAGNOSIS — I5022 Chronic systolic (congestive) heart failure: Secondary | ICD-10-CM

## 2015-04-29 DIAGNOSIS — I2510 Atherosclerotic heart disease of native coronary artery without angina pectoris: Secondary | ICD-10-CM

## 2015-04-29 DIAGNOSIS — E7800 Pure hypercholesterolemia, unspecified: Secondary | ICD-10-CM

## 2015-04-29 DIAGNOSIS — I6529 Occlusion and stenosis of unspecified carotid artery: Secondary | ICD-10-CM

## 2015-04-29 MED ORDER — ATORVASTATIN CALCIUM 20 MG PO TABS: 20.0000 mg | ORAL_TABLET | Freq: Every day | ORAL | Status: DC

## 2015-05-02 ENCOUNTER — Ambulatory Visit: Payer: Medicare Other | Admitting: Cardiology

## 2015-05-02 DIAGNOSIS — C679 Malignant neoplasm of bladder, unspecified: Secondary | ICD-10-CM | POA: Diagnosis not present

## 2015-05-02 DIAGNOSIS — I10 Essential (primary) hypertension: Secondary | ICD-10-CM | POA: Diagnosis not present

## 2015-05-02 DIAGNOSIS — Z681 Body mass index (BMI) 19 or less, adult: Secondary | ICD-10-CM | POA: Diagnosis not present

## 2015-05-02 DIAGNOSIS — D649 Anemia, unspecified: Secondary | ICD-10-CM | POA: Diagnosis not present

## 2015-05-02 DIAGNOSIS — K921 Melena: Secondary | ICD-10-CM | POA: Diagnosis not present

## 2015-05-03 DIAGNOSIS — M5136 Other intervertebral disc degeneration, lumbar region: Secondary | ICD-10-CM | POA: Diagnosis not present

## 2015-05-03 DIAGNOSIS — M4127 Other idiopathic scoliosis, lumbosacral region: Secondary | ICD-10-CM | POA: Diagnosis not present

## 2015-05-03 DIAGNOSIS — M9903 Segmental and somatic dysfunction of lumbar region: Secondary | ICD-10-CM | POA: Diagnosis not present

## 2015-05-03 DIAGNOSIS — D649 Anemia, unspecified: Secondary | ICD-10-CM | POA: Diagnosis not present

## 2015-05-05 ENCOUNTER — Other Ambulatory Visit (INDEPENDENT_AMBULATORY_CARE_PROVIDER_SITE_OTHER): Payer: Medicare Other

## 2015-05-05 ENCOUNTER — Ambulatory Visit (INDEPENDENT_AMBULATORY_CARE_PROVIDER_SITE_OTHER): Payer: Medicare Other | Admitting: Internal Medicine

## 2015-05-05 ENCOUNTER — Encounter: Payer: Self-pay | Admitting: Internal Medicine

## 2015-05-05 VITALS — BP 100/60 | HR 60 | Ht 63.5 in | Wt 122.1 lb

## 2015-05-05 DIAGNOSIS — I6529 Occlusion and stenosis of unspecified carotid artery: Secondary | ICD-10-CM

## 2015-05-05 DIAGNOSIS — Z85038 Personal history of other malignant neoplasm of large intestine: Secondary | ICD-10-CM | POA: Diagnosis not present

## 2015-05-05 DIAGNOSIS — Z79899 Other long term (current) drug therapy: Secondary | ICD-10-CM | POA: Diagnosis not present

## 2015-05-05 DIAGNOSIS — I2510 Atherosclerotic heart disease of native coronary artery without angina pectoris: Secondary | ICD-10-CM | POA: Diagnosis not present

## 2015-05-05 DIAGNOSIS — E7800 Pure hypercholesterolemia, unspecified: Secondary | ICD-10-CM

## 2015-05-05 DIAGNOSIS — D649 Anemia, unspecified: Secondary | ICD-10-CM

## 2015-05-05 LAB — BASIC METABOLIC PANEL
BUN: 42 mg/dL — ABNORMAL HIGH (ref 6–23)
CALCIUM: 9.4 mg/dL (ref 8.4–10.5)
CHLORIDE: 99 meq/L (ref 96–112)
CO2: 31 mEq/L (ref 19–32)
CREATININE: 1.44 mg/dL — AB (ref 0.40–1.20)
GFR: 36.77 mL/min — ABNORMAL LOW (ref >60.00)
Glucose, Bld: 104 mg/dL — ABNORMAL HIGH (ref 70–99)
Potassium: 4.4 mEq/L (ref 3.5–5.1)
Sodium: 137 mEq/L (ref 135–145)

## 2015-05-05 LAB — IRON: IRON: 94 ug/dL (ref 42–145)

## 2015-05-05 LAB — FERRITIN: Ferritin: 70.3 ng/mL (ref 10.0–291.0)

## 2015-05-05 LAB — VITAMIN B12: VITAMIN B 12: 289 pg/mL (ref 211–911)

## 2015-05-05 NOTE — Progress Notes (Signed)
Referred by: Burnard Bunting, MD  Subjective:    Patient ID: Paige Cook, female    DOB: 11/20/1928, 81 y.o.   MRN: [id number] Chief complaint: Anemia HPI This is a very nice elderly white woman previously followed by Dr. Sharlett Iles. She has a remote history of carcinoma of the colon treated with surgery by Dr. March Rummage in the 1980s. She had been followed and she had a colonoscopy in 2008, she had 2 tiny adenomas and repeat routine colonoscopy was not recommended. Hemoglobin 10, normocytic. She has not noted any bleeding. There is no bowel habit changes. I review of systems is otherwise negative. Allergies  Allergen Reactions  . Penicillins   . Tramadol     naseau and shakes   Outpatient Prescriptions Prior to Visit  Medication Sig Dispense Refill  . acetaminophen (TYLENOL) 325 MG tablet Take 650 mg by mouth as needed for pain.    Marland Kitchen aspirin 81 MG tablet Take 81 mg by mouth daily.      Marland Kitchen atorvastatin (LIPITOR) 20 MG tablet Take 1 tablet (20 mg total) by mouth daily. 30 tablet 6  . calcium-vitamin D (OSCAL WITH D) 500-200 MG-UNIT per tablet Take 1 tablet by mouth daily.    . carvedilol (COREG) 6.25 MG tablet Take 1 tablet (6.25 mg total) by mouth 2 (two) times daily with a meal. 60 tablet 5  . fish oil-omega-3 fatty acids 1000 MG capsule Take 1,000 mg by mouth daily.     . furosemide (LASIX) 40 MG tablet Take 1 tablet (40 mg total) by mouth daily. 30 tablet 9  . glucosamine-chondroitin 500-400 MG tablet Take 1 tablet by mouth daily.      . ramipril (ALTACE) 5 MG capsule TAKE 1 CAPSULE (5 MG TOTAL) BY MOUTH DAILY. 30 capsule 5  . vitamin C (ASCORBIC ACID) 500 MG tablet Take 500 mg by mouth daily.      No facility-administered medications prior to visit.   Past Medical History  Diagnosis Date  . CHF (congestive heart failure)     CHF with systolic dysfuntion ; dilated nonishemic cardiomyopathy  . Colon cancer 1989  . Carotid bruit     left  . Hypercholesteremia   . CAD (coronary  artery disease)   . Female bladder prolapse   . SOB (shortness of breath)     Sometimes at night  . Epistaxis   . Hyponatremia     Probably secondary to her CHF  . Bladder cancer     giant tumor  . OSA (obstructive sleep apnea) 04/29/2012  . Anemia   . Cardiac arrhythmia     benign  . Arthritis   . Colon polyps   . Diverticulosis   . Gallstones    Past Surgical History  Procedure Laterality Date  . Cardiac catheterization      non obstruction CAD  . Cholecystectomy    . Bladder tact    . Colectomy      sigmoid 1989 cancer  . Abdominal hysterectomy    . Bladder surgery  2013    cancer removed   History   Social History  . Marital Status: Married    Spouse Name: N/A  . Number of Children: 4  .     Occupational History  . retired Paramedic    Social History Main Topics  . Smoking status: Never Smoker   . Smokeless tobacco: Never Used  . Alcohol Use: No  . Drug Use: No  . Sexual Activity: Not Currently  Social History Narrative   Married, 1 son and 3 daughters.    One coffee a day no alcohol no tobacco   05/05/2015      Family History  Problem Relation Age of Onset  . Heart disease Father   . Heart attack Mother   . Heart attack Father   . Stroke Neg Hx   . Heart failure Sister 54        Review of Systems She has joint pains and back pain and suffers with issues from scoliosis. All other review of systems are negative or as per history of present illness.    Objective:   Physical Exam @BP  100/60 mmHg  Pulse 60  Ht 5' 3.5" (1.613 m)  Wt 122 lb 2 oz (55.396 kg)  BMI 21.29 kg/m2@  General:  Well-developed, well-nourished and in no acute distress Eyes:  anicteric. ENT:   Mouth and posterior pharynx free of lesions.sl pale  Neck:   supple w/o thyromegaly or mass.  Lungs: Clear to auscultation bilaterally. Heart:  S1S2, no rubs, murmurs, gallops. Abdomen:  soft, non-tender, no hepatosplenomegaly, hernia, or mass and BS+.  Ribs asymmetrical and palpable more in RUQ due to scoliosis Lymph:  no cervical or supraclavicular adenopathy. Extremities:   no edema, cyanosis or clubbing Skin   no rash. Neuro:  A&O x 3.  Psych:  appropriate mood and  Affect.   Data Reviewed: Prior colonoscopy reports. Cardiology notes and blood counts in the computer. Lab Results  Component Value Date   WBC 5.1 04/28/2015   HGB 10.9* 04/28/2015   HCT 32.8* 04/28/2015   MCV 95.6 04/28/2015   PLT 209 04/28/2015        Assessment & Plan:  Anemia, unspecified anemia type - Plan: Vitamin B12, Ferritin, Iron Binding Cap (TIBC), Iron, Hemoccult Cards (X3 cards)  Personal history of colon cancer - Plan: Hemoccult Cards (X3 cards)   I am not ready to pull the trigger on a colonoscopy for this decline in hemoglobin. It is new at least 1-2 years ago from the information I have. Her hemoglobin was normal in 2014. She has chronic kidney disease, she is normocytic. No obvious bleeding. Will evaluate with iron studies and a B12 level and also Hemoccults. If iron deficient or heme positive or both colonoscopy and EGD would be appropriate. Further plans pending these results. She is comfortable with this plan and understands my plan of workup.  I appreciate the opportunity to care for this patient. CC: Geoffery Lyons, MD Peter Martinique M.D.

## 2015-05-05 NOTE — Patient Instructions (Signed)
Your physician has requested that you go to the basement for the lab work before leaving today.  Today you are being given hemoccult cards to take home and complete and mail back.   I appreciate the opportunity to care for you. Silvano Rusk, M.D., [facility]

## 2015-05-06 ENCOUNTER — Telehealth: Payer: Self-pay | Admitting: Cardiology

## 2015-05-06 LAB — IBC PANEL
Iron: 94 ug/dL (ref 42–145)
SATURATION RATIOS: 27.1 % (ref 20.0–50.0)
Transferrin: 248 mg/dL (ref 212.0–360.0)

## 2015-05-06 NOTE — Telephone Encounter (Signed)
Lab results reported to patient. She voiced understanding.

## 2015-05-06 NOTE — Telephone Encounter (Signed)
Paige Cook is calling about her lab results . Please call .Marland Kitchen Thanks

## 2015-05-09 ENCOUNTER — Telehealth: Payer: Self-pay | Admitting: Cardiology

## 2015-05-09 NOTE — Telephone Encounter (Signed)
Returned call to patient she stated she received a call her hgb was 8.Stated she thought call was from this office.Advised I called her on 04/29/15 to report 04/28/15 hgb 10.9.Stated she received that lab in mail and she saw PCP who referred her to Dr.Gessner.Stated he drew more blood and she is waiting on results.Advised I don't see in her chart a 8 hgb.Advised to call Dr.Gessner.

## 2015-05-09 NOTE — Telephone Encounter (Signed)
Pt called in wanting to speak with Dr. Doug Sou nurse about her hemoglobin levels. Please f/u with her   Thanks

## 2015-05-10 DIAGNOSIS — M9903 Segmental and somatic dysfunction of lumbar region: Secondary | ICD-10-CM | POA: Diagnosis not present

## 2015-05-10 DIAGNOSIS — M5136 Other intervertebral disc degeneration, lumbar region: Secondary | ICD-10-CM | POA: Diagnosis not present

## 2015-05-10 DIAGNOSIS — M4127 Other idiopathic scoliosis, lumbosacral region: Secondary | ICD-10-CM | POA: Diagnosis not present

## 2015-05-11 NOTE — Progress Notes (Signed)
Quick Note:  B12 and iron studies are fine this is good news. Waiting on Hemoccults. ______

## 2015-05-17 DIAGNOSIS — M4127 Other idiopathic scoliosis, lumbosacral region: Secondary | ICD-10-CM | POA: Diagnosis not present

## 2015-05-17 DIAGNOSIS — M9903 Segmental and somatic dysfunction of lumbar region: Secondary | ICD-10-CM | POA: Diagnosis not present

## 2015-05-17 DIAGNOSIS — M5136 Other intervertebral disc degeneration, lumbar region: Secondary | ICD-10-CM | POA: Diagnosis not present

## 2015-05-18 ENCOUNTER — Other Ambulatory Visit (INDEPENDENT_AMBULATORY_CARE_PROVIDER_SITE_OTHER): Payer: Medicare Other

## 2015-05-18 DIAGNOSIS — Z85038 Personal history of other malignant neoplasm of large intestine: Secondary | ICD-10-CM

## 2015-05-18 DIAGNOSIS — D649 Anemia, unspecified: Secondary | ICD-10-CM

## 2015-05-19 ENCOUNTER — Encounter: Payer: Self-pay | Admitting: Gastroenterology

## 2015-05-19 LAB — HEMOCCULT SLIDES (X 3 CARDS)
Fecal Occult Blood: NEGATIVE
OCCULT 1: NEGATIVE
OCCULT 2: NEGATIVE
OCCULT 3: NEGATIVE
OCCULT 4: NEGATIVE
OCCULT 5: NEGATIVE

## 2015-05-20 NOTE — Progress Notes (Signed)
Quick Note:  No blood in stool Iron and B12 ok All this info goes against GI blood loss - most likely from chronic kidney disease Do not recommend endoscopic evaluation now   ______

## 2015-05-23 ENCOUNTER — Other Ambulatory Visit: Payer: Self-pay | Admitting: *Deleted

## 2015-05-23 MED ORDER — CARVEDILOL 6.25 MG PO TABS: 6.2500 mg | ORAL_TABLET | Freq: Two times a day (BID) | ORAL | Status: DC

## 2015-05-23 MED ORDER — RAMIPRIL 5 MG PO CAPS: ORAL_CAPSULE | ORAL | Status: DC

## 2015-05-24 DIAGNOSIS — M5136 Other intervertebral disc degeneration, lumbar region: Secondary | ICD-10-CM | POA: Diagnosis not present

## 2015-05-24 DIAGNOSIS — M9903 Segmental and somatic dysfunction of lumbar region: Secondary | ICD-10-CM | POA: Diagnosis not present

## 2015-05-24 DIAGNOSIS — M4127 Other idiopathic scoliosis, lumbosacral region: Secondary | ICD-10-CM | POA: Diagnosis not present

## 2015-06-02 DIAGNOSIS — M5136 Other intervertebral disc degeneration, lumbar region: Secondary | ICD-10-CM | POA: Diagnosis not present

## 2015-06-02 DIAGNOSIS — M4127 Other idiopathic scoliosis, lumbosacral region: Secondary | ICD-10-CM | POA: Diagnosis not present

## 2015-06-02 DIAGNOSIS — M9903 Segmental and somatic dysfunction of lumbar region: Secondary | ICD-10-CM | POA: Diagnosis not present

## 2015-08-01 DIAGNOSIS — I509 Heart failure, unspecified: Secondary | ICD-10-CM | POA: Diagnosis not present

## 2015-08-01 DIAGNOSIS — E780 Pure hypercholesterolemia: Secondary | ICD-10-CM | POA: Diagnosis not present

## 2015-08-01 DIAGNOSIS — I5022 Chronic systolic (congestive) heart failure: Secondary | ICD-10-CM | POA: Diagnosis not present

## 2015-08-02 LAB — HEPATIC FUNCTION PANEL
ALK PHOS: 60 U/L (ref 33–130)
ALT: 12 U/L (ref 6–29)
AST: 16 U/L (ref 10–35)
Albumin: 3.9 g/dL (ref 3.6–5.1)
BILIRUBIN INDIRECT: 0.4 mg/dL (ref 0.2–1.2)
Bilirubin, Direct: 0.1 mg/dL (ref <=0.2)
TOTAL PROTEIN: 6.6 g/dL (ref 6.1–8.1)
Total Bilirubin: 0.5 mg/dL (ref 0.2–1.2)

## 2015-08-02 LAB — LIPID PANEL
Cholesterol: 240 mg/dL — ABNORMAL HIGH (ref 125–200)
HDL: 69 mg/dL (ref >=46)
LDL CALC: 153 mg/dL — AB (ref <130)
Total CHOL/HDL Ratio: 3.5 Ratio (ref <=5.0)
Triglycerides: 91 mg/dL (ref <150)
VLDL: 18 mg/dL (ref <30)

## 2015-08-03 ENCOUNTER — Other Ambulatory Visit: Payer: Self-pay

## 2015-08-03 DIAGNOSIS — I491 Atrial premature depolarization: Secondary | ICD-10-CM

## 2015-08-03 DIAGNOSIS — I5022 Chronic systolic (congestive) heart failure: Secondary | ICD-10-CM

## 2015-08-03 DIAGNOSIS — I493 Ventricular premature depolarization: Secondary | ICD-10-CM

## 2015-08-03 DIAGNOSIS — E7800 Pure hypercholesterolemia, unspecified: Secondary | ICD-10-CM

## 2015-09-07 DIAGNOSIS — C679 Malignant neoplasm of bladder, unspecified: Secondary | ICD-10-CM | POA: Diagnosis not present

## 2015-10-04 ENCOUNTER — Other Ambulatory Visit: Payer: Self-pay

## 2015-10-04 MED ORDER — FUROSEMIDE 40 MG PO TABS: 40.0000 mg | ORAL_TABLET | Freq: Every day | ORAL | Status: DC

## 2015-11-03 DIAGNOSIS — E7800 Pure hypercholesterolemia, unspecified: Secondary | ICD-10-CM | POA: Diagnosis not present

## 2015-11-03 DIAGNOSIS — I5022 Chronic systolic (congestive) heart failure: Secondary | ICD-10-CM | POA: Diagnosis not present

## 2015-11-03 DIAGNOSIS — I509 Heart failure, unspecified: Secondary | ICD-10-CM | POA: Diagnosis not present

## 2015-11-03 DIAGNOSIS — I491 Atrial premature depolarization: Secondary | ICD-10-CM | POA: Diagnosis not present

## 2015-11-03 DIAGNOSIS — I493 Ventricular premature depolarization: Secondary | ICD-10-CM | POA: Diagnosis not present

## 2015-11-04 LAB — LIPID PANEL
Cholesterol: 149 mg/dL (ref 125–200)
HDL: 68 mg/dL (ref >=46)
LDL CALC: 66 mg/dL (ref <130)
Total CHOL/HDL Ratio: 2.2 Ratio (ref <=5.0)
Triglycerides: 74 mg/dL (ref <150)
VLDL: 15 mg/dL (ref <30)

## 2015-11-04 LAB — HEPATIC FUNCTION PANEL
ALBUMIN: 3.9 g/dL (ref 3.6–5.1)
ALT: 13 U/L (ref 6–29)
AST: 17 U/L (ref 10–35)
Alkaline Phosphatase: 71 U/L (ref 33–130)
Bilirubin, Direct: 0.1 mg/dL (ref <=0.2)
Indirect Bilirubin: 0.4 mg/dL (ref 0.2–1.2)
Total Bilirubin: 0.5 mg/dL (ref 0.2–1.2)
Total Protein: 6.2 g/dL (ref 6.1–8.1)

## 2015-12-19 DIAGNOSIS — Z23 Encounter for immunization: Secondary | ICD-10-CM | POA: Diagnosis not present

## 2016-03-14 DIAGNOSIS — C678 Malignant neoplasm of overlapping sites of bladder: Secondary | ICD-10-CM | POA: Diagnosis not present

## 2016-03-14 DIAGNOSIS — C679 Malignant neoplasm of bladder, unspecified: Secondary | ICD-10-CM | POA: Diagnosis not present

## 2016-03-14 DIAGNOSIS — N2889 Other specified disorders of kidney and ureter: Secondary | ICD-10-CM | POA: Diagnosis not present

## 2016-05-04 ENCOUNTER — Other Ambulatory Visit: Payer: Self-pay | Admitting: Cardiology

## 2016-05-17 ENCOUNTER — Other Ambulatory Visit: Payer: Self-pay

## 2016-05-17 ENCOUNTER — Other Ambulatory Visit: Payer: Self-pay | Admitting: Cardiology

## 2016-05-17 NOTE — Telephone Encounter (Signed)
Rx(s) sent to pharmacy electronically.  

## 2016-05-18 MED ORDER — CARVEDILOL 6.25 MG PO TABS: 6.2500 mg | ORAL_TABLET | Freq: Two times a day (BID) | ORAL | Status: DC

## 2016-05-18 NOTE — Telephone Encounter (Signed)
Rx(s) sent to pharmacy electronically.  

## 2016-06-06 DIAGNOSIS — E784 Other hyperlipidemia: Secondary | ICD-10-CM | POA: Diagnosis not present

## 2016-06-06 DIAGNOSIS — I10 Essential (primary) hypertension: Secondary | ICD-10-CM | POA: Diagnosis not present

## 2016-06-06 DIAGNOSIS — M859 Disorder of bone density and structure, unspecified: Secondary | ICD-10-CM | POA: Diagnosis not present

## 2016-06-06 DIAGNOSIS — M8580 Other specified disorders of bone density and structure, unspecified site: Secondary | ICD-10-CM | POA: Diagnosis not present

## 2016-06-08 DIAGNOSIS — I10 Essential (primary) hypertension: Secondary | ICD-10-CM | POA: Diagnosis not present

## 2016-06-08 DIAGNOSIS — R8290 Unspecified abnormal findings in urine: Secondary | ICD-10-CM | POA: Diagnosis not present

## 2016-06-08 DIAGNOSIS — N390 Urinary tract infection, site not specified: Secondary | ICD-10-CM | POA: Diagnosis not present

## 2016-06-13 DIAGNOSIS — I10 Essential (primary) hypertension: Secondary | ICD-10-CM | POA: Diagnosis not present

## 2016-06-13 DIAGNOSIS — N184 Chronic kidney disease, stage 4 (severe): Secondary | ICD-10-CM | POA: Diagnosis not present

## 2016-06-13 DIAGNOSIS — I129 Hypertensive chronic kidney disease with stage 1 through stage 4 chronic kidney disease, or unspecified chronic kidney disease: Secondary | ICD-10-CM | POA: Diagnosis not present

## 2016-06-13 DIAGNOSIS — G4733 Obstructive sleep apnea (adult) (pediatric): Secondary | ICD-10-CM | POA: Diagnosis not present

## 2016-06-13 DIAGNOSIS — Z1389 Encounter for screening for other disorder: Secondary | ICD-10-CM | POA: Diagnosis not present

## 2016-06-13 DIAGNOSIS — E784 Other hyperlipidemia: Secondary | ICD-10-CM | POA: Diagnosis not present

## 2016-06-13 DIAGNOSIS — I2510 Atherosclerotic heart disease of native coronary artery without angina pectoris: Secondary | ICD-10-CM | POA: Diagnosis not present

## 2016-06-13 DIAGNOSIS — M5136 Other intervertebral disc degeneration, lumbar region: Secondary | ICD-10-CM | POA: Diagnosis not present

## 2016-06-13 DIAGNOSIS — Z0000 Encounter for general adult medical examination without abnormal findings: Secondary | ICD-10-CM | POA: Diagnosis not present

## 2016-06-13 DIAGNOSIS — M859 Disorder of bone density and structure, unspecified: Secondary | ICD-10-CM | POA: Diagnosis not present

## 2016-06-13 DIAGNOSIS — I491 Atrial premature depolarization: Secondary | ICD-10-CM | POA: Diagnosis not present

## 2016-06-13 DIAGNOSIS — D638 Anemia in other chronic diseases classified elsewhere: Secondary | ICD-10-CM | POA: Diagnosis not present

## 2016-06-25 ENCOUNTER — Ambulatory Visit (INDEPENDENT_AMBULATORY_CARE_PROVIDER_SITE_OTHER): Payer: Medicare Other | Admitting: Cardiology

## 2016-06-25 ENCOUNTER — Telehealth: Payer: Self-pay

## 2016-06-25 ENCOUNTER — Encounter: Payer: Self-pay | Admitting: Cardiology

## 2016-06-25 VITALS — BP 102/52 | HR 66 | Ht 65.0 in | Wt 115.2 lb

## 2016-06-25 DIAGNOSIS — I493 Ventricular premature depolarization: Secondary | ICD-10-CM | POA: Diagnosis not present

## 2016-06-25 DIAGNOSIS — I2510 Atherosclerotic heart disease of native coronary artery without angina pectoris: Secondary | ICD-10-CM

## 2016-06-25 DIAGNOSIS — E7800 Pure hypercholesterolemia, unspecified: Secondary | ICD-10-CM

## 2016-06-25 NOTE — Progress Notes (Signed)
Paige Cook Cook Date of Birth: October 26, 1928 Medical Record [id number]  History of Present Illness: Paige Cook Cook is seen for follow up Cardiac disease. She has mild LV dysfunction with EF 45% and very frequent PACs on past Holter. No atrial fib has been identified. Other issues include nonobstructive CAD, PVCs, HLD, bladder cancer, and OSA. Has 0 to 39% bilateral carotid disease with last duplex in October 2014.  She has a history of sleep apnea and using CPAP therapy. She did have surgery for bladder cancer and 2013 and then underwent BCG treatment in 2014. She reports that she is currently cancer free. She reports that she is feeling very well. Exercises daily. Stays very active.  No chest pain or dyspnea. No palpitations. She reports recent lab work by Paige Cook Cook showed some abnormality and she was told her lasix may need to be reduced. She does not want to change because she has done so well on her current regimen.  Current Outpatient Prescriptions on File Prior to Visit  Medication Sig Dispense Refill  . acetaminophen (TYLENOL) 325 MG tablet Take 650 mg by mouth as needed for pain.    Marland Kitchen aspirin 81 MG tablet Take 81 mg by mouth daily.      Marland Kitchen atorvastatin (LIPITOR) 20 MG tablet Take 1 tablet (20 mg total) by mouth daily. 30 tablet 6  . calcium-vitamin D (OSCAL WITH D) 500-200 MG-UNIT per tablet Take 1 tablet by mouth daily.    . carvedilol (COREG) 6.25 MG tablet Take 1 tablet (6.25 mg total) by mouth 2 (two) times daily with a meal. 180 tablet 1  . fish oil-omega-3 fatty acids 1000 MG capsule Take 1,000 mg by mouth daily.     . furosemide (LASIX) 40 MG tablet TAKE 1 TABLET (40 MG TOTAL) BY MOUTH DAILY. 90 tablet 1  . glucosamine-chondroitin 500-400 MG tablet Take 1 tablet by mouth daily.      . ramipril (ALTACE) 5 MG capsule TAKE 1 CAPSULE (5 MG TOTAL) BY MOUTH DAILY. 90 capsule 2  . vitamin C (ASCORBIC ACID) 500 MG tablet Take 500 mg by mouth daily.      No current facility-administered  medications on file prior to visit.    Allergies  Allergen Reactions  . Penicillins   . Tramadol     naseau and shakes    Past Medical History  Diagnosis Date  . CHF (congestive heart failure)     CHF with systolic dysfuntion ; dilated nonishemic cardiomyopathy  . Colon cancer 1989  . Carotid bruit     left  . Hypercholesteremia   . CAD (coronary artery disease)   . Female bladder prolapse   . SOB (shortness of breath)     Sometimes at night  . Epistaxis   . Hyponatremia     Probably secondary to her CHF  . Bladder cancer     giant tumor  . OSA (obstructive sleep apnea) 04/29/2012  . Anemia   . Cardiac arrhythmia     benign  . Arthritis   . Colon polyps   . Diverticulosis   . Gallstones     Past Surgical History  Procedure Laterality Date  . Cardiac catheterization      non obstruction CAD  . Cholecystectomy    . Bladder tact    . Colectomy      sigmoid 1989 cancer  . Abdominal hysterectomy    . Bladder surgery  2013    cancer removed  . Colonoscopy  History  Smoking status  . Never Smoker   Smokeless tobacco  . Never Used    History  Alcohol Use No    Family History  Problem Relation Age of Onset  . Heart disease Father   . Heart attack Mother   . Heart attack Father   . Stroke Neg Hx   . Heart failure Sister 4    Review of Systems: The review of systems is per the HPI.  All other systems were reviewed and are negative.  Physical Exam: There were no vitals taken for this visit. Patient is very pleasant and in no acute distress. Skin is warm and dry. Color is normal.  HEENT is unremarkable. Normocephalic/atraumatic. PERRL. Sclera are nonicteric. Neck is supple. No masses. No JVD. She has a left carotid bruit. Lungs are clear. Cardiac exam shows a regular rate and rhythm. There are no murmurs or gallops. Abdomen is soft. Extremities are without edema. Gait and ROM are intact. No gross neurologic deficits noted.  LABORATORY DATA:   Lab  Results  Component Value Date   WBC 5.1 04/28/2015   HGB 10.9* 04/28/2015   HCT 32.8* 04/28/2015   PLT 209 04/28/2015   GLUCOSE 104* 05/05/2015   CHOL 149 11/03/2015   TRIG 74 11/03/2015   HDL 68 11/03/2015   LDLDIRECT 152.9 03/06/2013   LDLCALC 66 11/03/2015   ALT 13 11/03/2015   AST 17 11/03/2015   NA 137 05/05/2015   K 4.4 05/05/2015   CL 99 05/05/2015   CREATININE 1.44* 05/05/2015   BUN 42* 05/05/2015   CO2 31 05/05/2015   TSH 0.82 07/25/2012   INR 0.97 01/21/2011   Ecg today shows NSR with frequent PVCs in a pattern of bigeminy. Nonspecific ST-T wave abnormality. I have personally reviewed and interpreted this study.   Assessment / Plan: 1. Asymptomatic PACs/PVCs - continue with beta blocker.  2. Nonobstructive CAD - no symptoms reported.   3. Carotid disease - no symptoms reported. Last carotid dopplers showed only mild disease. Given her advanced age I don't recommend repeat dopplers unless she is symptomatic.   4. Bladder cancer - followed at [facility]  5. HLD  6. Sleep apnea. Now on CPAP  7. Nonischemic cardiomyopathy EF 45%. Asymptomatic.   Will request lab work from Paige Cook Cook. Otherwise follow up in one year.

## 2016-06-25 NOTE — Addendum Note (Signed)
Addended by: Kathyrn Lass on: 06/25/2016 12:01 PM   Modules accepted: Orders

## 2016-06-25 NOTE — Telephone Encounter (Signed)
Patient called Dr.Jordan received 06/25/16 lab work from Dr.Aronson's office.He advised decrease lasix to 20 mg daily.

## 2016-06-25 NOTE — Patient Instructions (Addendum)
Continue your current therapy  We will request your lab work from Dr. Reynaldo Minium.  I will see you in one year

## 2016-10-27 DIAGNOSIS — Z23 Encounter for immunization: Secondary | ICD-10-CM | POA: Diagnosis not present

## 2016-11-15 ENCOUNTER — Other Ambulatory Visit: Payer: Self-pay | Admitting: Cardiology

## 2016-11-28 DIAGNOSIS — I491 Atrial premature depolarization: Secondary | ICD-10-CM | POA: Diagnosis not present

## 2016-11-28 DIAGNOSIS — I2510 Atherosclerotic heart disease of native coronary artery without angina pectoris: Secondary | ICD-10-CM | POA: Diagnosis not present

## 2016-11-28 DIAGNOSIS — M5136 Other intervertebral disc degeneration, lumbar region: Secondary | ICD-10-CM | POA: Diagnosis not present

## 2016-11-28 DIAGNOSIS — N184 Chronic kidney disease, stage 4 (severe): Secondary | ICD-10-CM | POA: Diagnosis not present

## 2016-11-28 DIAGNOSIS — I129 Hypertensive chronic kidney disease with stage 1 through stage 4 chronic kidney disease, or unspecified chronic kidney disease: Secondary | ICD-10-CM | POA: Diagnosis not present

## 2016-11-28 DIAGNOSIS — Z8551 Personal history of malignant neoplasm of bladder: Secondary | ICD-10-CM | POA: Diagnosis not present

## 2016-11-28 DIAGNOSIS — I10 Essential (primary) hypertension: Secondary | ICD-10-CM | POA: Diagnosis not present

## 2016-11-28 DIAGNOSIS — G4733 Obstructive sleep apnea (adult) (pediatric): Secondary | ICD-10-CM | POA: Diagnosis not present

## 2016-11-28 DIAGNOSIS — E784 Other hyperlipidemia: Secondary | ICD-10-CM | POA: Diagnosis not present

## 2016-11-28 DIAGNOSIS — D638 Anemia in other chronic diseases classified elsewhere: Secondary | ICD-10-CM | POA: Diagnosis not present

## 2016-11-28 DIAGNOSIS — Z681 Body mass index (BMI) 19 or less, adult: Secondary | ICD-10-CM | POA: Diagnosis not present

## 2016-11-28 DIAGNOSIS — M859 Disorder of bone density and structure, unspecified: Secondary | ICD-10-CM | POA: Diagnosis not present

## 2016-12-20 DIAGNOSIS — M9903 Segmental and somatic dysfunction of lumbar region: Secondary | ICD-10-CM | POA: Diagnosis not present

## 2016-12-20 DIAGNOSIS — M4726 Other spondylosis with radiculopathy, lumbar region: Secondary | ICD-10-CM | POA: Diagnosis not present

## 2016-12-24 DIAGNOSIS — M9903 Segmental and somatic dysfunction of lumbar region: Secondary | ICD-10-CM | POA: Diagnosis not present

## 2016-12-24 DIAGNOSIS — M4726 Other spondylosis with radiculopathy, lumbar region: Secondary | ICD-10-CM | POA: Diagnosis not present

## 2016-12-25 DIAGNOSIS — M9903 Segmental and somatic dysfunction of lumbar region: Secondary | ICD-10-CM | POA: Diagnosis not present

## 2016-12-25 DIAGNOSIS — M4726 Other spondylosis with radiculopathy, lumbar region: Secondary | ICD-10-CM | POA: Diagnosis not present

## 2016-12-26 DIAGNOSIS — M4726 Other spondylosis with radiculopathy, lumbar region: Secondary | ICD-10-CM | POA: Diagnosis not present

## 2016-12-26 DIAGNOSIS — M9903 Segmental and somatic dysfunction of lumbar region: Secondary | ICD-10-CM | POA: Diagnosis not present

## 2016-12-31 DIAGNOSIS — M4726 Other spondylosis with radiculopathy, lumbar region: Secondary | ICD-10-CM | POA: Diagnosis not present

## 2016-12-31 DIAGNOSIS — M9903 Segmental and somatic dysfunction of lumbar region: Secondary | ICD-10-CM | POA: Diagnosis not present

## 2017-01-01 DIAGNOSIS — M9903 Segmental and somatic dysfunction of lumbar region: Secondary | ICD-10-CM | POA: Diagnosis not present

## 2017-01-01 DIAGNOSIS — M4726 Other spondylosis with radiculopathy, lumbar region: Secondary | ICD-10-CM | POA: Diagnosis not present

## 2017-01-07 DIAGNOSIS — M4726 Other spondylosis with radiculopathy, lumbar region: Secondary | ICD-10-CM | POA: Diagnosis not present

## 2017-01-07 DIAGNOSIS — M9903 Segmental and somatic dysfunction of lumbar region: Secondary | ICD-10-CM | POA: Diagnosis not present

## 2017-01-08 DIAGNOSIS — M4726 Other spondylosis with radiculopathy, lumbar region: Secondary | ICD-10-CM | POA: Diagnosis not present

## 2017-01-08 DIAGNOSIS — M9903 Segmental and somatic dysfunction of lumbar region: Secondary | ICD-10-CM | POA: Diagnosis not present

## 2017-01-09 DIAGNOSIS — M4726 Other spondylosis with radiculopathy, lumbar region: Secondary | ICD-10-CM | POA: Diagnosis not present

## 2017-01-09 DIAGNOSIS — M9903 Segmental and somatic dysfunction of lumbar region: Secondary | ICD-10-CM | POA: Diagnosis not present

## 2017-01-14 DIAGNOSIS — M9903 Segmental and somatic dysfunction of lumbar region: Secondary | ICD-10-CM | POA: Diagnosis not present

## 2017-01-14 DIAGNOSIS — M4726 Other spondylosis with radiculopathy, lumbar region: Secondary | ICD-10-CM | POA: Diagnosis not present

## 2017-01-16 DIAGNOSIS — M9903 Segmental and somatic dysfunction of lumbar region: Secondary | ICD-10-CM | POA: Diagnosis not present

## 2017-01-16 DIAGNOSIS — M4726 Other spondylosis with radiculopathy, lumbar region: Secondary | ICD-10-CM | POA: Diagnosis not present

## 2017-01-22 DIAGNOSIS — M9903 Segmental and somatic dysfunction of lumbar region: Secondary | ICD-10-CM | POA: Diagnosis not present

## 2017-01-22 DIAGNOSIS — M4726 Other spondylosis with radiculopathy, lumbar region: Secondary | ICD-10-CM | POA: Diagnosis not present

## 2017-01-23 DIAGNOSIS — M4726 Other spondylosis with radiculopathy, lumbar region: Secondary | ICD-10-CM | POA: Diagnosis not present

## 2017-01-23 DIAGNOSIS — M9903 Segmental and somatic dysfunction of lumbar region: Secondary | ICD-10-CM | POA: Diagnosis not present

## 2017-02-20 ENCOUNTER — Other Ambulatory Visit: Payer: Self-pay | Admitting: Cardiology

## 2017-02-21 DIAGNOSIS — M67471 Ganglion, right ankle and foot: Secondary | ICD-10-CM | POA: Diagnosis not present

## 2017-03-12 ENCOUNTER — Encounter (HOSPITAL_COMMUNITY): Payer: Self-pay | Admitting: Emergency Medicine

## 2017-03-12 ENCOUNTER — Inpatient Hospital Stay (HOSPITAL_COMMUNITY)
Admission: EM | Admit: 2017-03-12 | Discharge: 2017-03-15 | DRG: 389 | Disposition: A | Discharge status: Home / Self Care | Payer: Medicare Other | Attending: Internal Medicine | Admitting: Internal Medicine

## 2017-03-12 ENCOUNTER — Emergency Department (HOSPITAL_COMMUNITY): Payer: Medicare Other

## 2017-03-12 ENCOUNTER — Observation Stay (HOSPITAL_COMMUNITY): Payer: Medicare Other

## 2017-03-12 DIAGNOSIS — Z885 Allergy status to narcotic agent status: Secondary | ICD-10-CM

## 2017-03-12 DIAGNOSIS — I420 Dilated cardiomyopathy: Secondary | ICD-10-CM | POA: Diagnosis present

## 2017-03-12 DIAGNOSIS — R1030 Lower abdominal pain, unspecified: Secondary | ICD-10-CM | POA: Diagnosis present

## 2017-03-12 DIAGNOSIS — Z880 Allergy status to penicillin: Secondary | ICD-10-CM

## 2017-03-12 DIAGNOSIS — N183 Chronic kidney disease, stage 3 unspecified: Secondary | ICD-10-CM

## 2017-03-12 DIAGNOSIS — D72829 Elevated white blood cell count, unspecified: Secondary | ICD-10-CM | POA: Diagnosis present

## 2017-03-12 DIAGNOSIS — K5669 Other partial intestinal obstruction: Secondary | ICD-10-CM | POA: Diagnosis not present

## 2017-03-12 DIAGNOSIS — M1990 Unspecified osteoarthritis, unspecified site: Secondary | ICD-10-CM | POA: Diagnosis present

## 2017-03-12 DIAGNOSIS — Z9989 Dependence on other enabling machines and devices: Secondary | ICD-10-CM

## 2017-03-12 DIAGNOSIS — Z85038 Personal history of other malignant neoplasm of large intestine: Secondary | ICD-10-CM

## 2017-03-12 DIAGNOSIS — K56609 Unspecified intestinal obstruction, unspecified as to partial versus complete obstruction: Secondary | ICD-10-CM | POA: Diagnosis present

## 2017-03-12 DIAGNOSIS — Z7982 Long term (current) use of aspirin: Secondary | ICD-10-CM

## 2017-03-12 DIAGNOSIS — I10 Essential (primary) hypertension: Secondary | ICD-10-CM

## 2017-03-12 DIAGNOSIS — K56600 Partial intestinal obstruction, unspecified as to cause: Secondary | ICD-10-CM | POA: Diagnosis not present

## 2017-03-12 DIAGNOSIS — E7800 Pure hypercholesterolemia, unspecified: Secondary | ICD-10-CM | POA: Diagnosis not present

## 2017-03-12 DIAGNOSIS — N184 Chronic kidney disease, stage 4 (severe): Secondary | ICD-10-CM

## 2017-03-12 DIAGNOSIS — Z9221 Personal history of antineoplastic chemotherapy: Secondary | ICD-10-CM

## 2017-03-12 DIAGNOSIS — K2970 Gastritis, unspecified, without bleeding: Secondary | ICD-10-CM | POA: Diagnosis not present

## 2017-03-12 DIAGNOSIS — Z9049 Acquired absence of other specified parts of digestive tract: Secondary | ICD-10-CM

## 2017-03-12 DIAGNOSIS — I5022 Chronic systolic (congestive) heart failure: Secondary | ICD-10-CM | POA: Diagnosis not present

## 2017-03-12 DIAGNOSIS — Z79899 Other long term (current) drug therapy: Secondary | ICD-10-CM

## 2017-03-12 DIAGNOSIS — I130 Hypertensive heart and chronic kidney disease with heart failure and stage 1 through stage 4 chronic kidney disease, or unspecified chronic kidney disease: Secondary | ICD-10-CM | POA: Diagnosis present

## 2017-03-12 DIAGNOSIS — M419 Scoliosis, unspecified: Secondary | ICD-10-CM | POA: Diagnosis not present

## 2017-03-12 DIAGNOSIS — I2510 Atherosclerotic heart disease of native coronary artery without angina pectoris: Secondary | ICD-10-CM | POA: Diagnosis present

## 2017-03-12 DIAGNOSIS — R109 Unspecified abdominal pain: Secondary | ICD-10-CM | POA: Diagnosis not present

## 2017-03-12 DIAGNOSIS — G4733 Obstructive sleep apnea (adult) (pediatric): Secondary | ICD-10-CM | POA: Diagnosis not present

## 2017-03-12 DIAGNOSIS — E785 Hyperlipidemia, unspecified: Secondary | ICD-10-CM | POA: Diagnosis present

## 2017-03-12 DIAGNOSIS — R110 Nausea: Secondary | ICD-10-CM | POA: Diagnosis not present

## 2017-03-12 LAB — COMPREHENSIVE METABOLIC PANEL
ALT: 16 U/L (ref 14–54)
AST: 26 U/L (ref 15–41)
Albumin: 4.6 g/dL (ref 3.5–5.0)
Alkaline Phosphatase: 87 U/L (ref 38–126)
Anion gap: 13 (ref 5–15)
BILIRUBIN TOTAL: 0.9 mg/dL (ref 0.3–1.2)
BUN: 63 mg/dL — ABNORMAL HIGH (ref 6–20)
CALCIUM: 10 mg/dL (ref 8.9–10.3)
CO2: 31 mmol/L (ref 22–32)
CREATININE: 1.67 mg/dL — AB (ref 0.44–1.00)
Chloride: 97 mmol/L — ABNORMAL LOW (ref 101–111)
GFR, EST AFRICAN AMERICAN: 31 mL/min — AB (ref >60)
GFR, EST NON AFRICAN AMERICAN: 27 mL/min — AB (ref >60)
Glucose, Bld: 158 mg/dL — ABNORMAL HIGH (ref 65–99)
Potassium: 4.4 mmol/L (ref 3.5–5.1)
Sodium: 141 mmol/L (ref 135–145)
TOTAL PROTEIN: 8 g/dL (ref 6.5–8.1)

## 2017-03-12 LAB — CBC
HCT: 38.3 % (ref 36.0–46.0)
Hemoglobin: 13.1 g/dL (ref 12.0–15.0)
MCH: 31.6 pg (ref 26.0–34.0)
MCHC: 34.2 g/dL (ref 30.0–36.0)
MCV: 92.3 fL (ref 78.0–100.0)
PLATELETS: 206 10*3/uL (ref 150–400)
RBC: 4.15 MIL/uL (ref 3.87–5.11)
RDW: 13.1 % (ref 11.5–15.5)
WBC: 10.7 10*3/uL — AB (ref 4.0–10.5)

## 2017-03-12 LAB — URINALYSIS, ROUTINE W REFLEX MICROSCOPIC
Bacteria, UA: NONE SEEN
Bilirubin Urine: NEGATIVE
Glucose, UA: NEGATIVE mg/dL
HGB URINE DIPSTICK: NEGATIVE
Ketones, ur: 5 mg/dL — AB
NITRITE: NEGATIVE
PH: 6 (ref 5.0–8.0)
Protein, ur: NEGATIVE mg/dL
SPECIFIC GRAVITY, URINE: 1.016 (ref 1.005–1.030)

## 2017-03-12 LAB — LIPASE, BLOOD: LIPASE: 29 U/L (ref 11–51)

## 2017-03-12 MED ORDER — IOPAMIDOL (ISOVUE-300) INJECTION 61%
INTRAVENOUS | Status: AC
  Administered 2017-03-12: 30 mL via ORAL
  Filled 2017-03-12: qty 30

## 2017-03-12 MED ORDER — IOPAMIDOL (ISOVUE-300) INJECTION 61%
30.0000 mL | Freq: Once | INTRAVENOUS | Status: AC | PRN
  Administered 2017-03-12: 30 mL via ORAL

## 2017-03-12 MED ORDER — LIDOCAINE VISCOUS 2 % MT SOLN: 15.0000 mL | Freq: Once | OROMUCOSAL | Status: DC

## 2017-03-12 MED ORDER — LACTATED RINGERS IV SOLN
INTRAVENOUS | Status: AC
  Administered 2017-03-12: 12:00:00 via INTRAVENOUS

## 2017-03-12 MED ORDER — ONDANSETRON HCL 4 MG/2ML IJ SOLN
4.0000 mg | Freq: Once | INTRAMUSCULAR | Status: AC
  Administered 2017-03-12: 4 mg via INTRAVENOUS
  Filled 2017-03-12: qty 2

## 2017-03-12 MED ORDER — SODIUM CHLORIDE 0.9 % IV BOLUS (SEPSIS)
1000.0000 mL | Freq: Once | INTRAVENOUS | Status: AC
  Administered 2017-03-12: 1000 mL via INTRAVENOUS

## 2017-03-12 MED ORDER — HEPARIN SODIUM (PORCINE) 5000 UNIT/ML IJ SOLN
5000.0000 [IU] | Freq: Three times a day (TID) | INTRAMUSCULAR | Status: DC
  Administered 2017-03-12 – 2017-03-15 (×9): 5000 [IU] via SUBCUTANEOUS
  Filled 2017-03-12 (×8): qty 1

## 2017-03-12 MED ORDER — LIDOCAINE HCL 2 % EX GEL
1.0000 "application " | Freq: Once | CUTANEOUS | Status: AC
  Administered 2017-03-12: 1
  Filled 2017-03-12: qty 11

## 2017-03-12 MED ORDER — HYDRALAZINE HCL 20 MG/ML IJ SOLN: 5.0000 mg | INTRAMUSCULAR | Status: DC | PRN

## 2017-03-12 MED ORDER — FENTANYL CITRATE (PF) 100 MCG/2ML IJ SOLN: 50.0000 ug | Freq: Once | INTRAMUSCULAR | Status: DC

## 2017-03-12 NOTE — H&P (Signed)
History and Physical    Paige Cook JME:[id number] DOB: 10/18/1928 DOA: 03/12/2017  I have briefly reviewed the patient's prior medical records in [place]  PCP: Geoffery Lyons, MD  Patient coming from: Home  Chief Complaint: Abdominal pain, nausea and vomiting  HPI: Paige Cook is a 83 y.o. female with medical history significant of chronic systolic CHF, nonobstructive coronary artery disease, bladder cancer, colon cancer status post surgery, hypertension, hyperlipidemia, presents to the emergency room with chief complaint of abdominal pain and nausea and vomiting.  Patient has noticed that she has been having worsening epigastric abdominal pain that started last night.  She was awakened at 2 AM this morning with severe nausea and has had few episodes of nonbloody vomiting.  This has never happened to her before.  She is otherwise relatively healthy, she denies any chest pains, she has no shortness of breath.  She denies any fever or chills.  She has no lightheadedness or dizziness.  ED Course: In the emergency room her vital signs are stable, blood work is remarkable for creatinine of 1.67, mild leukocytosis with a white count of 10.7.  She underwent a CT scan of the abdomen and pelvis which showed small bowel obstruction.  General surgery was consulted, and TRH was asked for admission due to concurrent multiple medical problems.  Review of Systems: As per HPI otherwise 10 point review of systems negative.   Past Medical History:  Diagnosis Date  . Anemia   . Arthritis   . Bladder cancer ([place])    giant tumor  . CAD (coronary artery disease)   . Cardiac arrhythmia    benign  . Carotid bruit    left  . CHF (congestive heart failure) (HCC)    CHF with systolic dysfuntion ; dilated nonishemic cardiomyopathy  . Colon cancer ([place]) 1989  . Colon polyps   . Diverticulosis   . Epistaxis   . Female bladder prolapse   . Gallstones   . Hypercholesteremia   . Hyponatremia      Probably secondary to her CHF  . OSA (obstructive sleep apnea) 04/29/2012  . SOB (shortness of breath)    Sometimes at night    Past Surgical History:  Procedure Laterality Date  . ABDOMINAL HYSTERECTOMY    . BLADDER SURGERY  2013   cancer removed  . bladder tact    . CARDIAC CATHETERIZATION     non obstruction CAD  . CHOLECYSTECTOMY    . 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 COLONOSCOPY       reports that she has never smoked. She has never used smokeless tobacco. She reports that she does not drink alcohol or use drugs.  Allergies  Allergen Reactions  . Penicillins Hives    Has patient had a PCN reaction causing immediate rash, facial/tongue/throat swelling, SOB or lightheadedness with hypotension: Yes Has patient had a PCN reaction causing severe rash involving mucus membranes or skin necrosis: Yes Has patient had a PCN reaction that required hospitalization No Has patient had a PCN reaction occurring within the last 10 years: No If all of the above answers are "NO", then may proceed with Cephalosporin use.   . Tramadol Nausea Only and Other (See Comments)    shakes    Family History  Problem Relation Age of Onset  . Heart disease Father   . Heart attack Father   . Heart attack Mother   . Heart failure Sister 6  . Stroke Neg  Hx     Prior to Admission medications   Medication Sig Start Date End Date Taking? Authorizing Provider  aspirin 81 MG chewable tablet Chew 81 mg by mouth daily with breakfast.   Yes Historical Provider, MD  calcium-vitamin D (OSCAL WITH D) 500-200 MG-UNIT per tablet Take 1 tablet by mouth every evening.    Yes Historical Provider, MD  carvedilol (COREG) 6.25 MG tablet TAKE 1 TABLET BY MOUTH TWICE A DAY WITH A MEAL. Patient taking differently: TAKE 1 TABLET BY MOUTH TWICE A DAY WITH A MEAL 11/15/16  Yes Peter M Martinique, MD  furosemide (LASIX) 40 MG tablet TAKE 1 TABLET (40 MG TOTAL) BY MOUTH DAILY. Patient taking differently: TAKE 20 mg BY  MOUTH DAILY. 11/15/16  Yes Peter M Martinique, MD  glucosamine-chondroitin 500-400 MG tablet Take 1 tablet by mouth daily with breakfast.    Yes Historical Provider, MD  ramipril (ALTACE) 5 MG capsule TAKE 1 CAPSULE (5 MG TOTAL) BY MOUTH DAILY. Patient taking differently: TAKE 1 CAPSULE (5 MG TOTAL) BY MOUTH every evening 02/20/17  Yes Peter M Martinique, MD  vitamin C (ASCORBIC ACID) 250 MG tablet Take 250 mg by mouth daily with breakfast.   Yes Historical Provider, MD  atorvastatin (LIPITOR) 20 MG tablet Take 1 tablet (20 mg total) by mouth daily. Patient not taking: Reported on 03/12/2017 04/29/15   Peter M Martinique, MD    Physical Exam: Vitals:   03/12/17 0730 03/12/17 0800 03/12/17 0916 03/12/17 0930  BP: 136/64 134/77 (!) 156/82 (!) 149/67  Pulse: 88 92 88 91  Resp: 16 14 16 16   Temp:      TempSrc:      SpO2: 93% 94% 94% 92%  Weight:      Height:          Constitutional: NAD, calm, comfortable, NG tube in place Vitals:   03/12/17 0730 03/12/17 0800 03/12/17 0916 03/12/17 0930  BP: 136/64 134/77 (!) 156/82 (!) 149/67  Pulse: 88 92 88 91  Resp: 16 14 16 16   Temp:      TempSrc:      SpO2: 93% 94% 94% 92%  Weight:      Height:       Eyes: PERRL, lids and conjunctivae normal ENMT: Mucous membranes are moist. Posterior pharynx clear of any exudate or lesions.  Neck: normal, supple, no masses, no thyromegaly Respiratory: clear to auscultation bilaterally, no wheezing, no crackles. Normal respiratory effort. No accessory muscle use.  Cardiovascular: Regular rate and rhythm, no murmurs / rubs / gallops. No extremity edema. 2+ pedal pulses.  Abdomen: + tenderness, no masses palpated. Bowel sounds decreased.  Musculoskeletal: no clubbing / cyanosis. Normal muscle tone.  Skin: no rashes, lesions, ulcers. No induration Neurologic: CN 2-12 grossly intact. Strength 5/5 in all 4.  Psychiatric: Normal judgment and insight. Alert and oriented x 3. Normal mood.   Labs on Admission: I have  personally reviewed following labs and imaging studies  CBC:  Recent Labs Lab 03/12/17 0623  WBC 10.7*  HGB 13.1  HCT 38.3  MCV 92.3  PLT 950   Basic Metabolic Panel:  Recent Labs Lab 03/12/17 0623  NA 141  K 4.4  CL 97*  CO2 31  GLUCOSE 158*  BUN 63*  CREATININE 1.67*  CALCIUM 10.0   GFR: Estimated Creatinine Clearance: 19.6 mL/min (A) (by C-G formula based on SCr of 1.67 mg/dL (H)). Liver Function Tests:  Recent Labs Lab 03/12/17 0623  AST 26  ALT 16  ALKPHOS 87  BILITOT 0.9  PROT 8.0  ALBUMIN 4.6    Recent Labs Lab 03/12/17 0623  LIPASE 29   No results for input(s): AMMONIA in the last 168 hours. Coagulation Profile: No results for input(s): INR, PROTIME in the last 168 hours. Cardiac Enzymes: No results for input(s): CKTOTAL, CKMB, CKMBINDEX, TROPONINI in the last 168 hours. BNP (last 3 results) No results for input(s): PROBNP in the last 8760 hours. HbA1C: No results for input(s): HGBA1C in the last 72 hours. CBG: No results for input(s): GLUCAP in the last 168 hours. Lipid Profile: No results for input(s): CHOL, HDL, LDLCALC, TRIG, CHOLHDL, LDLDIRECT in the last 72 hours. Thyroid Function Tests: No results for input(s): TSH, T4TOTAL, FREET4, T3FREE, THYROIDAB in the last 72 hours. Anemia Panel: No results for input(s): VITAMINB12, FOLATE, FERRITIN, TIBC, IRON, RETICCTPCT in the last 72 hours. Urine analysis:    Component Value Date/Time   COLORURINE YELLOW 03/12/2017 0831   APPEARANCEUR CLEAR 03/12/2017 0831   LABSPEC 1.016 03/12/2017 0831   PHURINE 6.0 03/12/2017 0831   GLUCOSEU NEGATIVE 03/12/2017 0831   HGBUR NEGATIVE 03/12/2017 0831   BILIRUBINUR NEGATIVE 03/12/2017 0831   KETONESUR 5 (A) 03/12/2017 0831   PROTEINUR NEGATIVE 03/12/2017 0831   UROBILINOGEN 0.2 12/06/2009 0207   NITRITE NEGATIVE 03/12/2017 0831   LEUKOCYTESUR TRACE (A) 03/12/2017 0831     Radiological Exams on Admission: Ct Abdomen Pelvis Wo Contrast  Result  Date: 03/12/2017 CLINICAL DATA:  Vomiting abdominal pain EXAM: CT ABDOMEN AND PELVIS WITHOUT CONTRAST TECHNIQUE: Multidetector CT imaging of the abdomen and pelvis was performed following the standard protocol without IV contrast. COMPARISON:  09/11/2011 FINDINGS: Lower chest: No acute abnormality. Hepatobiliary: Small liver cysts are again identified. Previous cholecystectomy. Pancreas: Unremarkable. No pancreatic ductal dilatation or surrounding inflammatory changes. Spleen: Normal in size without focal abnormality. Adrenals/Urinary Tract: Adrenal glands are unremarkable. Small renal cysts are identified on the left. No mass or hydronephrosis identified. The urinary bladder appears within normal limits. Stomach/Bowel: There is a small hiatal hernia. Distension of the gastric lumen is identified. There are multiple abnormally dilated loops of small bowel with air-fluid levels. The small bowel loops measure up to 3.6 cm. No pathologic dilatation of the colon. Decreased caliber distal small bowel loops noted within the pelvis. Due to lack of sufficient enteric contrast material within mid and distal small bowel loops the transition point is difficult to identified but is likely at the level of the proximal or mid ileum. Vascular/Lymphatic: Aortic atherosclerosis. No aneurysm. No upper abdominal or pelvic adenopathy. A pelvic lymph node dissection has been performed. No inguinal adenopathy. Reproductive: Previous hysterectomy.  No adnexal mass. Other: There is no ascites or focal fluid collections within the abdomen or pelvis. Musculoskeletal: No aggressive lytic or aggressive sclerotic bone lesions identified. Scoliosis deformity involving the mid lumbar spine is convex towards the left. There is associated multi level degenerative disc disease identified. IMPRESSION: 1. Imaging features compatible with small bowel obstruction. Transition point is likely at the level of the mid or distal ileum. 2. Aortic  atherosclerosis. 3. Previous hysterectomy and pelvic lymph node dissection. Electronically Signed   By: Kerby Moors M.D.   On: 03/12/2017 09:26    EKG: Independently reviewed.  Telemetry shows sinus  Assessment/Plan Active Problems:   Chronic systolic CHF (congestive heart failure) (HCC)   Hypercholesteremia   CAD (coronary artery disease)   OSA (obstructive sleep apnea)   SBO (small bowel obstruction)   CKD (chronic kidney disease), stage III  HTN (hypertension)   Small bowel obstruction -CT scan positive for small bowel obstruction with transition point in the mid or distal ileum.  General surgery consulted, appreciate input.  Her now conservative management, she already had an NG tube placed   Chronic systolic CHF -Most recent 2D echo was done in 2013, EF was 45%.  There is no evidence of fluid overload.  With her current GI losses, I placed patient on IV fluids for 24 hours, reassess fluid status and small bowel obstruction status tomorrow morning and determine whether additional fluids are needed  Hypertension -She is strict n.p.o., IV hydralazine as needed  Chronic kidney disease stage III -Baseline creatinine is around 1.4-1.5, it is 1.67 this admission which is close to baseline  Coronary artery disease -Reports no chest pain or anginal type symptoms  Hyperlipidemia -Statin on hold due to n.p.o.  Obstructive sleep apnea -Nightly CPAP   DVT prophylaxis: Heparin Code Status: Full code Family Communication: Discussed with daughter at bedside Disposition Plan: Admit to MedSurg Consults called: General surgery    Admission status: Observation  At the point of initial evaluation, it is my clinical opinion that admission for OBSERVATION is reasonable and necessary because the patient's presenting complaints in the context of their chronic conditions represent sufficient risk of deterioration or significant morbidity to constitute reasonable grounds for close  observation in the hospital setting, but that the patient may be medically stable for discharge from the hospital within 24 to 48 hours.   Marzetta Board, MD [facility] Pager [phone]  If 7PM-7AM, please contact night-coverage [email or web address] Password [facility]  03/12/2017, 10:38 AM

## 2017-03-12 NOTE — Consult Note (Signed)
[facility] Consult Note  Paige Cook 1928-06-28  [id number].    Requesting MD: Paige Cook Chief Complaint/Reason for Consult: SBO  HPI:  Paige Cook is an 83yo female with h/o multiple abdominal surgeries, who presented to [facility] earlier today with 1 day of abdominal pain, nausea, and vomiting. Pain is in her lower abdomen. It was sharp and crampy, constant and gradually getting worse. States that she had never had pain like this before. Denies fever, chills, diarrhea, or dysuria.   Hospital workup: - CT scan shows SBO with transition point likely at the level of the mid or distal ileum - lipase and LFT's WNL - WBC 10.7, afebrile - Cr 1.67  Patient states that since arriving at the hospital her pain has resolved. She had a BM in the ED and passed flatus.   PMH significant for Chronic systolic CHF, HTN, CKD III, CAD, HLD, OSA Abdominal surgical history: hysterectomy, cholecystectomy, sigmoid colectomy for colon cancer in 1989, and bladder surgery for bladder cancer 2013 Anticoagulants: None Lives home alone with her husband  ROS: Review of Systems  Constitutional: Negative.   HENT: Negative.   Eyes: Negative.   Respiratory: Negative.   Cardiovascular: Negative.   Gastrointestinal: Positive for abdominal pain, nausea and vomiting. Negative for blood in stool, constipation, diarrhea, heartburn and melena.  Genitourinary: Negative.   Musculoskeletal: Positive for back pain.  Skin: Negative.   Neurological: Negative.   All systems reviewed and otherwise negative except for as above  Family History  Problem Relation Age of Onset  . Heart disease Father   . Heart attack Father   . Heart attack Mother   . Heart failure Sister 22  . Stroke Neg Hx     Past Medical History:  Diagnosis Date  . Anemia   . Arthritis   . Bladder cancer ([place])    giant tumor  . CAD (coronary artery disease)   . Cardiac arrhythmia    benign  . Carotid bruit    left  . CHF  (congestive heart failure) (HCC)    CHF with systolic dysfuntion ; dilated nonishemic cardiomyopathy  . Colon cancer ([place]) 1989  . Colon polyps   . Diverticulosis   . Epistaxis   . Female bladder prolapse   . Gallstones   . Hypercholesteremia   . Hyponatremia    Probably secondary to her CHF  . OSA (obstructive sleep apnea) 04/29/2012  . SOB (shortness of breath)    Sometimes at night    Past Surgical History:  Procedure Laterality Date  . ABDOMINAL HYSTERECTOMY    . BLADDER SURGERY  2013   cancer removed  . bladder tact    . CARDIAC CATHETERIZATION     non obstruction CAD  . CHOLECYSTECTOMY    . 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 COLONOSCOPY      Social History:  reports that she has never smoked. She has never used smokeless tobacco. She reports that she does not drink alcohol or use drugs.  Allergies:  Allergies  Allergen Reactions  . Penicillins Hives    Has patient had a PCN reaction causing immediate rash, facial/tongue/throat swelling, SOB or lightheadedness with hypotension: Yes Has patient had a PCN reaction causing severe rash involving mucus membranes or skin necrosis: Yes Has patient had a PCN reaction that required hospitalization No Has patient had a PCN reaction occurring within the last 10 years: No If all of the above answers are "NO", then may proceed  with Cephalosporin use.   . Tramadol Nausea Only and Other (See Comments)    shakes    Medications Prior to Admission  Medication Sig Dispense Refill  . aspirin 81 MG chewable tablet Chew 81 mg by mouth daily with breakfast.    . calcium-vitamin D (OSCAL WITH D) 500-200 MG-UNIT per tablet Take 1 tablet by mouth every evening.     . carvedilol (COREG) 6.25 MG tablet TAKE 1 TABLET BY MOUTH TWICE A DAY WITH A MEAL. (Patient taking differently: TAKE 1 TABLET BY MOUTH TWICE A DAY WITH A MEAL) 180 tablet 2  . furosemide (LASIX) 40 MG tablet TAKE 1 TABLET (40 MG TOTAL) BY MOUTH DAILY. (Patient taking  differently: TAKE 20 mg BY MOUTH DAILY.) 90 tablet 2  . glucosamine-chondroitin 500-400 MG tablet Take 1 tablet by mouth daily with breakfast.     . ramipril (ALTACE) 5 MG capsule TAKE 1 CAPSULE (5 MG TOTAL) BY MOUTH DAILY. (Patient taking differently: TAKE 1 CAPSULE (5 MG TOTAL) BY MOUTH every evening) 90 capsule 2  . vitamin C (ASCORBIC ACID) 250 MG tablet Take 250 mg by mouth daily with breakfast.    . atorvastatin (LIPITOR) 20 MG tablet Take 1 tablet (20 mg total) by mouth daily. (Patient not taking: Reported on 03/12/2017) 30 tablet 6    Prior to Admission medications   Medication Sig Start Date End Date Taking? Authorizing Provider  aspirin 81 MG chewable tablet Chew 81 mg by mouth daily with breakfast.   Yes Historical Provider, MD  calcium-vitamin D (OSCAL WITH D) 500-200 MG-UNIT per tablet Take 1 tablet by mouth every evening.    Yes Historical Provider, MD  carvedilol (COREG) 6.25 MG tablet TAKE 1 TABLET BY MOUTH TWICE A DAY WITH A MEAL. Patient taking differently: TAKE 1 TABLET BY MOUTH TWICE A DAY WITH A MEAL 11/15/16  Yes Peter M Martinique, MD  furosemide (LASIX) 40 MG tablet TAKE 1 TABLET (40 MG TOTAL) BY MOUTH DAILY. Patient taking differently: TAKE 20 mg BY MOUTH DAILY. 11/15/16  Yes Peter M Martinique, MD  glucosamine-chondroitin 500-400 MG tablet Take 1 tablet by mouth daily with breakfast.    Yes Historical Provider, MD  ramipril (ALTACE) 5 MG capsule TAKE 1 CAPSULE (5 MG TOTAL) BY MOUTH DAILY. Patient taking differently: TAKE 1 CAPSULE (5 MG TOTAL) BY MOUTH every evening 02/20/17  Yes Peter M Martinique, MD  vitamin C (ASCORBIC ACID) 250 MG tablet Take 250 mg by mouth daily with breakfast.   Yes Historical Provider, MD  atorvastatin (LIPITOR) 20 MG tablet Take 1 tablet (20 mg total) by mouth daily. Patient not taking: Reported on 03/12/2017 04/29/15   Peter M Martinique, MD    Blood pressure (!) 154/86, pulse 96, temperature 97.4 F (36.3 C), temperature source Oral, resp. rate 16, height 5'  5" (1.651 m), weight 113 lb (51.3 kg), SpO2 94 %. Physical Exam: General: pleasant, WD/WN white female who is laying in bed in NAD HEENT: head is normocephalic, atraumatic.  Sclera are noninjected.  PERRL.  Ears and nose without any masses or lesions.  Mouth is pink and moist Heart: regular, rate, and rhythm.  No obvious murmurs, gallops, or rubs noted.  Palpable pedal pulses bilaterally Lungs: CTAB, no wheezes, rhonchi, or rales noted.  Respiratory effort nonlabored Abd: well healed midline/RUQ/RLQ incisions, soft, NT/ND, +BS, no masses, hernias, or organomegaly MS: all 4 extremities are symmetrical with no cyanosis, clubbing, or edema. Skin: warm and dry with no masses, lesions, or rashes Psych:  A&Ox3 with an appropriate affect. Neuro: CM 2-12 intact, extremity CSM intact bilaterally, normal speech  Results for orders placed or performed during the hospital encounter of 03/12/17 (from the past 48 hour(s))  Lipase, blood     Status: None   Collection Time: 03/12/17  6:23 AM  Result Value Ref Range   Lipase 29 11 - 51 U/L  Comprehensive metabolic panel     Status: Abnormal   Collection Time: 03/12/17  6:23 AM  Result Value Ref Range   Sodium 141 135 - 145 mmol/L   Potassium 4.4 3.5 - 5.1 mmol/L   Chloride 97 (L) 101 - 111 mmol/L   CO2 31 22 - 32 mmol/L   Glucose, Bld 158 (H) 65 - 99 mg/dL   BUN 63 (H) 6 - 20 mg/dL   Creatinine, Ser 1.67 (H) 0.44 - 1.00 mg/dL   Calcium 10.0 8.9 - 10.3 mg/dL   Total Protein 8.0 6.5 - 8.1 g/dL   Albumin 4.6 3.5 - 5.0 g/dL   AST 26 15 - 41 U/L   ALT 16 14 - 54 U/L   Alkaline Phosphatase 87 38 - 126 U/L   Total Bilirubin 0.9 0.3 - 1.2 mg/dL   GFR calc non Af Amer 27 (L) >60 mL/min   GFR calc Af Amer 31 (L) >60 mL/min    Comment: (NOTE) The eGFR has been calculated using the CKD EPI equation. This calculation has not been validated in all clinical situations. eGFR's persistently <60 mL/min signify possible Chronic Kidney Disease.    Anion gap 13 5  - 15  CBC     Status: Abnormal   Collection Time: 03/12/17  6:23 AM  Result Value Ref Range   WBC 10.7 (H) 4.0 - 10.5 K/uL   RBC 4.15 3.87 - 5.11 MIL/uL   Hemoglobin 13.1 12.0 - 15.0 g/dL   HCT 38.3 36.0 - 46.0 %   MCV 92.3 78.0 - 100.0 fL   MCH 31.6 26.0 - 34.0 pg   MCHC 34.2 30.0 - 36.0 g/dL   RDW 13.1 11.5 - 15.5 %   Platelets 206 150 - 400 K/uL  Urinalysis, Routine w reflex microscopic     Status: Abnormal   Collection Time: 03/12/17  8:31 AM  Result Value Ref Range   Color, Urine YELLOW YELLOW   APPearance CLEAR CLEAR   Specific Gravity, Urine 1.016 1.005 - 1.030   pH 6.0 5.0 - 8.0   Glucose, UA NEGATIVE NEGATIVE mg/dL   Hgb urine dipstick NEGATIVE NEGATIVE   Bilirubin Urine NEGATIVE NEGATIVE   Ketones, ur 5 (A) NEGATIVE mg/dL   Protein, ur NEGATIVE NEGATIVE mg/dL   Nitrite NEGATIVE NEGATIVE   Leukocytes, UA TRACE (A) NEGATIVE   RBC / HPF 0-5 0 - 5 RBC/hpf   WBC, UA 6-30 0 - 5 WBC/hpf   Bacteria, UA NONE SEEN NONE SEEN   Squamous Epithelial / LPF 0-5 (A) NONE SEEN   Mucous PRESENT    Hyaline Casts, UA PRESENT    Ct Abdomen Pelvis Wo Contrast  Result Date: 03/12/2017 CLINICAL DATA:  Vomiting abdominal pain EXAM: CT ABDOMEN AND PELVIS WITHOUT CONTRAST TECHNIQUE: Multidetector CT imaging of the abdomen and pelvis was performed following the standard protocol without IV contrast. COMPARISON:  09/11/2011 FINDINGS: Lower chest: No acute abnormality. Hepatobiliary: Small liver cysts are again identified. Previous cholecystectomy. Pancreas: Unremarkable. No pancreatic ductal dilatation or surrounding inflammatory changes. Spleen: Normal in size without focal abnormality. Adrenals/Urinary Tract: Adrenal glands are unremarkable. Small renal cysts  are identified on the left. No mass or hydronephrosis identified. The urinary bladder appears within normal limits. Stomach/Bowel: There is a small hiatal hernia. Distension of the gastric lumen is identified. There are multiple abnormally  dilated loops of small bowel with air-fluid levels. The small bowel loops measure up to 3.6 cm. No pathologic dilatation of the colon. Decreased caliber distal small bowel loops noted within the pelvis. Due to lack of sufficient enteric contrast material within mid and distal small bowel loops the transition point is difficult to identified but is likely at the level of the proximal or mid ileum. Vascular/Lymphatic: Aortic atherosclerosis. No aneurysm. No upper abdominal or pelvic adenopathy. A pelvic lymph node dissection has been performed. No inguinal adenopathy. Reproductive: Previous hysterectomy.  No adnexal mass. Other: There is no ascites or focal fluid collections within the abdomen or pelvis. Musculoskeletal: No aggressive lytic or aggressive sclerotic bone lesions identified. Scoliosis deformity involving the mid lumbar spine is convex towards the left. There is associated multi level degenerative disc disease identified. IMPRESSION: 1. Imaging features compatible with small bowel obstruction. Transition point is likely at the level of the mid or distal ileum. 2. Aortic atherosclerosis. 3. Previous hysterectomy and pelvic lymph node dissection. Electronically Signed   By: Kerby Moors M.D.   On: 03/12/2017 09:26   Dg Abd Portable 1 View  Result Date: 03/12/2017 CLINICAL DATA:  Abdominal pain.  Nausea. EXAM: PORTABLE ABDOMEN - 1 VIEW COMPARISON:  03/12/2017 . FINDINGS: NG tube noted with tip projected over the upper most portion of the stomach. Advancement of approximately 8 cm should be considered. No bowel distention. No free air. Thoracolumbar spine scoliosis. Diffuse degenerative change. No acute bony abnormality. Aortoiliac atherosclerotic vascular calcification. Surgical sutures and clips are noted over the abdomen and pelvis. IMPRESSION: 1.NG tube noted with its tip projected over the upper most portion stomach. Advancement of approximately 8 cm should be considered. No bowel distention. 2.  Aortoiliac atherosclerotic vascular disease. Electronically Signed   By: Marcello Moores  Register   On: 03/12/2017 10:49      Assessment/Plan SBO  - abdominal surgical history includes hysterectomy, cholecystectomy, sigmoid colectomy for colon cancer in 1989, and bladder surgery for bladder cancer 2013 - 1 day of lower abdominal pain, nausea, and vomiting - CT scan shows SBO with transition point likely at the level of the mid or distal ileum  Chronic systolic CHF - EF 08% in 1448 HTN CKD III CAD HLD OSA H/o colon cancer s/p sigmoid colectomy H/o bladder cancer s/p chemotherapy and surgery   ID - none VTE - heparin FEN - IVF, NPO/NGT  Plan - SBO seems to be improving as Ms. Hopf passed flatus and had BM in the ED. Continue NG tube and bowel rest. Encourage ambulation. If she continues to have bowel function will plan clamping trial in AM. If not, will likely start on small bowel protocol.  Jerrye Beavers, [facility] Surgery 03/12/2017, 11:55 AM Pager: [phone] Consults: [phone] Mon-Fri 7:00 am-4:30 pm Sat-Sun 7:00 am-11:30 am

## 2017-03-12 NOTE — ED Notes (Signed)
Bed: BV67 Expected date:  Expected time:  Means of arrival:  Comments: 83 yo F/ Abd pain-N/V

## 2017-03-12 NOTE — ED Notes (Signed)
ED Provider at bedside. 

## 2017-03-12 NOTE — ED Provider Notes (Signed)
[facility] DEPT Provider Note   CSN: [id number] Arrival date & time: 03/12/17  0559     History   Chief Complaint Chief Complaint  Patient presents with  . Abdominal Pain  . Emesis    HPI Paige Cook is a 83 y.o. female.  HPI   Patient is a 83 year old female with history of CHF, colon cancer status post surgery, CAD, bladder cancer status post chemotherapy and surgery who presents to the ED with complaint of abdominal pain, onset yesterday afternoon. Patient reports having constant sharp cramping pain to her lower abdomen that started yesterday afternoon while she was sitting resting at home. She reports the pain gradually worsened throughout the night and states she woke up around 2 AM with worsening abdominal pain, nausea and NBNB vomiting. Patient denies taking any medications for her symptoms PTA but notes her nausea and pain significantly improved after receiving Zofran via EMS. Denies fever, chills, chest pain, shortness of breath, hematemesis, diarrhea, urinary symptoms, blood in urine or stool, vaginal bleeding, flank pain. Denies history of similar episodes in the past. Denies any recent hospitalizations. Denies any known sick contacts. Endorses abdominal surgical history of cholecystectomy and hysterectomy. Denies use of anticoagulants.  Past Medical History:  Diagnosis Date  . Anemia   . Arthritis   . Bladder cancer ([place])    giant tumor  . CAD (coronary artery disease)   . Cardiac arrhythmia    benign  . Carotid bruit    left  . CHF (congestive heart failure) (HCC)    CHF with systolic dysfuntion ; dilated nonishemic cardiomyopathy  . Colon cancer ([place]) 1989  . Colon polyps   . Diverticulosis   . Epistaxis   . Female bladder prolapse   . Gallstones   . Hypercholesteremia   . Hyponatremia    Probably secondary to her CHF  . OSA (obstructive sleep apnea) 04/29/2012  . SOB (shortness of breath)    Sometimes at night    Patient Active Problem List   Diagnosis Date Noted  . SBO (small bowel obstruction) 03/12/2017  . CKD (chronic kidney disease), stage III 03/12/2017  . HTN (hypertension) 03/12/2017  . PAC (premature atrial contraction) 08/14/2012  . PVC's (premature ventricular contractions) 08/14/2012  . Heart palpitations 07/25/2012  . OSA (obstructive sleep apnea) 04/29/2012  . Chronic systolic CHF (congestive heart failure) ([place])   . Carotid bruit   . Hypercholesteremia   . CAD (coronary artery disease)     Past Surgical History:  Procedure Laterality Date  . ABDOMINAL HYSTERECTOMY    . BLADDER SURGERY  2013   cancer removed  . bladder tact    . CARDIAC CATHETERIZATION     non obstruction CAD  . CHOLECYSTECTOMY    . 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 COLONOSCOPY      OB History    No data available       Home Medications    Prior to Admission medications   Medication Sig Start Date End Date Taking? Authorizing Provider  aspirin 81 MG chewable tablet Chew 81 mg by mouth daily with breakfast.   Yes Historical Provider, MD  calcium-vitamin D (OSCAL WITH D) 500-200 MG-UNIT per tablet Take 1 tablet by mouth every evening.    Yes Historical Provider, MD  carvedilol (COREG) 6.25 MG tablet TAKE 1 TABLET BY MOUTH TWICE A DAY WITH A MEAL. Patient taking differently: TAKE 1 TABLET BY MOUTH TWICE A DAY WITH A MEAL 11/15/16  Yes Collier Salina  M Martinique, MD  furosemide (LASIX) 40 MG tablet TAKE 1 TABLET (40 MG TOTAL) BY MOUTH DAILY. Patient taking differently: TAKE 20 mg BY MOUTH DAILY. 11/15/16  Yes Peter M Martinique, MD  glucosamine-chondroitin 500-400 MG tablet Take 1 tablet by mouth daily with breakfast.    Yes Historical Provider, MD  ramipril (ALTACE) 5 MG capsule TAKE 1 CAPSULE (5 MG TOTAL) BY MOUTH DAILY. Patient taking differently: TAKE 1 CAPSULE (5 MG TOTAL) BY MOUTH every evening 02/20/17  Yes Peter M Martinique, MD  vitamin C (ASCORBIC ACID) 250 MG tablet Take 250 mg by mouth daily with breakfast.   Yes Historical Provider, MD    atorvastatin (LIPITOR) 20 MG tablet Take 1 tablet (20 mg total) by mouth daily. Patient not taking: Reported on 03/12/2017 04/29/15   Peter M Martinique, MD    Family History Family History  Problem Relation Age of Onset  . Heart disease Father   . Heart attack Father   . Heart attack Mother   . Heart failure Sister 75  . Stroke Neg Hx     Social History Social History  Substance Use Topics  . Smoking status: Never Smoker  . Smokeless tobacco: Never Used  . Alcohol use No     Allergies   Penicillins and Tramadol   Review of Systems Review of Systems  Gastrointestinal: Positive for abdominal pain, nausea and vomiting.  All other systems reviewed and are negative.    Physical Exam Updated Vital Signs BP (!) 149/67   Pulse 91   Temp 97.4 F (36.3 C) (Oral)   Resp 16   Ht 5\' 5"  (1.651 m)   Wt 51.3 kg   SpO2 92%   BMI 18.80 kg/m   Physical Exam  Constitutional: She is oriented to person, place, and time. She appears well-developed and well-nourished. No distress.  HENT:  Head: Normocephalic and atraumatic.  Mouth/Throat: Uvula is midline, oropharynx is clear and moist and mucous membranes are normal. No oropharyngeal exudate, posterior oropharyngeal edema, posterior oropharyngeal erythema or tonsillar abscesses. No tonsillar exudate.  Eyes: Conjunctivae and EOM are normal. Right eye exhibits no discharge. Left eye exhibits no discharge. No scleral icterus.  Neck: Normal range of motion. Neck supple.  Cardiovascular: Normal rate, regular rhythm, normal heart sounds and intact distal pulses.   Pulmonary/Chest: Effort normal and breath sounds normal. No respiratory distress. She has no wheezes. She has no rales. She exhibits no tenderness.  Abdominal: Soft. Bowel sounds are normal. She exhibits no distension and no mass. There is tenderness. There is no rigidity, no rebound, no guarding, no CVA tenderness and negative Murphy's sign. No hernia.  Mild diffuse tenderness,  worse over suprapubic region.  Musculoskeletal: She exhibits no edema.  Neurological: She is alert and oriented to person, place, and time.  Skin: Skin is warm and dry. She is not diaphoretic.  Nursing note and vitals reviewed.    ED Treatments / Results  Labs (all labs ordered are listed, but only abnormal results are displayed) Labs Reviewed  COMPREHENSIVE METABOLIC PANEL - Abnormal; Notable for the following:       Result Value   Chloride 97 (*)    Glucose, Bld 158 (*)    BUN 63 (*)    Creatinine, Ser 1.67 (*)    GFR calc non Af Amer 27 (*)    GFR calc Af Amer 31 (*)    All other components within normal limits  CBC - Abnormal; Notable for the following:  WBC 10.7 (*)    All other components within normal limits  URINALYSIS, ROUTINE W REFLEX MICROSCOPIC - Abnormal; Notable for the following:    Ketones, ur 5 (*)    Leukocytes, UA TRACE (*)    Squamous Epithelial / LPF 0-5 (*)    All other components within normal limits  LIPASE, BLOOD    EKG  EKG Interpretation None       Radiology Ct Abdomen Pelvis Wo Contrast  Result Date: 03/12/2017 CLINICAL DATA:  Vomiting abdominal pain EXAM: CT ABDOMEN AND PELVIS WITHOUT CONTRAST TECHNIQUE: Multidetector CT imaging of the abdomen and pelvis was performed following the standard protocol without IV contrast. COMPARISON:  09/11/2011 FINDINGS: Lower chest: No acute abnormality. Hepatobiliary: Small liver cysts are again identified. Previous cholecystectomy. Pancreas: Unremarkable. No pancreatic ductal dilatation or surrounding inflammatory changes. Spleen: Normal in size without focal abnormality. Adrenals/Urinary Tract: Adrenal glands are unremarkable. Small renal cysts are identified on the left. No mass or hydronephrosis identified. The urinary bladder appears within normal limits. Stomach/Bowel: There is a small hiatal hernia. Distension of the gastric lumen is identified. There are multiple abnormally dilated loops of small bowel  with air-fluid levels. The small bowel loops measure up to 3.6 cm. No pathologic dilatation of the colon. Decreased caliber distal small bowel loops noted within the pelvis. Due to lack of sufficient enteric contrast material within mid and distal small bowel loops the transition point is difficult to identified but is likely at the level of the proximal or mid ileum. Vascular/Lymphatic: Aortic atherosclerosis. No aneurysm. No upper abdominal or pelvic adenopathy. A pelvic lymph node dissection has been performed. No inguinal adenopathy. Reproductive: Previous hysterectomy.  No adnexal mass. Other: There is no ascites or focal fluid collections within the abdomen or pelvis. Musculoskeletal: No aggressive lytic or aggressive sclerotic bone lesions identified. Scoliosis deformity involving the mid lumbar spine is convex towards the left. There is associated multi level degenerative disc disease identified. IMPRESSION: 1. Imaging features compatible with small bowel obstruction. Transition point is likely at the level of the mid or distal ileum. 2. Aortic atherosclerosis. 3. Previous hysterectomy and pelvic lymph node dissection. Electronically Signed   By: Kerby Moors M.D.   On: 03/12/2017 09:26    Procedures Procedures (including critical care time)  Medications Ordered in ED Medications  fentaNYL (SUBLIMAZE) injection 50 mcg (not administered)  sodium chloride 0.9 % bolus 1,000 mL (0 mLs Intravenous Stopped 03/12/17 0919)  iopamidol (ISOVUE-300) 61 % injection 30 mL (30 mLs Oral Contrast Given 03/12/17 0712)  ondansetron (ZOFRAN) injection 4 mg (4 mg Intravenous Given 03/12/17 0916)  lidocaine (XYLOCAINE) 2 % jelly 1 application (1 application Other Given by Other 03/12/17 1030)     Initial Impression / Assessment and Plan / ED Course  I have reviewed the triage vital signs and the nursing notes.  Pertinent labs & imaging results that were available during my care of the patient were reviewed by me  and considered in my medical decision making (see chart for details).     Patient presents with abdominal pain with associated nausea and vomiting that started last night. Denies fever. History of multiple abdominal surgeries including resections for colon and bladder cancer, cholecystectomy and hysterectomy. VSS. Exam revealed mild diffuse tenderness, slightly worse over suprapubic region, no peritoneal signs. Remaining exam unremarkable. Patient given IV fluids and antiemetics in the ED, declined pain meds. Labs revealed WBC 10.7. Cr 1.67. CT abdomen w/o ordered. CT showed SBO. Discussed pt with Dr. Rex Kras  who also evaluated pt in the ED. NG tube placed. Consulted general surgery who reports they will come see the pt, advised to admit to medicine. Consulted hospitalist.   Final Clinical Impressions(s) / ED Diagnoses   Final diagnoses:  SBO (small bowel obstruction)    New Prescriptions New Prescriptions   No medications on file     Nona Dell, PA-C 03/12/17 [place], MD 03/12/17 [phone]

## 2017-03-12 NOTE — ED Notes (Signed)
NG tube moved up approx 8 cm.

## 2017-03-12 NOTE — ED Triage Notes (Signed)
PT presents by EMS from home for evaluation of abd pain along with nausea and vomiting that started yesterday evening. EMS reported pt having appx 332ml emesis in trash can at home. EMS administered 4mg  Zofran IVP during transport and pt reports improvement of symptoms.

## 2017-03-12 NOTE — Progress Notes (Signed)
Inquired with pt about her CPAP.  Pt said she hasn't used her CPAP in several years.and did not want to use it tonight. RT will continue to monitor.

## 2017-03-12 NOTE — ED Notes (Signed)
Pt is aware that a urine sample is needed but is unable to give one at this time.

## 2017-03-12 NOTE — ED Notes (Signed)
Hospitalist at bedside 

## 2017-03-13 DIAGNOSIS — K56600 Partial intestinal obstruction, unspecified as to cause: Secondary | ICD-10-CM | POA: Diagnosis not present

## 2017-03-13 DIAGNOSIS — Z885 Allergy status to narcotic agent status: Secondary | ICD-10-CM | POA: Diagnosis not present

## 2017-03-13 DIAGNOSIS — Z880 Allergy status to penicillin: Secondary | ICD-10-CM | POA: Diagnosis not present

## 2017-03-13 DIAGNOSIS — D72829 Elevated white blood cell count, unspecified: Secondary | ICD-10-CM | POA: Diagnosis present

## 2017-03-13 DIAGNOSIS — Z9989 Dependence on other enabling machines and devices: Secondary | ICD-10-CM | POA: Diagnosis not present

## 2017-03-13 DIAGNOSIS — Z9049 Acquired absence of other specified parts of digestive tract: Secondary | ICD-10-CM | POA: Diagnosis not present

## 2017-03-13 DIAGNOSIS — R1030 Lower abdominal pain, unspecified: Secondary | ICD-10-CM | POA: Diagnosis present

## 2017-03-13 DIAGNOSIS — Z7982 Long term (current) use of aspirin: Secondary | ICD-10-CM | POA: Diagnosis not present

## 2017-03-13 DIAGNOSIS — I420 Dilated cardiomyopathy: Secondary | ICD-10-CM | POA: Diagnosis present

## 2017-03-13 DIAGNOSIS — Z85038 Personal history of other malignant neoplasm of large intestine: Secondary | ICD-10-CM | POA: Diagnosis not present

## 2017-03-13 DIAGNOSIS — N183 Chronic kidney disease, stage 3 (moderate): Secondary | ICD-10-CM | POA: Diagnosis present

## 2017-03-13 DIAGNOSIS — I130 Hypertensive heart and chronic kidney disease with heart failure and stage 1 through stage 4 chronic kidney disease, or unspecified chronic kidney disease: Secondary | ICD-10-CM | POA: Diagnosis present

## 2017-03-13 DIAGNOSIS — Z9221 Personal history of antineoplastic chemotherapy: Secondary | ICD-10-CM | POA: Diagnosis not present

## 2017-03-13 DIAGNOSIS — K56609 Unspecified intestinal obstruction, unspecified as to partial versus complete obstruction: Secondary | ICD-10-CM | POA: Diagnosis not present

## 2017-03-13 DIAGNOSIS — M1990 Unspecified osteoarthritis, unspecified site: Secondary | ICD-10-CM | POA: Diagnosis present

## 2017-03-13 DIAGNOSIS — G4733 Obstructive sleep apnea (adult) (pediatric): Secondary | ICD-10-CM | POA: Diagnosis present

## 2017-03-13 DIAGNOSIS — M419 Scoliosis, unspecified: Secondary | ICD-10-CM | POA: Diagnosis present

## 2017-03-13 DIAGNOSIS — Z79899 Other long term (current) drug therapy: Secondary | ICD-10-CM | POA: Diagnosis not present

## 2017-03-13 DIAGNOSIS — I5022 Chronic systolic (congestive) heart failure: Secondary | ICD-10-CM | POA: Diagnosis present

## 2017-03-13 DIAGNOSIS — I2510 Atherosclerotic heart disease of native coronary artery without angina pectoris: Secondary | ICD-10-CM | POA: Diagnosis present

## 2017-03-13 DIAGNOSIS — E785 Hyperlipidemia, unspecified: Secondary | ICD-10-CM | POA: Diagnosis present

## 2017-03-13 LAB — COMPREHENSIVE METABOLIC PANEL
ALBUMIN: 3.1 g/dL — AB (ref 3.5–5.0)
ALK PHOS: 58 U/L (ref 38–126)
ALT: 12 U/L — AB (ref 14–54)
AST: 18 U/L (ref 15–41)
Anion gap: 14 (ref 5–15)
BUN: 50 mg/dL — AB (ref 6–20)
CALCIUM: 8.6 mg/dL — AB (ref 8.9–10.3)
CHLORIDE: 104 mmol/L (ref 101–111)
CO2: 25 mmol/L (ref 22–32)
CREATININE: 1.46 mg/dL — AB (ref 0.44–1.00)
GFR calc Af Amer: 36 mL/min — ABNORMAL LOW (ref >60)
GFR calc non Af Amer: 31 mL/min — ABNORMAL LOW (ref >60)
GLUCOSE: 78 mg/dL (ref 65–99)
Potassium: 4.2 mmol/L (ref 3.5–5.1)
SODIUM: 143 mmol/L (ref 135–145)
Total Bilirubin: 0.9 mg/dL (ref 0.3–1.2)
Total Protein: 5.6 g/dL — ABNORMAL LOW (ref 6.5–8.1)

## 2017-03-13 LAB — CBC
HCT: 30.9 % — ABNORMAL LOW (ref 36.0–46.0)
Hemoglobin: 10.6 g/dL — ABNORMAL LOW (ref 12.0–15.0)
MCH: 32.9 pg (ref 26.0–34.0)
MCHC: 34.3 g/dL (ref 30.0–36.0)
MCV: 96 fL (ref 78.0–100.0)
Platelets: 171 10*3/uL (ref 150–400)
RBC: 3.22 MIL/uL — AB (ref 3.87–5.11)
RDW: 13.4 % (ref 11.5–15.5)
WBC: 5.9 10*3/uL (ref 4.0–10.5)

## 2017-03-13 MED ORDER — ONDANSETRON HCL 4 MG/2ML IJ SOLN: 4.0000 mg | Freq: Four times a day (QID) | INTRAMUSCULAR | Status: DC | PRN

## 2017-03-13 MED ORDER — POLYETHYLENE GLYCOL 3350 17 G PO PACK: 17.0000 g | PACK | Freq: Every day | ORAL | Status: DC | PRN

## 2017-03-13 NOTE — Progress Notes (Signed)
Subjective: She had a BM yesterday, not today, feels better.  Abdomen is soft nontender with a few bowel sounds. She's been seen by Dr. Barry Dienes.  Objective: Vital signs in last 24 hours: Temp:  [98.7 F (37.1 C)-99.8 F (37.7 C)] 98.9 F (37.2 C) (03/28 0501) Pulse Rate:  [78-90] 84 (03/28 0501) Resp:  [18-22] 18 (03/28 0501) BP: (136-149)/(41-65) 149/65 (03/28 0501) SpO2:  [94 %-95 %] 95 % (03/28 0501) Last BM Date: 03/12/17 NPO 1000 IV Urine 500 NG 500 Other 500 Afebrile, VSS Creatinine is better 1.46, WBC is nromal NO film today Film yesterday at 10:49 AM:  No bowel distention. No free air. Thoracolumbar spine scoliosis. Diffuse degenerative change. No acute bony abnormality. Aortoiliac atherosclerotic vascular calcification. Surgical sutures and clips are noted over the abdomen and pelvis. Intake/Output from previous day: 03/27 0701 - 03/28 0700 In: 1675 [I.V.:675; IV Piggyback:1000] Out: 1500 [Urine:500; Emesis/NG output:500] Intake/Output this shift: Total I/O In: -  Out: 200 [Urine:200]  General appearance: alert, cooperative and no distress GI: Soft, nontender, not distended. Few bowel sounds and flatus. Bowel movement yesterday.  Lab Results:   Recent Labs  03/12/17 0623 03/13/17 0439  WBC 10.7* 5.9  HGB 13.1 10.6*  HCT 38.3 30.9*  PLT 206 171    BMET  Recent Labs  03/12/17 0623 03/13/17 0439  NA 141 143  K 4.4 4.2  CL 97* 104  CO2 31 25  GLUCOSE 158* 78  BUN 63* 50*  CREATININE 1.67* 1.46*  CALCIUM 10.0 8.6*   PT/INR No results for input(s): LABPROT, INR in the last 72 hours.   Recent Labs Lab 03/12/17 0623 03/13/17 0439  AST 26 18  ALT 16 12*  ALKPHOS 87 58  BILITOT 0.9 0.9  PROT 8.0 5.6*  ALBUMIN 4.6 3.1*     Lipase     Component Value Date/Time   LIPASE 29 03/12/2017 4270     Studies/Results: Ct Abdomen Pelvis Wo Contrast  Result Date: 03/12/2017 CLINICAL DATA:  Vomiting abdominal pain EXAM: CT ABDOMEN AND  PELVIS WITHOUT CONTRAST TECHNIQUE: Multidetector CT imaging of the abdomen and pelvis was performed following the standard protocol without IV contrast. COMPARISON:  09/11/2011 FINDINGS: Lower chest: No acute abnormality. Hepatobiliary: Small liver cysts are again identified. Previous cholecystectomy. Pancreas: Unremarkable. No pancreatic ductal dilatation or surrounding inflammatory changes. Spleen: Normal in size without focal abnormality. Adrenals/Urinary Tract: Adrenal glands are unremarkable. Small renal cysts are identified on the left. No mass or hydronephrosis identified. The urinary bladder appears within normal limits. Stomach/Bowel: There is a small hiatal hernia. Distension of the gastric lumen is identified. There are multiple abnormally dilated loops of small bowel with air-fluid levels. The small bowel loops measure up to 3.6 cm. No pathologic dilatation of the colon. Decreased caliber distal small bowel loops noted within the pelvis. Due to lack of sufficient enteric contrast material within mid and distal small bowel loops the transition point is difficult to identified but is likely at the level of the proximal or mid ileum. Vascular/Lymphatic: Aortic atherosclerosis. No aneurysm. No upper abdominal or pelvic adenopathy. A pelvic lymph node dissection has been performed. No inguinal adenopathy. Reproductive: Previous hysterectomy.  No adnexal mass. Other: There is no ascites or focal fluid collections within the abdomen or pelvis. Musculoskeletal: No aggressive lytic or aggressive sclerotic bone lesions identified. Scoliosis deformity involving the mid lumbar spine is convex towards the left. There is associated multi level degenerative disc disease identified. IMPRESSION: 1. Imaging features compatible with small bowel  obstruction. Transition point is likely at the level of the mid or distal ileum. 2. Aortic atherosclerosis. 3. Previous hysterectomy and pelvic lymph node dissection. Electronically  Signed   By: Kerby Moors M.D.   On: 03/12/2017 09:26   Dg Abd Portable 1 View  Result Date: 03/12/2017 CLINICAL DATA:  Abdominal pain.  Nausea. EXAM: PORTABLE ABDOMEN - 1 VIEW COMPARISON:  03/12/2017 . FINDINGS: NG tube noted with tip projected over the upper most portion of the stomach. Advancement of approximately 8 cm should be considered. No bowel distention. No free air. Thoracolumbar spine scoliosis. Diffuse degenerative change. No acute bony abnormality. Aortoiliac atherosclerotic vascular calcification. Surgical sutures and clips are noted over the abdomen and pelvis. IMPRESSION: 1.NG tube noted with its tip projected over the upper most portion stomach. Advancement of approximately 8 cm should be considered. No bowel distention. 2. Aortoiliac atherosclerotic vascular disease. Electronically Signed   By: Marcello Moores  Register   On: 03/12/2017 10:49    Medications: . fentaNYL (SUBLIMAZE) injection  50 mcg Intravenous Once  . heparin  5,000 Units Subcutaneous Q8H    Assessment/Plan SBO  - abdominal surgical history includes hysterectomy, cholecystectomy, sigmoid colectomy for colon cancer in 1989, and bladder surgery for bladder cancer 2013 - 1 day of lower abdominal pain, nausea, and vomiting - CT scan shows SBO with transition point likely at the level of the mid or distal ileum  Chronic systolic CHF - EF 29% in 9242 HTN CKD III  1.67 =>>1.46 today CAD HLD OSA H/o colon cancer s/p sigmoid colectomy H/o bladder cancer s/p chemotherapy and surgery   ID - none VTE - heparin FEN - IVF, NPO =>> pull NG and start clears     Plan: DC NG tube advance diet. I told her to walk daily, the more the better.    LOS: 0 days    Haydan Wedig 03/13/2017 [phone]

## 2017-03-13 NOTE — Progress Notes (Signed)
Pt refuses CPAP  RT to monitor and assess as needed.  

## 2017-03-13 NOTE — Progress Notes (Signed)
[facility] Progress Note  Patient: Paige Cook EXN:[id number]   PCP: Geoffery Lyons, MD DOB: [birth year]   DOA: 03/12/2017   DOS: 03/13/2017   Date of Service: the patient was seen and examined on 03/13/2017  Subjective: Feeling better, did have a BM yesterday, passing gas, NG output 500 mL in 24 hours. Abdominal pain still present.  Brief hospital course: Pt. with PMH of CHF, CAD, OSA, CKD stage III, HTN; admitted on 03/12/2017, with complaint of abdominal pain, was found to have small bowel obstruction on the CAT scan. Gen. surgery was consulted and recommended conservative management. Currently further plan is monitor her diet tolerance.  Assessment and Plan: 1. Small bowel obstruction. NG tube was inserted yesterday, currently had a bowel movement and is passing gas. Surgery recommends to remove the NG tube and attempt clear liquid diet. We will monitor patient's response. Continue IV fluids.  Chronic systolic CHF -Most recent 2D echo was done in 2013, EF was 45%.  There is no evidence of fluid overload.  With her current GI losses, I placed patient on IV fluids for 24 hours, reassess fluid status and small bowel obstruction status tomorrow morning and determine whether additional fluids are needed  Hypertension -She is strict n.p.o., IV hydralazine as needed  Chronic kidney disease stage III -Baseline creatinine is around 1.4-1.5, it is 1.67 this admission which is close to baseline  Coronary artery disease -Reports no chest pain or anginal type symptoms  Hyperlipidemia -Statin on hold due to n.p.o.  Obstructive sleep apnea -Nightly CPAP  Bowel regimen: last BM 03/12/2017 Diet: clear liquid diet DVT Prophylaxis: subcutaneous Heparin  Advance goals of care discussion: full code  Family Communication: family was present at bedside, at the time of interview. The pt provided permission to discuss medical plan with the family. Opportunity was given to ask  question and all questions were answered satisfactorily.   Disposition:  Discharge to home. Expected discharge date: 03/15/2017, pending diet tlerance  Consultants: general surgery Procedures: none  Antibiotics: Anti-infectives    None       Objective: Physical Exam: Vitals:   03/13/17 0501 03/13/17 1437 03/13/17 1439 03/13/17 1507  BP: (!) 149/65 (!) 117/38 (!) 135/41 (!) 117/59  Pulse: 84 81    Resp: 18 18    Temp: 98.9 F (37.2 C) 98.4 F (36.9 C)    TempSrc: Oral Oral    SpO2: 95% 98%    Weight:      Height:        Intake/Output Summary (Last 24 hours) at 03/13/17 1508 Last data filed at 03/13/17 1100  Gross per 24 hour  Intake             1575 ml  Output             1200 ml  Net              375 ml   Filed Weights   03/12/17 0610  Weight: 51.3 kg (113 lb)   General: Alert, Awake and Oriented to Time, Place and Person. Appear in mild distress, affect appropriate Eyes: PERRL, Conjunctiva normal ENT: Oral Mucosa clear moist. Neck: no JVD, no Abnormal Mass Or lumps Cardiovascular: S1 and S2 Present, aortic systolic Murmur, Respiratory: Bilateral Air entry equal and Decreased, no use of accessory muscle, noClear to Auscultation, no Crackles, no wheezes Abdomen: Bowel Sound present, Soft and mild tenderness Skin: no redness, no Rash, no induration Extremities: no Pedal edema, no calf tenderness Neurologic: Grossly  no focal neuro deficit. Bilaterally Equal motor strength  Data Reviewed: CBC:  Recent Labs Lab 03/12/17 0623 03/13/17 0439  WBC 10.7* 5.9  HGB 13.1 10.6*  HCT 38.3 30.9*  MCV 92.3 96.0  PLT 206 110   Basic Metabolic Panel:  Recent Labs Lab 03/12/17 0623 03/13/17 0439  NA 141 143  K 4.4 4.2  CL 97* 104  CO2 31 25  GLUCOSE 158* 78  BUN 63* 50*  CREATININE 1.67* 1.46*  CALCIUM 10.0 8.6*    Liver Function Tests:  Recent Labs Lab 03/12/17 0623 03/13/17 0439  AST 26 18  ALT 16 12*  ALKPHOS 87 58  BILITOT 0.9 0.9  PROT 8.0  5.6*  ALBUMIN 4.6 3.1*    Recent Labs Lab 03/12/17 0623  LIPASE 29   No results for input(s): AMMONIA in the last 168 hours. Coagulation Profile: No results for input(s): INR, PROTIME in the last 168 hours. Cardiac Enzymes: No results for input(s): CKTOTAL, CKMB, CKMBINDEX, TROPONINI in the last 168 hours. BNP (last 3 results) No results for input(s): PROBNP in the last 8760 hours. CBG: No results for input(s): GLUCAP in the last 168 hours. Studies: No results found.  Scheduled Meds: . fentaNYL (SUBLIMAZE) injection  50 mcg Intravenous Once  . heparin  5,000 Units Subcutaneous Q8H   Continuous Infusions: PRN Meds: hydrALAZINE, ondansetron (ZOFRAN) IV, polyethylene glycol  Time spent: 30 minutes  Author: Berle Mull, MD [facility] Pager: [phone] 03/13/2017 3:08 PM  If 7PM-7AM, please contact night-coverage at [email or web address], password [facility]

## 2017-03-14 MED ORDER — GABAPENTIN 300 MG PO CAPS
300.0000 mg | ORAL_CAPSULE | Freq: Three times a day (TID) | ORAL | Status: DC
  Filled 2017-03-14: qty 1

## 2017-03-14 MED ORDER — CARVEDILOL 3.125 MG PO TABS
3.1250 mg | ORAL_TABLET | Freq: Two times a day (BID) | ORAL | Status: DC
  Administered 2017-03-14 – 2017-03-15 (×2): 3.125 mg via ORAL
  Filled 2017-03-14 (×2): qty 1

## 2017-03-14 MED ORDER — ASPIRIN 81 MG PO CHEW
81.0000 mg | CHEWABLE_TABLET | Freq: Every day | ORAL | Status: DC
  Administered 2017-03-14 – 2017-03-15 (×2): 81 mg via ORAL
  Filled 2017-03-14 (×2): qty 1

## 2017-03-14 MED ORDER — ATORVASTATIN CALCIUM 20 MG PO TABS
20.0000 mg | ORAL_TABLET | Freq: Every day | ORAL | Status: DC
  Filled 2017-03-14: qty 1

## 2017-03-14 MED ORDER — ACETAMINOPHEN 325 MG PO TABS: 650.0000 mg | ORAL_TABLET | Freq: Four times a day (QID) | ORAL | Status: DC | PRN

## 2017-03-14 MED ORDER — POLYETHYLENE GLYCOL 3350 17 G PO PACK
17.0000 g | PACK | Freq: Every day | ORAL | Status: DC
  Administered 2017-03-14: 17 g via ORAL
  Filled 2017-03-14: qty 1

## 2017-03-14 NOTE — Progress Notes (Signed)
[facility] Progress Note  Patient: Paige Cook ASN:[id number]   PCP: Geoffery Lyons, MD DOB: [birth year]   DOA: 03/12/2017   DOS: 03/14/2017   Date of Service: the patient was seen and examined on 03/14/2017  Subjective: Feeling better,No further BM, tolerating clear liquids.  Brief hospital course: Pt. with PMH of CHF, CAD, OSA, CKD stage III, HTN; admitted on 03/12/2017, with complaint of abdominal pain, was found to have small bowel obstruction on the CAT scan. Gen. surgery was consulted and recommended conservative management. Currently further plan is monitor her diet tolerance.  Assessment and Plan: 1. Small bowel obstruction. NG tube was inserted on admission, currently had a bowel movement and is passing gas. General surgery was consulted, NG tube was removed, clear liquid diet tolerating, advancing to full liquid diet. Continue MiraLAX.   Chronic systolic CHF -Most recent 2D echo was done in 2013, EF was 45%.  There is no evidence of fluid overload.  With her current GI losses, I placed patient on IV fluids for 24 hours, reassess fluid status and small bowel obstruction status tomorrow morning and determine whether additional fluids are needed  Hypertension Blood pressure stable without any medication. We'll monitor  Chronic kidney disease stage III -Baseline creatinine is around 1.4-1.5, it is 1.67 this admission which is close to baseline  Coronary artery disease -Reports no chest pain or anginal type symptoms  Hyperlipidemia Resume statin tomorrow  Obstructive sleep apnea -Nightly CPAP  Bowel regimen: last BM 03/12/2017 Diet: Full liquid diet DVT Prophylaxis: subcutaneous Heparin  Advance goals of care discussion: full code  Family Communication: family was present at bedside, at the time of interview. The pt provided permission to discuss medical plan with the family. Opportunity was given to ask question and all questions were answered  satisfactorily.   Disposition:  Discharge to home. Expected discharge date: 03/15/2017, pending diet tlerance  Consultants: general surgery Procedures: none  Antibiotics: Anti-infectives    None       Objective: Physical Exam: Vitals:   03/13/17 1507 03/13/17 2100 03/14/17 0522 03/14/17 1438  BP: (!) 117/59 (!) 127/53 (!) 141/62 (!) 125/40  Pulse:  71 88 64  Resp:  18 18 18   Temp:  98.2 F (36.8 C) 97.6 F (36.4 C) 98 F (36.7 C)  TempSrc:  Oral Oral Oral  SpO2:  96% 97% 100%  Weight:      Height:        Intake/Output Summary (Last 24 hours) at 03/14/17 1559 Last data filed at 03/14/17 1438  Gross per 24 hour  Intake              240 ml  Output              525 ml  Net             -285 ml   Filed Weights   03/12/17 0610  Weight: 51.3 kg (113 lb)   General: Alert, Awake and Oriented to Time, Place and Person. Appear in mild distress, affect appropriate Eyes: PERRL, Conjunctiva normal ENT: Oral Mucosa clear moist. Neck: no JVD, no Abnormal Mass Or lumps Cardiovascular: S1 and S2 Present, aortic systolic Murmur, Respiratory: Bilateral Air entry equal and Decreased, no use of accessory muscle, noClear to Auscultation, no Crackles, no wheezes Abdomen: Bowel Sound present, Soft and mild tenderness Skin: no redness, no Rash, no induration Extremities: no Pedal edema, no calf tenderness Neurologic: Grossly no focal neuro deficit. Bilaterally Equal motor strength  Data Reviewed:  CBC:  Recent Labs Lab 03/12/17 0623 03/13/17 0439  WBC 10.7* 5.9  HGB 13.1 10.6*  HCT 38.3 30.9*  MCV 92.3 96.0  PLT 206 292   Basic Metabolic Panel:  Recent Labs Lab 03/12/17 0623 03/13/17 0439  NA 141 143  K 4.4 4.2  CL 97* 104  CO2 31 25  GLUCOSE 158* 78  BUN 63* 50*  CREATININE 1.67* 1.46*  CALCIUM 10.0 8.6*    Liver Function Tests:  Recent Labs Lab 03/12/17 0623 03/13/17 0439  AST 26 18  ALT 16 12*  ALKPHOS 87 58  BILITOT 0.9 0.9  PROT 8.0 5.6*  ALBUMIN  4.6 3.1*    Recent Labs Lab 03/12/17 0623  LIPASE 29   No results for input(s): AMMONIA in the last 168 hours. Coagulation Profile: No results for input(s): INR, PROTIME in the last 168 hours. Cardiac Enzymes: No results for input(s): CKTOTAL, CKMB, CKMBINDEX, TROPONINI in the last 168 hours. BNP (last 3 results) No results for input(s): PROBNP in the last 8760 hours. CBG: No results for input(s): GLUCAP in the last 168 hours. Studies: No results found.  Scheduled Meds: . heparin  5,000 Units Subcutaneous Q8H  . polyethylene glycol  17 g Oral Daily   Continuous Infusions: PRN Meds: acetaminophen, hydrALAZINE, ondansetron (ZOFRAN) IV  Time spent: 30 minutes  Author: Berle Mull, MD [facility] Pager: [phone] 03/14/2017 3:59 PM  If 7PM-7AM, please contact night-coverage at [email or web address], password [facility]

## 2017-03-14 NOTE — Progress Notes (Signed)
Pt refuses cpap, machine not in room.  Pt stated she is breathing better and has not used her home machine in months.  Pt was advised that RT is available all night and encouraged her to call, should she change her mind.

## 2017-03-14 NOTE — Progress Notes (Signed)
  Subjective: Tolerating clears, no BM yesterday, walked about 1000 steps per daughter.  No nausea or discomfort with the NG out and on clears.    Objective: Vital signs in last 24 hours: Temp:  [97.6 F (36.4 C)-98.4 F (36.9 C)] 97.6 F (36.4 C) (03/29 0522) Pulse Rate:  [71-88] 88 (03/29 0522) Resp:  [18] 18 (03/29 0522) BP: (117-141)/(38-62) 141/62 (03/29 0522) SpO2:  [96 %-98 %] 97 % (03/29 0522) Last BM Date: 03/12/17 1050 IV PO not recorded Urine 725 No BM recorded Afebrile, VSS No labs Intake/Output from previous day: 03/28 0701 - 03/29 0700 In: 1050 [I.V.:1050] Out: 725 [Urine:725] Intake/Output this shift: No intake/output data recorded.  General appearance: alert, cooperative and no distress GI: soft, non tender, few hypoactive BS.  No Bm yesterday  Lab Results:   Recent Labs  03/12/17 0623 03/13/17 0439  WBC 10.7* 5.9  HGB 13.1 10.6*  HCT 38.3 30.9*  PLT 206 171    BMET  Recent Labs  03/12/17 0623 03/13/17 0439  NA 141 143  K 4.4 4.2  CL 97* 104  CO2 31 25  GLUCOSE 158* 78  BUN 63* 50*  CREATININE 1.67* 1.46*  CALCIUM 10.0 8.6*   PT/INR No results for input(s): LABPROT, INR in the last 72 hours.   Recent Labs Lab 03/12/17 0623 03/13/17 0439  AST 26 18  ALT 16 12*  ALKPHOS 87 58  BILITOT 0.9 0.9  PROT 8.0 5.6*  ALBUMIN 4.6 3.1*     Lipase     Component Value Date/Time   LIPASE 29 03/12/2017 3016     Studies/Results: Dg Abd Portable 1 View  Result Date: 03/12/2017 CLINICAL DATA:  Abdominal pain.  Nausea. EXAM: PORTABLE ABDOMEN - 1 VIEW COMPARISON:  03/12/2017 . FINDINGS: NG tube noted with tip projected over the upper most portion of the stomach. Advancement of approximately 8 cm should be considered. No bowel distention. No free air. Thoracolumbar spine scoliosis. Diffuse degenerative change. No acute bony abnormality. Aortoiliac atherosclerotic vascular calcification. Surgical sutures and clips are noted over the abdomen  and pelvis. IMPRESSION: 1.NG tube noted with its tip projected over the upper most portion stomach. Advancement of approximately 8 cm should be considered. No bowel distention. 2. Aortoiliac atherosclerotic vascular disease. Electronically Signed   By: Marcello Moores  Register   On: 03/12/2017 10:49    Medications: . heparin  5,000 Units Subcutaneous Q8H  . polyethylene glycol  17 g Oral Daily    No IV fluids Assessment/Plan SBO  - abdominal surgical history includes hysterectomy, cholecystectomy, sigmoid colectomy for colon cancer in 1989, and bladder surgery for bladder cancer 2013 - 1 day of lower abdominal pain, nausea, and vomiting - CT scan shows SBO with transition point likely at the level of the mid or distal ileum  Chronic systolic CHF - EF 01% in 0932 HTN CKD III  1.67 =>>1.46 today CAD HLD OSA H/o colon cancer s/p sigmoid colectomy H/o bladder cancer s/p chemotherapy and surgery   ID - none VTE - heparin FEN - IVF/ clears  =>> try her on full liquids  Plan:  Continue to mobilize and see how she does with advanced diet.       LOS: 1 day    Trusten Hume 03/14/2017 [phone]

## 2017-03-15 MED ORDER — POLYETHYLENE GLYCOL 3350 17 G PO PACK: 17.0000 g | PACK | Freq: Every day | ORAL | 0 refills | Status: DC

## 2017-03-19 NOTE — Discharge Summary (Signed)
[facility] Discharge Summary   Patient: Paige Cook WPY:[id number]   PCP: Geoffery Lyons, MD DOB: 13-Nov-1928   Date of admission: 03/12/2017   Date of discharge: 03/15/2017    Discharge Diagnoses:  Active Problems:   Chronic systolic CHF (congestive heart failure) (HCC)   Hypercholesteremia   CAD (coronary artery disease)   OSA (obstructive sleep apnea)   SBO (small bowel obstruction)   CKD (chronic kidney disease), stage III   HTN (hypertension)   Admitted From: home Disposition:  home  Recommendations for Outpatient Follow-up:  1. Follow-up with PCP in 1-2 weeks. 2. A soft diet for the next few days.   Follow-up Information    ARONSON,RICHARD A, MD. Schedule an appointment as soon as possible for a visit in 1 week(s).   Specialty:  Internal Medicine Contact information: [address] [place] [address] [phone]          Diet recommendation: Soft diet for the next few days followed by regular diet.  Activity: The patient is advised to gradually reintroduce usual activities.  Discharge Condition: good  Code Status: Full code  History of present illness: As per the H and P dictated on admission, "Paige Cook is a 84 y.o. female with medical history significant of chronic systolic CHF, nonobstructive coronary artery disease, bladder cancer, colon cancer status post surgery, hypertension, hyperlipidemia, presents to the emergency room with chief complaint of abdominal pain and nausea and vomiting.  Patient has noticed that she has been having worsening epigastric abdominal pain that started last night.  She was awakened at 2 AM this morning with severe nausea and has had few episodes of nonbloody vomiting.  This has never happened to her before.  She is otherwise relatively healthy, she denies any chest pains, she has no shortness of breath.  She denies any fever or chills.  She has no lightheadedness or dizziness."  Hospital Course:  Summary of her  active problems in the hospital is as following. 1. Small bowel obstruction. NG tube was inserted on admission, now have bowel movement and is passing gas. General surgery was consulted, NG tube was removed, clear liquid diet tolerating, advancing soft diet and tolerating that as well. Continue MiraLAX.   Chronic systolic CHF -Most recent 2D echo was done in 2013, EF was 45%. There is no evidence of fluid overload.  Resuming home medication on discharge but recommended to discuss with PCP regarding changing some of the medication in the setting of chronic kidney disease.  Hypertension Blood pressure stable without any medication. We'll monitor  Chronic kidney disease stage III -Baseline creatinine is around 1.4-1.5, it is 1.67 this admission which is close to baseline  Coronary artery disease -Reports no chest pain or anginal type symptoms  Hyperlipidemia Resume statin  Obstructive sleep apnea -Nightly CPAP  All other chronic medical condition were stable during the hospitalization.  Patient was ambulatory without any assistance. On the day of the discharge the patient's vitals were stable, and no other acute medical condition were reported by patient. the patient was felt safe to be discharge at home with familt.  Procedures and Results:  none   Consultations:  General surgery   DISCHARGE MEDICATION: Discharge Medication List as of 03/15/2017 10:32 AM    START taking these medications   Details  polyethylene glycol (MIRALAX / GLYCOLAX) packet Take 17 g by mouth daily., Starting Fri 03/15/2017, Normal      CONTINUE these medications which have NOT CHANGED   Details  aspirin 81 MG chewable tablet Chew 81 mg by mouth daily with breakfast., Historical Med    atorvastatin (LIPITOR) 20 MG tablet Take 1 tablet (20 mg total) by mouth daily., Starting Fri 04/29/2015, Normal    calcium-vitamin D (OSCAL WITH D) 500-200 MG-UNIT per tablet Take 1 tablet by mouth every  evening. , Historical Med    carvedilol (COREG) 6.25 MG tablet TAKE 1 TABLET BY MOUTH TWICE A DAY WITH A MEAL., Normal    furosemide (LASIX) 40 MG tablet TAKE 1 TABLET (40 MG TOTAL) BY MOUTH DAILY., Normal    glucosamine-chondroitin 500-400 MG tablet Take 1 tablet by mouth daily with breakfast. , Historical Med    ramipril (ALTACE) 5 MG capsule TAKE 1 CAPSULE (5 MG TOTAL) BY MOUTH DAILY., Normal    vitamin C (ASCORBIC ACID) 250 MG tablet Take 250 mg by mouth daily with breakfast., Historical Med       Allergies  Allergen Reactions  . Penicillins Hives    Has patient had a PCN reaction causing immediate rash, facial/tongue/throat swelling, SOB or lightheadedness with hypotension: Yes Has patient had a PCN reaction causing severe rash involving mucus membranes or skin necrosis: Yes Has patient had a PCN reaction that required hospitalization No Has patient had a PCN reaction occurring within the last 10 years: No If all of the above answers are "NO", then may proceed with Cephalosporin use.   . Tramadol Nausea Only and Other (See Comments)    shakes   Discharge Instructions    Diet - low sodium heart healthy    Complete by:  As directed    Discharge instructions    Complete by:  As directed    It is important that you read following instructions as well as go over your medication list with RN to help you understand your care after this hospitalization.  Discharge Instructions: Please follow-up with PCP in one week  Please request your primary care physician to go over all Hospital Tests and Procedure/Radiological results at the follow up,  Please get all Hospital records sent to your PCP by signing hospital release before you go home.   Do not take more than prescribed Pain, Sleep and Anxiety Medications. You were cared for by a hospitalist during your hospital stay. If you have any questions about your discharge medications or the care you received while you were in the  hospital after you are discharged, you can call the unit and ask to speak with the hospitalist on call if the hospitalist that took care of you is not available.  Once you are discharged, your primary care physician will handle any further medical issues. Please note that NO REFILLS for any discharge medications will be authorized once you are discharged, as it is imperative that you return to your primary care physician (or establish a relationship with a primary care physician if you do not have one) for your aftercare needs so that they can reassess your need for medications and monitor your lab values. You Must read complete instructions/literature along with all the possible adverse reactions/side effects for all the Medicines you take and that have been prescribed to you. Take any new Medicines after you have completely understood and accept all the possible adverse reactions/side effects. Wear Seat belts while driving. If you have smoked or chewed Tobacco in the last 2 yrs please stop smoking and/or stop any Recreational drug use.   Increase activity slowly    Complete by:  As directed  Discharge Exam: Filed Weights   03/12/17 0610  Weight: 51.3 kg (113 lb)   Vitals:   03/15/17 0551 03/15/17 0823  BP: (!) 145/50 (!) 148/92  Pulse: 65 68  Resp: 18   Temp: 98.6 F (37 C)    General: Appear in no distress, no Rash; Oral Mucosa moist. Cardiovascular: S1 and S2 Present, aortic systolic Murmur, no JVD Respiratory: Bilateral Air entry present and Clear to Auscultation, no Crackles, no wheezes Abdomen: Bowel Sound present, Soft and no tenderness Extremities: no Pedal edema, no calf tenderness Neurology: Grossly no focal neuro deficit.  The results of significant diagnostics from this hospitalization (including imaging, microbiology, ancillary and laboratory) are listed below for reference.    Significant Diagnostic Studies: Ct Abdomen Pelvis Wo Contrast  Result Date:  03/12/2017 CLINICAL DATA:  Vomiting abdominal pain EXAM: CT ABDOMEN AND PELVIS WITHOUT CONTRAST TECHNIQUE: Multidetector CT imaging of the abdomen and pelvis was performed following the standard protocol without IV contrast. COMPARISON:  09/11/2011 FINDINGS: Lower chest: No acute abnormality. Hepatobiliary: Small liver cysts are again identified. Previous cholecystectomy. Pancreas: Unremarkable. No pancreatic ductal dilatation or surrounding inflammatory changes. Spleen: Normal in size without focal abnormality. Adrenals/Urinary Tract: Adrenal glands are unremarkable. Small renal cysts are identified on the left. No mass or hydronephrosis identified. The urinary bladder appears within normal limits. Stomach/Bowel: There is a small hiatal hernia. Distension of the gastric lumen is identified. There are multiple abnormally dilated loops of small bowel with air-fluid levels. The small bowel loops measure up to 3.6 cm. No pathologic dilatation of the colon. Decreased caliber distal small bowel loops noted within the pelvis. Due to lack of sufficient enteric contrast material within mid and distal small bowel loops the transition point is difficult to identified but is likely at the level of the proximal or mid ileum. Vascular/Lymphatic: Aortic atherosclerosis. No aneurysm. No upper abdominal or pelvic adenopathy. A pelvic lymph node dissection has been performed. No inguinal adenopathy. Reproductive: Previous hysterectomy.  No adnexal mass. Other: There is no ascites or focal fluid collections within the abdomen or pelvis. Musculoskeletal: No aggressive lytic or aggressive sclerotic bone lesions identified. Scoliosis deformity involving the mid lumbar spine is convex towards the left. There is associated multi level degenerative disc disease identified. IMPRESSION: 1. Imaging features compatible with small bowel obstruction. Transition point is likely at the level of the mid or distal ileum. 2. Aortic atherosclerosis.  3. Previous hysterectomy and pelvic lymph node dissection. Electronically Signed   By: Kerby Moors M.D.   On: 03/12/2017 09:26   Dg Abd Portable 1 View  Result Date: 03/12/2017 CLINICAL DATA:  Abdominal pain.  Nausea. EXAM: PORTABLE ABDOMEN - 1 VIEW COMPARISON:  03/12/2017 . FINDINGS: NG tube noted with tip projected over the upper most portion of the stomach. Advancement of approximately 8 cm should be considered. No bowel distention. No free air. Thoracolumbar spine scoliosis. Diffuse degenerative change. No acute bony abnormality. Aortoiliac atherosclerotic vascular calcification. Surgical sutures and clips are noted over the abdomen and pelvis. IMPRESSION: 1.NG tube noted with its tip projected over the upper most portion stomach. Advancement of approximately 8 cm should be considered. No bowel distention. 2. Aortoiliac atherosclerotic vascular disease. Electronically Signed   By: Marcello Moores  Register   On: 03/12/2017 10:49    Microbiology: No results found for this or any previous visit (from the past 240 hour(s)).   Labs: CBC:  Recent Labs Lab 03/13/17 0439  WBC 5.9  HGB 10.6*  HCT 30.9*  MCV 96.0  PLT 072   Basic Metabolic Panel:  Recent Labs Lab 03/13/17 0439  NA 143  K 4.2  CL 104  CO2 25  GLUCOSE 78  BUN 50*  CREATININE 1.46*  CALCIUM 8.6*   Liver Function Tests:  Recent Labs Lab 03/13/17 0439  AST 18  ALT 12*  ALKPHOS 58  BILITOT 0.9  PROT 5.6*  ALBUMIN 3.1*   No results for input(s): LIPASE, AMYLASE in the last 168 hours. No results for input(s): AMMONIA in the last 168 hours. Cardiac Enzymes: No results for input(s): CKTOTAL, CKMB, CKMBINDEX, TROPONINI in the last 168 hours. BNP (last 3 results) No results for input(s): BNP in the last 8760 hours. CBG: No results for input(s): GLUCAP in the last 168 hours. Time spent: 35 minutes  Signed:  Berle Mull  [facility] 03/15/2017 , 5:44 PM

## 2017-04-17 DIAGNOSIS — N2889 Other specified disorders of kidney and ureter: Secondary | ICD-10-CM | POA: Diagnosis not present

## 2017-04-17 DIAGNOSIS — Z08 Encounter for follow-up examination after completed treatment for malignant neoplasm: Secondary | ICD-10-CM | POA: Diagnosis not present

## 2017-04-17 DIAGNOSIS — E278 Other specified disorders of adrenal gland: Secondary | ICD-10-CM | POA: Diagnosis not present

## 2017-04-17 DIAGNOSIS — E279 Disorder of adrenal gland, unspecified: Secondary | ICD-10-CM | POA: Diagnosis not present

## 2017-04-17 DIAGNOSIS — Z8551 Personal history of malignant neoplasm of bladder: Secondary | ICD-10-CM | POA: Diagnosis not present

## 2017-04-17 DIAGNOSIS — M419 Scoliosis, unspecified: Secondary | ICD-10-CM | POA: Diagnosis not present

## 2017-04-17 DIAGNOSIS — Z9221 Personal history of antineoplastic chemotherapy: Secondary | ICD-10-CM | POA: Diagnosis not present

## 2017-04-17 DIAGNOSIS — C678 Malignant neoplasm of overlapping sites of bladder: Secondary | ICD-10-CM | POA: Diagnosis not present

## 2017-05-03 DIAGNOSIS — M419 Scoliosis, unspecified: Secondary | ICD-10-CM | POA: Diagnosis not present

## 2017-05-03 DIAGNOSIS — M5136 Other intervertebral disc degeneration, lumbar region: Secondary | ICD-10-CM | POA: Diagnosis not present

## 2017-05-03 DIAGNOSIS — Z681 Body mass index (BMI) 19 or less, adult: Secondary | ICD-10-CM | POA: Diagnosis not present

## 2017-05-09 ENCOUNTER — Telehealth: Payer: Self-pay | Admitting: Cardiology

## 2017-05-09 DIAGNOSIS — M419 Scoliosis, unspecified: Secondary | ICD-10-CM | POA: Diagnosis not present

## 2017-05-09 DIAGNOSIS — M40209 Unspecified kyphosis, site unspecified: Secondary | ICD-10-CM | POA: Diagnosis not present

## 2017-05-09 NOTE — Telephone Encounter (Signed)
Patient stated that she has been prescribed Meloxicam and wanted to know if it is safe to take with the baby aspirin.

## 2017-05-09 NOTE — Telephone Encounter (Signed)
New message    Pt is calling to find out if it is ok for her to take meloxicam for pain. One of her doctors prescribed it for her.

## 2017-05-09 NOTE — Telephone Encounter (Signed)
Okay to take meloxican and baby aspirin if therapy only for 3-7days.   If needed for longer period of time; please have meloxican prescribed to re-assess his therapy.  Risk for GI irritation and GI bleed discusses with patient.

## 2017-05-09 NOTE — Telephone Encounter (Signed)
Per the patient, Pharmacy called her to advise her on the combination. Please see their note.

## 2017-05-15 ENCOUNTER — Other Ambulatory Visit: Payer: Self-pay | Admitting: Orthopedic Surgery

## 2017-05-15 DIAGNOSIS — R293 Abnormal posture: Secondary | ICD-10-CM | POA: Diagnosis not present

## 2017-05-15 DIAGNOSIS — M4120 Other idiopathic scoliosis, site unspecified: Secondary | ICD-10-CM | POA: Diagnosis not present

## 2017-05-15 DIAGNOSIS — M419 Scoliosis, unspecified: Secondary | ICD-10-CM

## 2017-05-20 DIAGNOSIS — M4120 Other idiopathic scoliosis, site unspecified: Secondary | ICD-10-CM | POA: Diagnosis not present

## 2017-05-20 DIAGNOSIS — R293 Abnormal posture: Secondary | ICD-10-CM | POA: Diagnosis not present

## 2017-05-22 DIAGNOSIS — R293 Abnormal posture: Secondary | ICD-10-CM | POA: Diagnosis not present

## 2017-05-22 DIAGNOSIS — M4120 Other idiopathic scoliosis, site unspecified: Secondary | ICD-10-CM | POA: Diagnosis not present

## 2017-05-27 ENCOUNTER — Ambulatory Visit
Admission: RE | Admit: 2017-05-27 | Discharge: 2017-05-27 | Disposition: A | Discharge status: Home / Self Care | Payer: Medicare Other | Source: Ambulatory Visit | Attending: Orthopedic Surgery | Admitting: Orthopedic Surgery

## 2017-05-27 DIAGNOSIS — M419 Scoliosis, unspecified: Secondary | ICD-10-CM

## 2017-05-27 DIAGNOSIS — M4186 Other forms of scoliosis, lumbar region: Secondary | ICD-10-CM | POA: Diagnosis not present

## 2017-05-29 DIAGNOSIS — R293 Abnormal posture: Secondary | ICD-10-CM | POA: Diagnosis not present

## 2017-05-29 DIAGNOSIS — M4120 Other idiopathic scoliosis, site unspecified: Secondary | ICD-10-CM | POA: Diagnosis not present

## 2017-06-03 DIAGNOSIS — M4120 Other idiopathic scoliosis, site unspecified: Secondary | ICD-10-CM | POA: Diagnosis not present

## 2017-06-03 DIAGNOSIS — R293 Abnormal posture: Secondary | ICD-10-CM | POA: Diagnosis not present

## 2017-06-06 DIAGNOSIS — M4120 Other idiopathic scoliosis, site unspecified: Secondary | ICD-10-CM | POA: Diagnosis not present

## 2017-06-06 DIAGNOSIS — R293 Abnormal posture: Secondary | ICD-10-CM | POA: Diagnosis not present

## 2017-06-11 DIAGNOSIS — R293 Abnormal posture: Secondary | ICD-10-CM | POA: Diagnosis not present

## 2017-06-11 DIAGNOSIS — M4120 Other idiopathic scoliosis, site unspecified: Secondary | ICD-10-CM | POA: Diagnosis not present

## 2017-06-12 DIAGNOSIS — I10 Essential (primary) hypertension: Secondary | ICD-10-CM | POA: Diagnosis not present

## 2017-06-12 DIAGNOSIS — E784 Other hyperlipidemia: Secondary | ICD-10-CM | POA: Diagnosis not present

## 2017-06-12 DIAGNOSIS — M859 Disorder of bone density and structure, unspecified: Secondary | ICD-10-CM | POA: Diagnosis not present

## 2017-06-13 DIAGNOSIS — R293 Abnormal posture: Secondary | ICD-10-CM | POA: Diagnosis not present

## 2017-06-13 DIAGNOSIS — M4120 Other idiopathic scoliosis, site unspecified: Secondary | ICD-10-CM | POA: Diagnosis not present

## 2017-06-17 DIAGNOSIS — Z681 Body mass index (BMI) 19 or less, adult: Secondary | ICD-10-CM | POA: Diagnosis not present

## 2017-06-17 DIAGNOSIS — D638 Anemia in other chronic diseases classified elsewhere: Secondary | ICD-10-CM | POA: Diagnosis not present

## 2017-06-17 DIAGNOSIS — Z1389 Encounter for screening for other disorder: Secondary | ICD-10-CM | POA: Diagnosis not present

## 2017-06-17 DIAGNOSIS — M5136 Other intervertebral disc degeneration, lumbar region: Secondary | ICD-10-CM | POA: Diagnosis not present

## 2017-06-17 DIAGNOSIS — I2510 Atherosclerotic heart disease of native coronary artery without angina pectoris: Secondary | ICD-10-CM | POA: Diagnosis not present

## 2017-06-17 DIAGNOSIS — Z23 Encounter for immunization: Secondary | ICD-10-CM | POA: Diagnosis not present

## 2017-06-17 DIAGNOSIS — I491 Atrial premature depolarization: Secondary | ICD-10-CM | POA: Diagnosis not present

## 2017-06-17 DIAGNOSIS — E871 Hypo-osmolality and hyponatremia: Secondary | ICD-10-CM | POA: Diagnosis not present

## 2017-06-17 DIAGNOSIS — Z8551 Personal history of malignant neoplasm of bladder: Secondary | ICD-10-CM | POA: Diagnosis not present

## 2017-06-17 DIAGNOSIS — N184 Chronic kidney disease, stage 4 (severe): Secondary | ICD-10-CM | POA: Diagnosis not present

## 2017-06-17 DIAGNOSIS — I129 Hypertensive chronic kidney disease with stage 1 through stage 4 chronic kidney disease, or unspecified chronic kidney disease: Secondary | ICD-10-CM | POA: Diagnosis not present

## 2017-06-17 DIAGNOSIS — G4733 Obstructive sleep apnea (adult) (pediatric): Secondary | ICD-10-CM | POA: Diagnosis not present

## 2017-06-17 DIAGNOSIS — Z0000 Encounter for general adult medical examination without abnormal findings: Secondary | ICD-10-CM | POA: Diagnosis not present

## 2017-06-20 DIAGNOSIS — R2689 Other abnormalities of gait and mobility: Secondary | ICD-10-CM | POA: Diagnosis not present

## 2017-06-20 DIAGNOSIS — M4120 Other idiopathic scoliosis, site unspecified: Secondary | ICD-10-CM | POA: Diagnosis not present

## 2017-06-20 DIAGNOSIS — R531 Weakness: Secondary | ICD-10-CM | POA: Diagnosis not present

## 2017-06-20 DIAGNOSIS — R293 Abnormal posture: Secondary | ICD-10-CM | POA: Diagnosis not present

## 2017-06-20 DIAGNOSIS — Z7409 Other reduced mobility: Secondary | ICD-10-CM | POA: Diagnosis not present

## 2017-06-21 DIAGNOSIS — M419 Scoliosis, unspecified: Secondary | ICD-10-CM | POA: Diagnosis not present

## 2017-06-21 DIAGNOSIS — M5416 Radiculopathy, lumbar region: Secondary | ICD-10-CM | POA: Diagnosis not present

## 2017-06-21 DIAGNOSIS — M47816 Spondylosis without myelopathy or radiculopathy, lumbar region: Secondary | ICD-10-CM | POA: Diagnosis not present

## 2017-06-21 DIAGNOSIS — M40209 Unspecified kyphosis, site unspecified: Secondary | ICD-10-CM | POA: Diagnosis not present

## 2017-06-24 DIAGNOSIS — R293 Abnormal posture: Secondary | ICD-10-CM | POA: Diagnosis not present

## 2017-06-24 DIAGNOSIS — Z7409 Other reduced mobility: Secondary | ICD-10-CM | POA: Diagnosis not present

## 2017-06-24 DIAGNOSIS — R2689 Other abnormalities of gait and mobility: Secondary | ICD-10-CM | POA: Diagnosis not present

## 2017-06-24 DIAGNOSIS — M4120 Other idiopathic scoliosis, site unspecified: Secondary | ICD-10-CM | POA: Diagnosis not present

## 2017-06-24 DIAGNOSIS — R531 Weakness: Secondary | ICD-10-CM | POA: Diagnosis not present

## 2017-07-01 DIAGNOSIS — M5416 Radiculopathy, lumbar region: Secondary | ICD-10-CM | POA: Diagnosis not present

## 2017-07-15 DIAGNOSIS — M5416 Radiculopathy, lumbar region: Secondary | ICD-10-CM | POA: Diagnosis not present

## 2017-07-15 DIAGNOSIS — M47816 Spondylosis without myelopathy or radiculopathy, lumbar region: Secondary | ICD-10-CM | POA: Diagnosis not present

## 2017-07-30 DIAGNOSIS — M47816 Spondylosis without myelopathy or radiculopathy, lumbar region: Secondary | ICD-10-CM | POA: Diagnosis not present

## 2017-08-06 DIAGNOSIS — M47816 Spondylosis without myelopathy or radiculopathy, lumbar region: Secondary | ICD-10-CM | POA: Diagnosis not present

## 2017-08-25 ENCOUNTER — Other Ambulatory Visit: Payer: Self-pay | Admitting: Cardiology

## 2017-09-04 DIAGNOSIS — M5416 Radiculopathy, lumbar region: Secondary | ICD-10-CM | POA: Diagnosis not present

## 2017-09-04 DIAGNOSIS — M47817 Spondylosis without myelopathy or radiculopathy, lumbosacral region: Secondary | ICD-10-CM | POA: Diagnosis not present

## 2017-09-04 DIAGNOSIS — M47816 Spondylosis without myelopathy or radiculopathy, lumbar region: Secondary | ICD-10-CM | POA: Diagnosis not present

## 2017-10-03 DIAGNOSIS — M419 Scoliosis, unspecified: Secondary | ICD-10-CM | POA: Diagnosis not present

## 2017-10-03 DIAGNOSIS — M40209 Unspecified kyphosis, site unspecified: Secondary | ICD-10-CM | POA: Diagnosis not present

## 2017-10-03 DIAGNOSIS — M47816 Spondylosis without myelopathy or radiculopathy, lumbar region: Secondary | ICD-10-CM | POA: Diagnosis not present

## 2017-10-21 DIAGNOSIS — M47816 Spondylosis without myelopathy or radiculopathy, lumbar region: Secondary | ICD-10-CM | POA: Diagnosis not present

## 2017-11-08 ENCOUNTER — Other Ambulatory Visit: Payer: Self-pay | Admitting: Cardiology

## 2017-11-11 DIAGNOSIS — M47816 Spondylosis without myelopathy or radiculopathy, lumbar region: Secondary | ICD-10-CM | POA: Diagnosis not present

## 2017-11-11 NOTE — Telephone Encounter (Signed)
Rx(s) sent to pharmacy electronically.  

## 2017-11-30 ENCOUNTER — Other Ambulatory Visit: Payer: Self-pay | Admitting: Cardiology

## 2017-12-09 DIAGNOSIS — Z23 Encounter for immunization: Secondary | ICD-10-CM | POA: Diagnosis not present

## 2017-12-12 DIAGNOSIS — Z23 Encounter for immunization: Secondary | ICD-10-CM | POA: Diagnosis not present

## 2017-12-12 DIAGNOSIS — M5136 Other intervertebral disc degeneration, lumbar region: Secondary | ICD-10-CM | POA: Diagnosis not present

## 2017-12-12 DIAGNOSIS — I129 Hypertensive chronic kidney disease with stage 1 through stage 4 chronic kidney disease, or unspecified chronic kidney disease: Secondary | ICD-10-CM | POA: Diagnosis not present

## 2017-12-12 DIAGNOSIS — M859 Disorder of bone density and structure, unspecified: Secondary | ICD-10-CM | POA: Diagnosis not present

## 2017-12-12 DIAGNOSIS — N184 Chronic kidney disease, stage 4 (severe): Secondary | ICD-10-CM | POA: Diagnosis not present

## 2017-12-12 DIAGNOSIS — E7849 Other hyperlipidemia: Secondary | ICD-10-CM | POA: Diagnosis not present

## 2017-12-12 DIAGNOSIS — I2510 Atherosclerotic heart disease of native coronary artery without angina pectoris: Secondary | ICD-10-CM | POA: Diagnosis not present

## 2017-12-12 DIAGNOSIS — G4733 Obstructive sleep apnea (adult) (pediatric): Secondary | ICD-10-CM | POA: Diagnosis not present

## 2017-12-12 DIAGNOSIS — I491 Atrial premature depolarization: Secondary | ICD-10-CM | POA: Diagnosis not present

## 2017-12-12 DIAGNOSIS — D638 Anemia in other chronic diseases classified elsewhere: Secondary | ICD-10-CM | POA: Diagnosis not present

## 2017-12-12 DIAGNOSIS — I509 Heart failure, unspecified: Secondary | ICD-10-CM | POA: Diagnosis not present

## 2017-12-12 DIAGNOSIS — I10 Essential (primary) hypertension: Secondary | ICD-10-CM | POA: Diagnosis not present

## 2017-12-31 NOTE — Progress Notes (Signed)
Paige Cook Date of Birth: Mar 29, 1928 Medical Record #[id number]  History of Present Illness: [name] is seen for follow up Cardiac disease. She has mild LV dysfunction with EF 45% and very frequent PACs on past Holter. No atrial fib has been identified. Other issues include nonobstructive CAD, PVCs, HLD, bladder cancer, and OSA. Has 0 to 39% bilateral carotid disease with last duplex in October 2014.  She has a history of sleep apnea and using CPAP therapy.  She reports that she is feeling very well from a cardiac standpoint. Still tries to exercise and stay active. She is primarily limited by back pain and scoliosis.  No chest pain or dyspnea. No palpitations. She is taking 1/2 lasix daily and will rarely need to take extra.    Current Outpatient Medications on File Prior to Visit  Medication Sig Dispense Refill  . aspirin 81 MG chewable tablet Chew 81 mg by mouth daily with breakfast.    . atorvastatin (LIPITOR) 20 MG tablet Take 1 tablet (20 mg total) by mouth daily. 30 tablet 6  . calcium-vitamin D (OSCAL WITH D) 500-200 MG-UNIT per tablet Take 1 tablet by mouth every evening.     . carvedilol (COREG) 6.25 MG tablet Take 1 tablet (6.25 mg total) by mouth 2 (two) times daily with a meal. PLEASE CONTACT OFFICE 15 tablet 0  . furosemide (LASIX) 40 MG tablet Take 1 tablet (40 mg total) by mouth daily. PLEASE CONTACT OFFICE FOR ADDITIONAL REFILLS 7 tablet 0  . glucosamine-chondroitin 500-400 MG tablet Take 1 tablet by mouth daily with breakfast.     . polyethylene glycol (MIRALAX / GLYCOLAX) packet Take 17 g by mouth daily. 14 each 0  . ramipril (ALTACE) 5 MG capsule TAKE 1 CAPSULE (5 MG TOTAL) BY MOUTH DAILY. 30 capsule 0  . vitamin C (ASCORBIC ACID) 250 MG tablet Take 250 mg by mouth daily with breakfast.     No current facility-administered medications on file prior to visit.     Allergies  Allergen Reactions  . Penicillins Hives    Has patient had a PCN reaction causing immediate  rash, facial/tongue/throat swelling, SOB or lightheadedness with hypotension: Yes Has patient had a PCN reaction causing severe rash involving mucus membranes or skin necrosis: Yes Has patient had a PCN reaction that required hospitalization No Has patient had a PCN reaction occurring within the last 10 years: No If all of the above answers are "NO", then may proceed with Cephalosporin use.   . Tramadol Nausea Only and Other (See Comments)    shakes    Past Medical History:  Diagnosis Date  . Anemia   . Arthritis   . Bladder cancer ([place])    giant tumor  . CAD (coronary artery disease)   . Cardiac arrhythmia    benign  . Carotid bruit    left  . CHF (congestive heart failure) (HCC)    CHF with systolic dysfuntion ; dilated nonishemic cardiomyopathy  . Colon cancer ([place]) 1989  . Colon polyps   . Diverticulosis   . Epistaxis   . Female bladder prolapse   . Gallstones   . Hypercholesteremia   . Hyponatremia    Probably secondary to her CHF  . OSA (obstructive sleep apnea) 04/29/2012  . SOB (shortness of breath)    Sometimes at night    Past Surgical History:  Procedure Laterality Date  . ABDOMINAL HYSTERECTOMY    . BLADDER SURGERY  2013   cancer removed  .  bladder tact    . CARDIAC CATHETERIZATION     non obstruction CAD  . CHOLECYSTECTOMY    . 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 COLONOSCOPY      Social History   Tobacco Use  Smoking Status Never Smoker  Smokeless Tobacco Never Used    Social History   Substance and Sexual Activity  Alcohol Use No    Family History  Problem Relation Age of Onset  . Heart disease Father   . Heart attack Father   . Heart attack Mother   . Heart failure Sister 76  . Stroke Neg Hx     Review of Systems: The review of systems is per the HPI.  All other systems were reviewed and are negative.  Physical Exam: BP 110/74 (BP Location: Left Arm, Patient Position: Sitting, Cuff Size: Normal)   Pulse 68   Ht 5\' 5"  (1.651  m)   Wt 110 lb 9.6 oz (50.2 kg)   BMI 18.40 kg/m  GENERAL:  Well appearing, elderly WF in NAD HEENT:  PERRL, EOMI, sclera are clear. Oropharynx is clear. NECK:  No jugular venous distention, carotid upstroke brisk and symmetric, no bruits, no thyromegaly or adenopathy LUNGS:  Clear to auscultation bilaterally CHEST:  Unremarkable HEART:  RRR,  PMI not displaced or sustained,S1 and S2 within normal limits, no S3, no S4: no clicks, no rubs, gr 2/6 systolic murmur LUSB ABD:  Soft, nontender. BS +, no masses or bruits. No hepatomegaly, no splenomegaly EXT:  2 + pulses throughout, no edema, no cyanosis no clubbing Back: marked scoliosis. SKIN:  Warm and dry.  No rashes NEURO:  Alert and oriented x 3. Cranial nerves II through XII intact. PSYCH:  Cognitively intact    LABORATORY DATA:   Lab Results  Component Value Date   WBC 5.9 03/13/2017   HGB 10.6 (L) 03/13/2017   HCT 30.9 (L) 03/13/2017   PLT 171 03/13/2017   GLUCOSE 78 03/13/2017   CHOL 149 11/03/2015   TRIG 74 11/03/2015   HDL 68 11/03/2015   LDLDIRECT 152.9 03/06/2013   LDLCALC 66 11/03/2015   ALT 12 (L) 03/13/2017   AST 18 03/13/2017   NA 143 03/13/2017   K 4.2 03/13/2017   CL 104 03/13/2017   CREATININE 1.46 (H) 03/13/2017   BUN 50 (H) 03/13/2017   CO2 25 03/13/2017   TSH 0.82 07/25/2012   INR 0.97 01/21/2011   Labs dated 09/26/17: cholesterol 121, triglycerides 126, HDL 67, Hgb 9.7. Chemistries normal.   Ecg today shows NSR with occ PVCs.Nonspecific ST-T wave abnormality. I have personally reviewed and interpreted this study.   Assessment / Plan: 1.  PACs/PVCs - continue with beta blocker. She is asymptomatic  2. Nonobstructive CAD - no symptoms reported.   3. Carotid disease - no symptoms reported. Last carotid dopplers showed only mild disease.   4. HLD- excellent control  6. Sleep apnea.  on CPAP  7. Nonischemic cardiomyopathy EF 45% in 2013. Asymptomatic. On ACEi, and beta blocker.   Follow up in  one year.

## 2018-01-02 ENCOUNTER — Ambulatory Visit (INDEPENDENT_AMBULATORY_CARE_PROVIDER_SITE_OTHER): Payer: Medicare Other | Admitting: Cardiology

## 2018-01-02 ENCOUNTER — Encounter: Payer: Self-pay | Admitting: Cardiology

## 2018-01-02 VITALS — BP 110/74 | HR 68 | Ht 65.0 in | Wt 110.6 lb

## 2018-01-02 DIAGNOSIS — I493 Ventricular premature depolarization: Secondary | ICD-10-CM

## 2018-01-02 DIAGNOSIS — E7800 Pure hypercholesterolemia, unspecified: Secondary | ICD-10-CM

## 2018-01-02 DIAGNOSIS — I2510 Atherosclerotic heart disease of native coronary artery without angina pectoris: Secondary | ICD-10-CM | POA: Diagnosis not present

## 2018-01-02 NOTE — Patient Instructions (Signed)
Continue your current therapy  I will see you in one year   

## 2018-01-12 ENCOUNTER — Other Ambulatory Visit: Payer: Self-pay | Admitting: Cardiology

## 2018-01-22 DIAGNOSIS — M419 Scoliosis, unspecified: Secondary | ICD-10-CM | POA: Diagnosis not present

## 2018-01-22 DIAGNOSIS — M47817 Spondylosis without myelopathy or radiculopathy, lumbosacral region: Secondary | ICD-10-CM | POA: Diagnosis not present

## 2018-01-22 DIAGNOSIS — M47816 Spondylosis without myelopathy or radiculopathy, lumbar region: Secondary | ICD-10-CM | POA: Diagnosis not present

## 2018-01-25 ENCOUNTER — Other Ambulatory Visit: Payer: Self-pay | Admitting: Cardiology

## 2018-02-21 DIAGNOSIS — M419 Scoliosis, unspecified: Secondary | ICD-10-CM | POA: Diagnosis not present

## 2018-02-21 DIAGNOSIS — M47816 Spondylosis without myelopathy or radiculopathy, lumbar region: Secondary | ICD-10-CM | POA: Diagnosis not present

## 2018-02-21 DIAGNOSIS — M791 Myalgia, unspecified site: Secondary | ICD-10-CM | POA: Diagnosis not present

## 2018-04-16 DIAGNOSIS — C678 Malignant neoplasm of overlapping sites of bladder: Secondary | ICD-10-CM | POA: Diagnosis not present

## 2018-05-30 DIAGNOSIS — M47816 Spondylosis without myelopathy or radiculopathy, lumbar region: Secondary | ICD-10-CM | POA: Diagnosis not present

## 2018-05-30 DIAGNOSIS — M40209 Unspecified kyphosis, site unspecified: Secondary | ICD-10-CM | POA: Diagnosis not present

## 2018-05-30 DIAGNOSIS — M419 Scoliosis, unspecified: Secondary | ICD-10-CM | POA: Diagnosis not present

## 2018-05-30 DIAGNOSIS — M791 Myalgia, unspecified site: Secondary | ICD-10-CM | POA: Diagnosis not present

## 2018-06-12 DIAGNOSIS — M859 Disorder of bone density and structure, unspecified: Secondary | ICD-10-CM | POA: Diagnosis not present

## 2018-06-12 DIAGNOSIS — I10 Essential (primary) hypertension: Secondary | ICD-10-CM | POA: Diagnosis not present

## 2018-06-12 DIAGNOSIS — R82998 Other abnormal findings in urine: Secondary | ICD-10-CM | POA: Diagnosis not present

## 2018-06-18 DIAGNOSIS — D638 Anemia in other chronic diseases classified elsewhere: Secondary | ICD-10-CM | POA: Diagnosis not present

## 2018-06-18 DIAGNOSIS — Z681 Body mass index (BMI) 19 or less, adult: Secondary | ICD-10-CM | POA: Diagnosis not present

## 2018-06-18 DIAGNOSIS — M419 Scoliosis, unspecified: Secondary | ICD-10-CM | POA: Diagnosis not present

## 2018-06-18 DIAGNOSIS — Z1389 Encounter for screening for other disorder: Secondary | ICD-10-CM | POA: Diagnosis not present

## 2018-06-18 DIAGNOSIS — I2510 Atherosclerotic heart disease of native coronary artery without angina pectoris: Secondary | ICD-10-CM | POA: Diagnosis not present

## 2018-06-18 DIAGNOSIS — Z8551 Personal history of malignant neoplasm of bladder: Secondary | ICD-10-CM | POA: Diagnosis not present

## 2018-06-18 DIAGNOSIS — G4733 Obstructive sleep apnea (adult) (pediatric): Secondary | ICD-10-CM | POA: Diagnosis not present

## 2018-06-18 DIAGNOSIS — N184 Chronic kidney disease, stage 4 (severe): Secondary | ICD-10-CM | POA: Diagnosis not present

## 2018-06-18 DIAGNOSIS — I491 Atrial premature depolarization: Secondary | ICD-10-CM | POA: Diagnosis not present

## 2018-06-18 DIAGNOSIS — M5136 Other intervertebral disc degeneration, lumbar region: Secondary | ICD-10-CM | POA: Diagnosis not present

## 2018-06-18 DIAGNOSIS — I129 Hypertensive chronic kidney disease with stage 1 through stage 4 chronic kidney disease, or unspecified chronic kidney disease: Secondary | ICD-10-CM | POA: Diagnosis not present

## 2018-06-18 DIAGNOSIS — Z0000 Encounter for general adult medical examination without abnormal findings: Secondary | ICD-10-CM | POA: Diagnosis not present

## 2018-07-07 ENCOUNTER — Other Ambulatory Visit: Payer: Self-pay | Admitting: Cardiology

## 2018-10-02 IMAGING — MR MR LUMBAR SPINE W/O CM
6 series · 38 of 48 positions shown · non-contrast
Comparison: CT abdomen and pelvis March 12, 2017 and MRI of the
lumbar spine September 11, 2005

CLINICAL DATA: RIGHT hip pain for 6 months, no injury. Follow-up
scoliosis.

EXAM:
MRI LUMBAR SPINE WITHOUT CONTRAST
TECHNIQUE: Multiplanar, multisequence MR imaging of the lumbar spine was
performed. No intravenous contrast was administered.

[Series 3: tirm sag · sagittal · 4.0mm · 0.55mm/px · 5 of 15 slices shown]
[im 1/15]
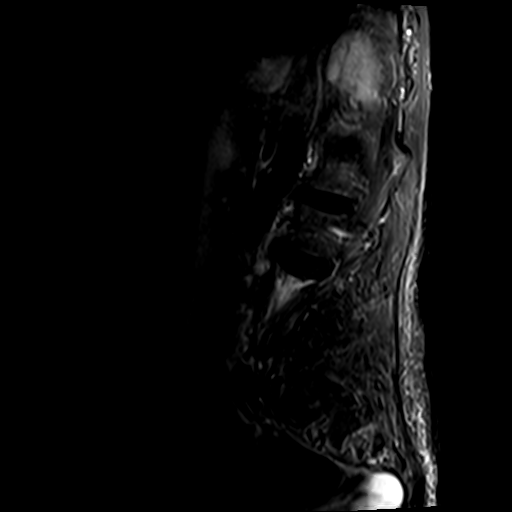
[im 3/15]
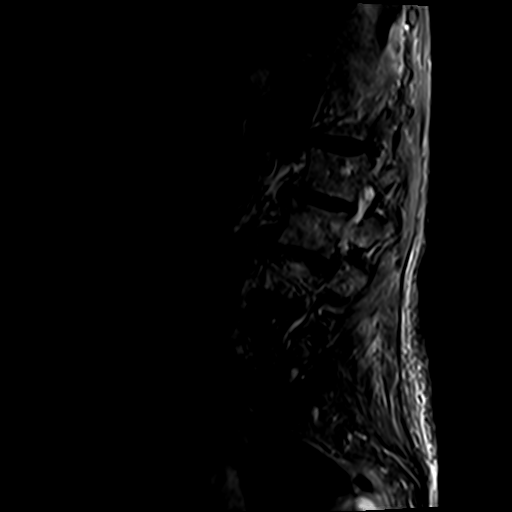
[im 6/15]
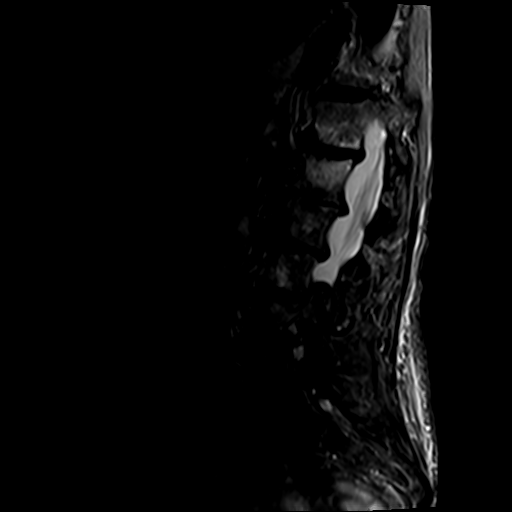
[im 9/15]
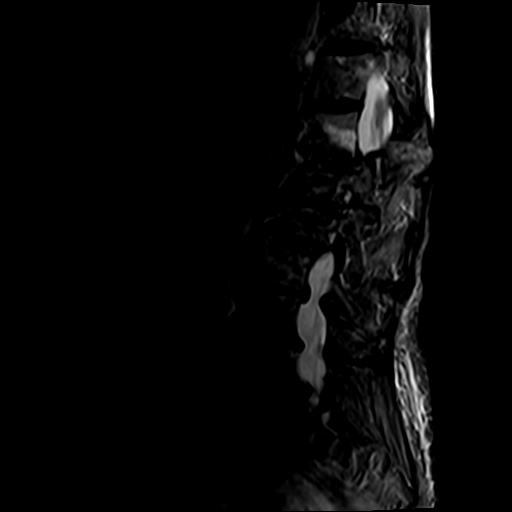
[im 12/15]
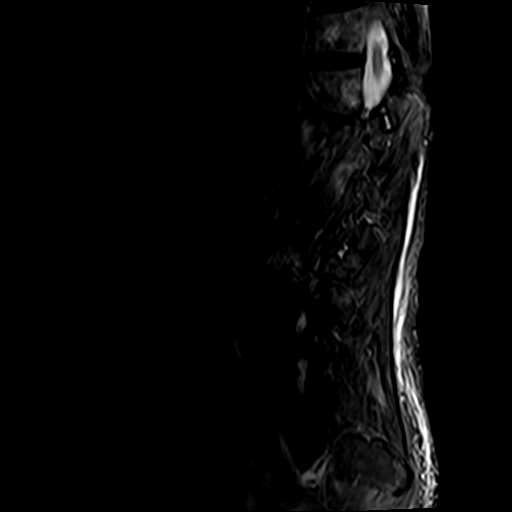

[Series 4: T2 · sagittal · 4.0mm · 0.88mm/px · 5 of 13 slices shown (1 of 3)]
[im 1/13]
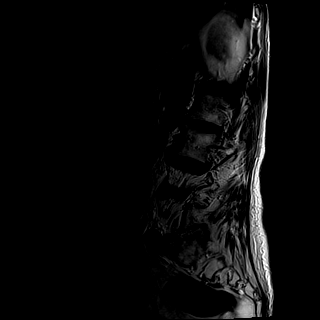
[im 4/13]
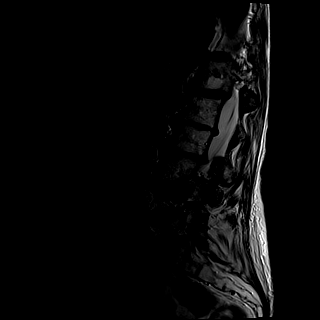
[im 7/13]
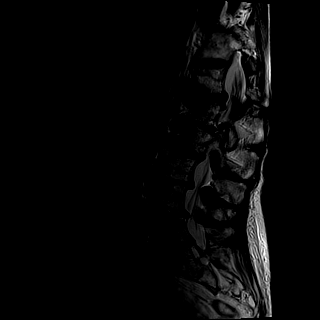
[im 10/13]
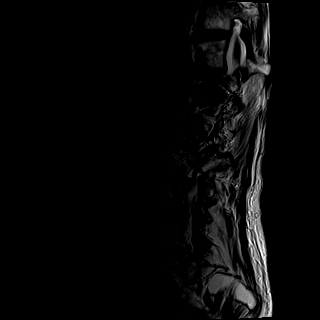
[im 13/13]
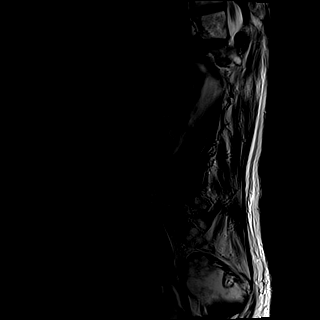

[Series 5: T1 · sagittal · 4.0mm · 0.88mm/px · 6 of 15 slices shown (1 of 2)]
[im 1/15]
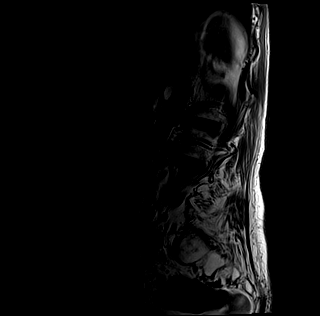
[im 3/15]
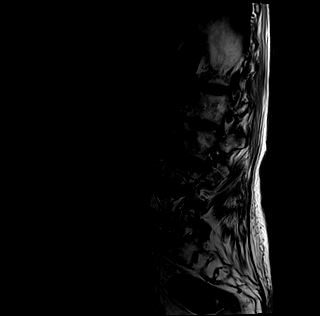
[im 6/15]
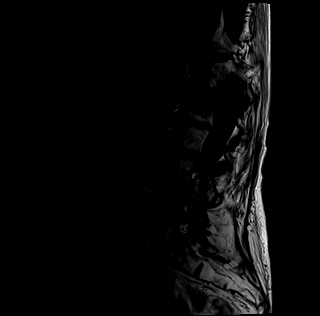
[im 9/15]
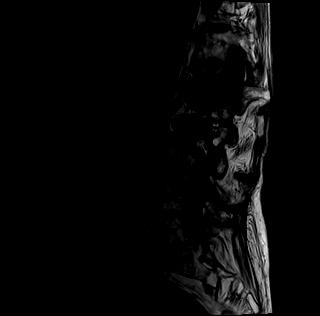
[im 12/15]
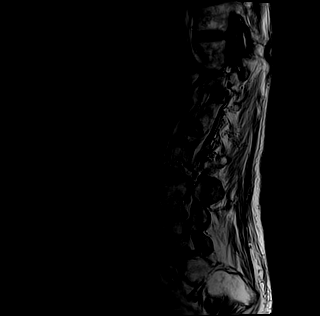
[im 15/15]
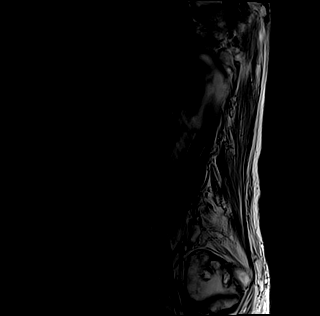

[Series 6: T1 · axial · 4.0mm · 0.78mm/px · z∈[-17,+186]mm · 8 of 33 slices shown (2 of 2)]
[im 1/33]
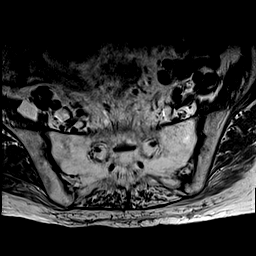
[im 6/33]
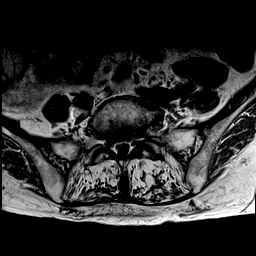
[im 11/33]
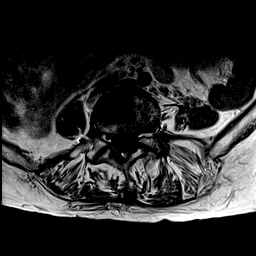
[im 14/33]
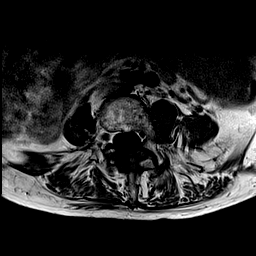
[im 19/33]
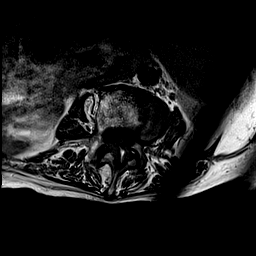
[im 22/33]
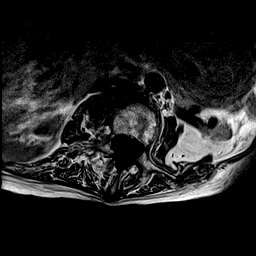
[im 27/33]
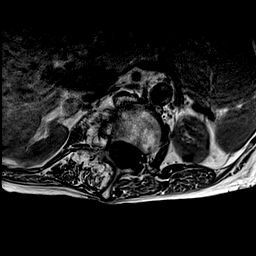
[im 33/33]
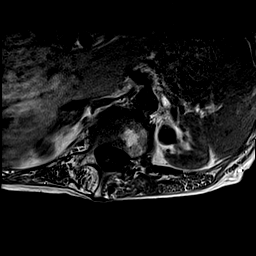

[Series 7: T2 · axial · 4.0mm · 0.78mm/px · z∈[-17,+186]mm · 9 of 33 slices shown (2 of 3)]
[im 1/33]
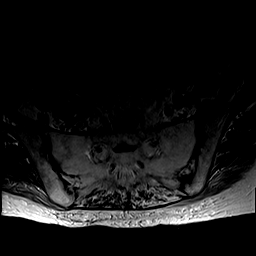
[im 6/33]
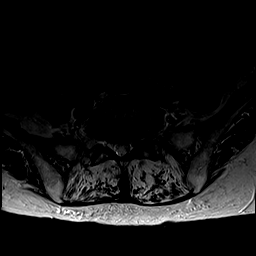
[im 11/33]
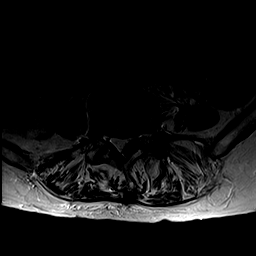
[im 14/33]
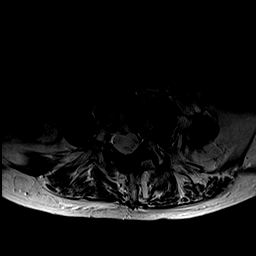
[im 17/33]
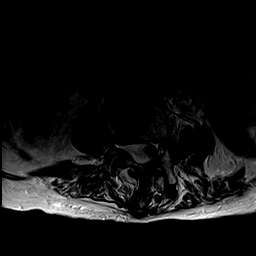
[im 19/33]
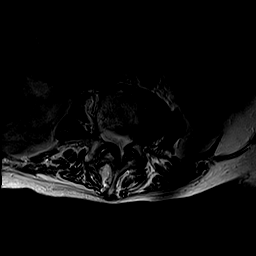
[im 22/33]
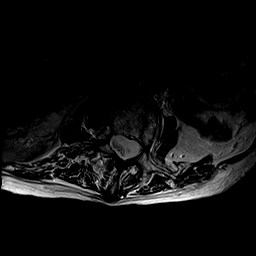
[im 27/33]
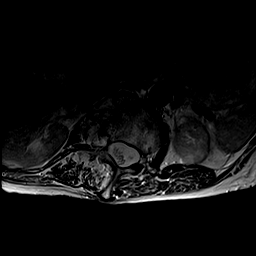
[im 33/33]
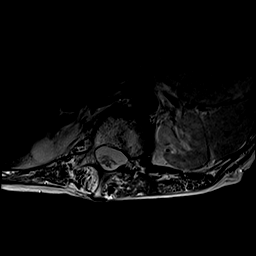

[Series 8: T2 · coronal · 4.0mm · 0.88mm/px · 5 of 13 slices shown (3 of 3)]
[im 1/13]
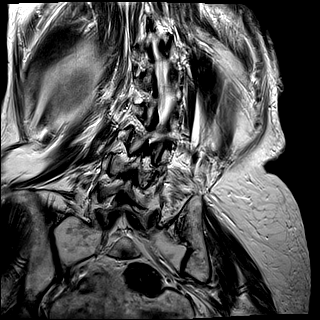
[im 4/13]
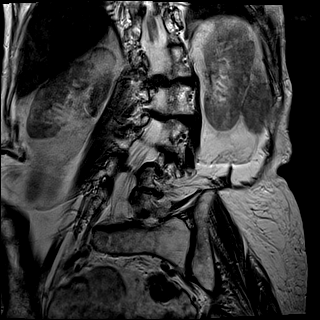
[im 7/13]
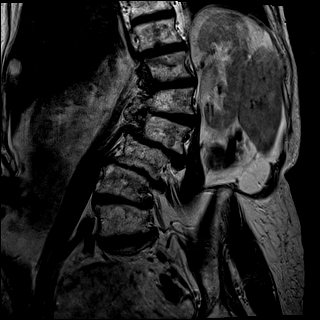
[im 10/13]
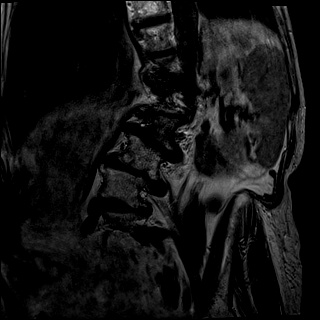
[im 13/13]
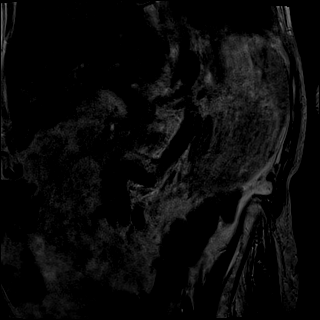

[38 of 48 positions shown; findings below may reference images not displayed]

FINDINGS: SEGMENTATION: For the purposes of this report, the last well-formed
intervertebral disc will be described as L5-S1.

ALIGNMENT: Maintenance of the lumbar lordosis. No malalignment.
Severe, progressed thoracolumbar levoscoliosis on this
nonweightbearing examination.

VERTEBRAE:Vertebral bodies are intact. Moderate to severe disc
height loss all levels associated with a scoliosis, with decreased
T2 signal compatible with desiccation. Severe acute discogenic
endplate changes toward the RIGHT T12-L1, and mild toward the LEFT
at L3-4. Moderate chronic discogenic endplate changes all lumbar
levels. Generally bright T1 and T2 bone marrow signal compatible
with osteopenia.

CONUS MEDULLARIS: Conus medullaris terminates at T12-L1 and
demonstrates normal morphology and signal characteristics. Cauda
equina is normal.

PARASPINAL AND SOFT TISSUES: Included prevertebral and paraspinal
soft tissues are nonacute. T2 bright partially imaged cysts RIGHT
kidney were present previously. Mild symmetric paraspinal muscle
atrophy. Colonic diverticulosis better characterized on prior CT.

DISC LEVELS:

T12-L1: Moderate broad-based disc bulge asymmetric to the RIGHT.
Superimposed contiguous 4 mm RIGHT subarticular disc extrusion. Mild
facet arthropathy and ligamentum flavum redundancy. Mild canal
stenosis with partially effaced RIGHT lateral recess which may
affect the traversing RIGHT L1 nerve. Moderate RIGHT neural
foraminal narrowing.

L1-2: Moderate broad-based disc bulge, mild facet arthropathy and
ligamentum flavum redundancy. Mild canal stenosis. Mild to moderate
RIGHT neural foraminal narrowing.

L2-3: Moderate broad-based disc bulge, moderate facet arthropathy
and ligamentum flavum redundancy. Mild to moderate canal stenosis.
Mild to moderate bilateral neural foraminal narrowing.

L3-4: Moderate broad-based disc bulge, moderate facet arthropathy
and ligamentum flavum redundancy. Moderate to severe canal stenosis.
LEFT lateral recess effacement may affect the traversing LEFT L4
nerve. Mild RIGHT, moderate to severe LEFT neural foraminal
narrowing.

L4-5: Small to moderate broad-based disc bulge, mild RIGHT and
moderate LEFT facet arthropathy and ligamentum flavum redundancy.
Moderate canal stenosis with partial effacement LEFT lateral recess
which may affect the traversing LEFT L5 nerve. Mild to moderate LEFT
neural foraminal narrowing.

L5-S1: Small to moderate broad-based disc bulge asymmetric to the
LEFT may affect the traversing LEFT S1 nerve within the lateral
recess. Mild RIGHT, moderate to severe LEFT facet arthropathy with
ligamentum flavum redundancy. No canal stenosis. Mild LEFT neural
foraminal narrowing.
IMPRESSION: Degenerative lumbar spine. Severe thoracolumbar levoscoliosis as
seen on prior CT, progressed from 3005.

Moderate to severe canal stenosis L3-4, moderate at L4-5 and mild to
moderate at L2-3.

Neural foraminal narrowing at all levels: Moderate to severe on the
LEFT at L3-4.

Small contiguous T12-L1 RIGHT subarticular disc extrusion.

## 2018-10-14 DIAGNOSIS — N390 Urinary tract infection, site not specified: Secondary | ICD-10-CM | POA: Diagnosis not present

## 2018-10-14 DIAGNOSIS — R319 Hematuria, unspecified: Secondary | ICD-10-CM | POA: Diagnosis not present

## 2018-10-14 DIAGNOSIS — R350 Frequency of micturition: Secondary | ICD-10-CM | POA: Diagnosis not present

## 2018-10-27 DIAGNOSIS — R350 Frequency of micturition: Secondary | ICD-10-CM | POA: Diagnosis not present

## 2018-11-19 ENCOUNTER — Other Ambulatory Visit: Payer: Self-pay | Admitting: Cardiology

## 2018-12-03 DIAGNOSIS — Z23 Encounter for immunization: Secondary | ICD-10-CM | POA: Diagnosis not present

## 2018-12-11 ENCOUNTER — Other Ambulatory Visit: Payer: Self-pay | Admitting: Cardiology

## 2018-12-24 DIAGNOSIS — N183 Chronic kidney disease, stage 3 (moderate): Secondary | ICD-10-CM | POA: Diagnosis not present

## 2018-12-24 DIAGNOSIS — M859 Disorder of bone density and structure, unspecified: Secondary | ICD-10-CM | POA: Diagnosis not present

## 2018-12-24 DIAGNOSIS — I509 Heart failure, unspecified: Secondary | ICD-10-CM | POA: Diagnosis not present

## 2018-12-24 DIAGNOSIS — I10 Essential (primary) hypertension: Secondary | ICD-10-CM | POA: Diagnosis not present

## 2018-12-24 DIAGNOSIS — I129 Hypertensive chronic kidney disease with stage 1 through stage 4 chronic kidney disease, or unspecified chronic kidney disease: Secondary | ICD-10-CM | POA: Diagnosis not present

## 2018-12-24 DIAGNOSIS — I2510 Atherosclerotic heart disease of native coronary artery without angina pectoris: Secondary | ICD-10-CM | POA: Diagnosis not present

## 2018-12-24 DIAGNOSIS — M5136 Other intervertebral disc degeneration, lumbar region: Secondary | ICD-10-CM | POA: Diagnosis not present

## 2018-12-24 DIAGNOSIS — Z1389 Encounter for screening for other disorder: Secondary | ICD-10-CM | POA: Diagnosis not present

## 2018-12-24 DIAGNOSIS — G4733 Obstructive sleep apnea (adult) (pediatric): Secondary | ICD-10-CM | POA: Diagnosis not present

## 2018-12-24 DIAGNOSIS — I491 Atrial premature depolarization: Secondary | ICD-10-CM | POA: Diagnosis not present

## 2018-12-24 DIAGNOSIS — D638 Anemia in other chronic diseases classified elsewhere: Secondary | ICD-10-CM | POA: Diagnosis not present

## 2018-12-24 DIAGNOSIS — E7849 Other hyperlipidemia: Secondary | ICD-10-CM | POA: Diagnosis not present

## 2018-12-29 DIAGNOSIS — N3000 Acute cystitis without hematuria: Secondary | ICD-10-CM | POA: Diagnosis not present

## 2019-01-11 ENCOUNTER — Other Ambulatory Visit: Payer: Self-pay | Admitting: Cardiology

## 2019-01-16 DIAGNOSIS — B9620 Unspecified Escherichia coli [E. coli] as the cause of diseases classified elsewhere: Secondary | ICD-10-CM | POA: Diagnosis not present

## 2019-01-16 DIAGNOSIS — N390 Urinary tract infection, site not specified: Secondary | ICD-10-CM | POA: Diagnosis not present

## 2019-02-02 ENCOUNTER — Encounter: Payer: Self-pay | Admitting: Physician Assistant

## 2019-02-02 ENCOUNTER — Ambulatory Visit (INDEPENDENT_AMBULATORY_CARE_PROVIDER_SITE_OTHER): Payer: Medicare Other | Admitting: Physician Assistant

## 2019-02-02 VITALS — BP 106/52 | HR 70 | Ht 62.0 in | Wt 112.0 lb

## 2019-02-02 DIAGNOSIS — N183 Chronic kidney disease, stage 3 unspecified: Secondary | ICD-10-CM

## 2019-02-02 DIAGNOSIS — E7800 Pure hypercholesterolemia, unspecified: Secondary | ICD-10-CM

## 2019-02-02 DIAGNOSIS — G4733 Obstructive sleep apnea (adult) (pediatric): Secondary | ICD-10-CM

## 2019-02-02 DIAGNOSIS — I493 Ventricular premature depolarization: Secondary | ICD-10-CM | POA: Diagnosis not present

## 2019-02-02 DIAGNOSIS — I428 Other cardiomyopathies: Secondary | ICD-10-CM

## 2019-02-02 DIAGNOSIS — I6523 Occlusion and stenosis of bilateral carotid arteries: Secondary | ICD-10-CM | POA: Diagnosis not present

## 2019-02-02 DIAGNOSIS — I2510 Atherosclerotic heart disease of native coronary artery without angina pectoris: Secondary | ICD-10-CM

## 2019-02-02 DIAGNOSIS — R600 Localized edema: Secondary | ICD-10-CM

## 2019-02-02 MED ORDER — RAMIPRIL 5 MG PO CAPS: ORAL_CAPSULE | ORAL | 1 refills | Status: DC

## 2019-02-02 MED ORDER — ATORVASTATIN CALCIUM 20 MG PO TABS: 20.0000 mg | ORAL_TABLET | Freq: Every day | ORAL | 1 refills | Status: DC

## 2019-02-02 NOTE — Progress Notes (Signed)
Cardiology Office Note    Date:  02/04/2019   ID:  Paige Cook, DOB 22-Jun-1928, MRN [id number]  PCP:  Burnard Bunting, MD  Cardiologist:  Dr. Martinique   Chief Complaint  Patient presents with  . Follow-up    12 months.    History of Present Illness:  Paige Cook is a 85 y.o. female with PMH of CAD, carotid artery disease, NICM, CKD stage III, bladder cancer, obstructive sleep apnea and hyperlipidemia.  Cardiac catheterization performed on 01/10/2010 showed 40 to 50% proximal LAD stenosis, 30% mid LAD stenosis, EF 30%.  Otherwise normal left circumflex and RCA.  She had a history of very frequent PACs on the previous heart monitor.  Echocardiogram obtained in June 2012 showed EF 40%.  Last echocardiogram obtained on 07/31/2012 showed EF 45%, mild aortic regurgitation, mild MR, moderate TR.  Last carotid Doppler obtained in October 2014 showed 0 to 39% disease bilaterally.  Patient was last seen by Dr. Martinique on 01/02/2018 at which time she was doing well.   Patient presents today for follow-up along with her daughter.  She has 2 flights of stairs at home and denies any recent exertional chest pain or shortness of breath when climbing up and down the stairs.  Her EKG is unchanged and continue to show T wave inversion in lead V6.  Otherwise, I will refill her ramipril.  She does have at least 1+ pitting edema in bilateral lower extremity, the left lower extremity seems to little bit worse than the right lower extremity.  I advised her to continue on the current dose of Lasix and use leg elevation to help control the lower extremity edema.  Her last lipid panel from last year showed LDL of 141.  She apparently has not taken her Lipitor for over a year now.  I will restart Lipitor 20 mg daily and obtain fasting lipid panel and LFT in 87-month.  Past Medical History:  Diagnosis Date  . Anemia   . Arthritis   . Bladder cancer ([place])    giant tumor  . CAD (coronary artery disease)   . Cardiac  arrhythmia    benign  . Carotid bruit    left  . CHF (congestive heart failure) (HCC)    CHF with systolic dysfuntion ; dilated nonishemic cardiomyopathy  . Colon cancer ([place]) 1989  . Colon polyps   . Diverticulosis   . Epistaxis   . Female bladder prolapse   . Gallstones   . Hypercholesteremia   . Hyponatremia    Probably secondary to her CHF  . OSA (obstructive sleep apnea) 04/29/2012  . SOB (shortness of breath)    Sometimes at night    Past Surgical History:  Procedure Laterality Date  . ABDOMINAL HYSTERECTOMY    . BLADDER SURGERY  2013   cancer removed  . bladder tact    . CARDIAC CATHETERIZATION     non obstruction CAD  . CHOLECYSTECTOMY    . 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 COLONOSCOPY      Current Medications: Outpatient Medications Prior to Visit  Medication Sig Dispense Refill  . aspirin 81 MG chewable tablet Chew 81 mg by mouth daily with breakfast.    . calcium-vitamin D (OSCAL WITH D) 500-200 MG-UNIT per tablet Take 1 tablet by mouth every evening.     . carvedilol (COREG) 6.25 MG tablet TAKE 1 TABLET BY MOUTH TWICE A DAY WITH A MEAL. 180 tablet 2  . furosemide (  LASIX) 40 MG tablet TAKE 1 TABLET BY MOUTH EVERY DAY 30 tablet 0  . glucosamine-chondroitin 500-400 MG tablet Take 1 tablet by mouth daily with breakfast.     . polyethylene glycol (MIRALAX / GLYCOLAX) packet Take 17 g by mouth daily. 14 each 0  . vitamin C (ASCORBIC ACID) 250 MG tablet Take 250 mg by mouth daily with breakfast.    . atorvastatin (LIPITOR) 20 MG tablet Take 1 tablet (20 mg total) by mouth daily. 30 tablet 6  . ramipril (ALTACE) 5 MG capsule TAKE 1 CAPSULE BY MOUTH EVERY DAY 90 capsule 0   No facility-administered medications prior to visit.      Allergies:   Penicillins and Tramadol   Social History   Socioeconomic History  . Marital status: Married    Spouse name: Not on file  . Number of children: 4  . Years of education: Not on file  . Highest education level: Not  on file  Occupational History  . Occupation: retired Paramedic  Social Needs  . Financial resource strain: Not on file  . Food insecurity:    Worry: Not on file    Inability: Not on file  . Transportation needs:    Medical: Not on file    Non-medical: Not on file  Tobacco Use  . Smoking status: Never Smoker  . Smokeless tobacco: Never Used  Substance and Sexual Activity  . Alcohol use: No  . Drug use: No  . Sexual activity: Not Currently  Lifestyle  . Physical activity:    Days per week: Not on file    Minutes per session: Not on file  . Stress: Not on file  Relationships  . Social connections:    Talks on phone: Not on file    Gets together: Not on file    Attends religious service: Not on file    Active member of club or organization: Not on file    Attends meetings of clubs or organizations: Not on file    Relationship status: Not on file  Other Topics Concern  . Not on file  Social History Narrative   Married, 1 son and 3 daughters.    One coffee a day no alcohol no tobacco   05/05/2015     Family History:  The patient's family history includes Heart attack in her father and mother; Heart disease in her father; Heart failure (age of onset: 11) in her sister.   ROS:   Please see the history of present illness.    ROS All other systems reviewed and are negative.   PHYSICAL EXAM:   VS:  BP (!) 106/52 (BP Location: Left Arm, Patient Position: Sitting, Cuff Size: Normal)   Pulse 70   Ht 5\' 2"  (1.575 m)   Wt 112 lb (50.8 kg)   BMI 20.49 kg/m    GEN: Well nourished, well developed, in no acute distress  HEENT: normal  Neck: no JVD, carotid bruits, or masses Cardiac: RRR; no murmurs, rubs, or gallops. 1+ edema  Respiratory:  clear to auscultation bilaterally, normal work of breathing GI: soft, nontender, nondistended, + BS MS: no deformity or atrophy  Skin: warm and dry, no rash Neuro:  Alert and Oriented x 3, Strength and sensation are  intact Psych: euthymic mood, full affect  Wt Readings from Last 3 Encounters:  02/02/19 112 lb (50.8 kg)  01/02/18 110 lb 9.6 oz (50.2 kg)  03/12/17 113 lb (51.3 kg)      Studies/Labs Reviewed:  EKG:  EKG is ordered today.  The ekg ordered today demonstrates normal sinus rhythm, nonspecific T wave changes  Recent Labs: No results found for requested labs within last 8760 hours.   Lipid Panel    Component Value Date/Time   CHOL 149 11/03/2015 1042   TRIG 74 11/03/2015 1042   HDL 68 11/03/2015 1042   CHOLHDL 2.2 11/03/2015 1042   VLDL 15 11/03/2015 1042   LDLCALC 66 11/03/2015 1042   LDLDIRECT 152.9 03/06/2013 0924    Additional studies/ records that were reviewed today include:   Cath 01/10/2010   Echo 07/31/2012 Study Conclusions  - Left ventricle: Hypokinesis at the base inferior and base inferolateral segments. The EF is 45%. Difficult to compare to the 2012 study. I think the LV may be a little better. The cavity size was normal. Wall thickness was normal. - Aortic valve: Sclerosis without stenosis. Mild regurgitation. - Ascending aorta: The ascending aorta was normal in size. - Mitral valve: Mild prolapse posterior leaflet. Mild MR. - Tricuspid valve: Moderate regurgitation.    ASSESSMENT:    1. Coronary artery disease involving native coronary artery of native heart without angina pectoris   2. Hypercholesteremia   3. PVC's (premature ventricular contractions)   4. Bilateral carotid artery stenosis   5. NICM (nonischemic cardiomyopathy) ([place])   6. CKD (chronic kidney disease), stage III ([place])   7. OSA (obstructive sleep apnea)   8. Leg edema      PLAN:  In order of problems listed above:  1. CAD: Previous cardiac catheterization showed nonobstructive disease.  On aspirin..  2. Hyperlipidemia: Restart Lipitor 20 mg daily.  Fasting lipid panel and LFT in 53-month  3. Bilateral carotid artery disease: Last carotid Doppler obtained on  10/16/2013 showed 1 to 39% bilateral ICA stenosis, chronic left subclavian steal.  Consider repeat carotid Doppler on next visit.  4. Nonischemic cardiomyopathy: Last echocardiogram showed EF 45%.  Continue carvedilol and ramipril.  5. Leg edema: Continue to current dose of Lasix, manage lower extremity edema with leg elevation    Medication Adjustments/Labs and Tests Ordered: Current medicines are reviewed at length with the patient today.  Concerns regarding medicines are outlined above.  Medication changes, Labs and Tests ordered today are listed in the Patient Instructions below. Patient Instructions  Medication Instructions:   Restart Lipitor 20 MG Daily   If you need a refill on your cardiac medications before your next appointment, please call your pharmacy.   Lab work: you will need to return to our office in 2 months for the following labs    Liver function  Lipid test  If you have labs (blood work) drawn today and your tests are completely normal, you will receive your results only by: Marland Kitchen MyChart Message (if you have MyChart) OR . A paper copy in the mail If you have any lab test that is abnormal or we need to change your treatment, we will call you to review the results.  Testing/Procedures:  NONE  Follow-Up: At [facility], you and your health needs are our priority.  As part of our continuing mission to provide you with exceptional heart care, we have created designated Provider Care Teams.  These Care Teams include your primary Cardiologist (physician) and Advanced Practice Providers (APPs -  Physician Assistants and Nurse Practitioners) who all work together to provide you with the care you need, when you need it. You will need a follow up appointment in 12 months.  Please call our office  2 months in advance to schedule this appointment.  You may see Peter Martinique, MD or one of the following Advanced Practice Providers on your designated Care Team: [place],  [place] . Fabian Sharp, PA-C  Any Other Special Instructions Will Be Listed Below (If Applicable).       Hilbert Corrigan, [place]  02/04/2019 11:42 PM    [facility] [place], [place],   [address] Phone: [phone]; Fax: [phone]

## 2019-02-02 NOTE — Patient Instructions (Signed)
Medication Instructions:   Restart Lipitor 20 MG Daily   If you need a refill on your cardiac medications before your next appointment, please call your pharmacy.   Lab work: you will need to return to our office in 2 months for the following labs    Liver function  Lipid test  If you have labs (blood work) drawn today and your tests are completely normal, you will receive your results only by: Marland Kitchen MyChart Message (if you have MyChart) OR . A paper copy in the mail If you have any lab test that is abnormal or we need to change your treatment, we will call you to review the results.  Testing/Procedures:  NONE  Follow-Up: At [facility], you and your health needs are our priority.  As part of our continuing mission to provide you with exceptional heart care, we have created designated Provider Care Teams.  These Care Teams include your primary Cardiologist (physician) and Advanced Practice Providers (APPs -  Physician Assistants and Nurse Practitioners) who all work together to provide you with the care you need, when you need it. You will need a follow up appointment in 12 months.  Please call our office 2 months in advance to schedule this appointment.  You may see Peter Martinique, MD or one of the following Advanced Practice Providers on your designated Care Team: [place], [place] . Fabian Sharp, PA-C  Any Other Special Instructions Will Be Listed Below (If Applicable).

## 2019-02-04 ENCOUNTER — Encounter: Payer: Self-pay | Admitting: Physician Assistant

## 2019-02-11 ENCOUNTER — Other Ambulatory Visit: Payer: Self-pay | Admitting: Cardiology

## 2019-04-16 ENCOUNTER — Other Ambulatory Visit: Payer: Self-pay

## 2019-04-16 MED ORDER — CARVEDILOL 6.25 MG PO TABS: ORAL_TABLET | ORAL | 2 refills | Status: DC

## 2019-06-22 DIAGNOSIS — I10 Essential (primary) hypertension: Secondary | ICD-10-CM | POA: Diagnosis not present

## 2019-06-22 DIAGNOSIS — G4733 Obstructive sleep apnea (adult) (pediatric): Secondary | ICD-10-CM | POA: Diagnosis not present

## 2019-06-22 DIAGNOSIS — E785 Hyperlipidemia, unspecified: Secondary | ICD-10-CM | POA: Diagnosis not present

## 2019-06-22 DIAGNOSIS — D638 Anemia in other chronic diseases classified elsewhere: Secondary | ICD-10-CM | POA: Diagnosis not present

## 2019-06-22 DIAGNOSIS — I129 Hypertensive chronic kidney disease with stage 1 through stage 4 chronic kidney disease, or unspecified chronic kidney disease: Secondary | ICD-10-CM | POA: Diagnosis not present

## 2019-06-22 DIAGNOSIS — I491 Atrial premature depolarization: Secondary | ICD-10-CM | POA: Diagnosis not present

## 2019-06-22 DIAGNOSIS — Z0000 Encounter for general adult medical examination without abnormal findings: Secondary | ICD-10-CM | POA: Diagnosis not present

## 2019-06-22 DIAGNOSIS — N183 Chronic kidney disease, stage 3 (moderate): Secondary | ICD-10-CM | POA: Diagnosis not present

## 2019-06-22 DIAGNOSIS — I509 Heart failure, unspecified: Secondary | ICD-10-CM | POA: Diagnosis not present

## 2019-06-22 DIAGNOSIS — M8580 Other specified disorders of bone density and structure, unspecified site: Secondary | ICD-10-CM | POA: Diagnosis not present

## 2019-06-22 DIAGNOSIS — M5136 Other intervertebral disc degeneration, lumbar region: Secondary | ICD-10-CM | POA: Diagnosis not present

## 2019-06-22 DIAGNOSIS — E7849 Other hyperlipidemia: Secondary | ICD-10-CM | POA: Diagnosis not present

## 2019-06-22 DIAGNOSIS — I2510 Atherosclerotic heart disease of native coronary artery without angina pectoris: Secondary | ICD-10-CM | POA: Diagnosis not present

## 2019-06-22 DIAGNOSIS — M859 Disorder of bone density and structure, unspecified: Secondary | ICD-10-CM | POA: Diagnosis not present

## 2019-07-15 DIAGNOSIS — C678 Malignant neoplasm of overlapping sites of bladder: Secondary | ICD-10-CM | POA: Diagnosis not present

## 2019-07-16 DIAGNOSIS — R82998 Other abnormal findings in urine: Secondary | ICD-10-CM | POA: Diagnosis not present

## 2019-07-16 DIAGNOSIS — I10 Essential (primary) hypertension: Secondary | ICD-10-CM | POA: Diagnosis not present

## 2019-07-23 ENCOUNTER — Telehealth: Payer: Self-pay | Admitting: Cardiology

## 2019-07-23 ENCOUNTER — Encounter: Payer: Self-pay | Admitting: Cardiology

## 2019-07-23 ENCOUNTER — Ambulatory Visit (INDEPENDENT_AMBULATORY_CARE_PROVIDER_SITE_OTHER): Payer: Medicare Other | Admitting: Cardiology

## 2019-07-23 ENCOUNTER — Other Ambulatory Visit: Payer: Self-pay

## 2019-07-23 VITALS — BP 126/67 | HR 96 | Ht 62.0 in | Wt 115.0 lb

## 2019-07-23 DIAGNOSIS — N183 Chronic kidney disease, stage 3 unspecified: Secondary | ICD-10-CM

## 2019-07-23 DIAGNOSIS — I6523 Occlusion and stenosis of bilateral carotid arteries: Secondary | ICD-10-CM | POA: Diagnosis not present

## 2019-07-23 DIAGNOSIS — I2510 Atherosclerotic heart disease of native coronary artery without angina pectoris: Secondary | ICD-10-CM | POA: Diagnosis not present

## 2019-07-23 DIAGNOSIS — I10 Essential (primary) hypertension: Secondary | ICD-10-CM | POA: Diagnosis not present

## 2019-07-23 DIAGNOSIS — G4733 Obstructive sleep apnea (adult) (pediatric): Secondary | ICD-10-CM | POA: Diagnosis not present

## 2019-07-23 DIAGNOSIS — I428 Other cardiomyopathies: Secondary | ICD-10-CM

## 2019-07-23 DIAGNOSIS — E7800 Pure hypercholesterolemia, unspecified: Secondary | ICD-10-CM

## 2019-07-23 DIAGNOSIS — R0601 Orthopnea: Secondary | ICD-10-CM | POA: Diagnosis not present

## 2019-07-23 MED ORDER — FUROSEMIDE 40 MG PO TABS: 40.0000 mg | ORAL_TABLET | Freq: Every day | ORAL | 1 refills | Status: DC

## 2019-07-23 MED ORDER — RAMIPRIL 5 MG PO CAPS: ORAL_CAPSULE | ORAL | 1 refills | Status: DC

## 2019-07-23 NOTE — Progress Notes (Signed)
Cardiology Office Note:    Date:  07/23/2019   ID:  Paige Cook, DOB May 20, 1928, MRN [id number]  PCP:  Burnard Bunting, MD  Cardiologist:  Peter Martinique, MD  Electrophysiologist:  None   Referring MD: Burnard Bunting, MD   SOB at night  History of Present Illness:    Paige Cook is a 85 y.o. female with a hx of moderate CAD at cath 2011 and NICM.  Her EF was 30% in 2011, 40% 2012, 45% 2013.  Other problems include CRI-3, OSA (not compliant with C-pap), and dyslipidemia.    She presents today accompanied by her daughter.  The patient has noted SOB when she lays down at night.  This has been for the last two nights.  Her symptoms improve when she gets up and walks around.  She tells me she is on 20 mg of Lasix and Altace 5 mg 3 x week. She is not compliant with c-pap.   Past Medical History:  Diagnosis Date  . Anemia   . Arthritis   . Bladder cancer ([place])    giant tumor  . CAD (coronary artery disease)   . Cardiac arrhythmia    benign  . Carotid bruit    left  . CHF (congestive heart failure) (HCC)    CHF with systolic dysfuntion ; dilated nonishemic cardiomyopathy  . Colon cancer ([place]) 1989  . Colon polyps   . Diverticulosis   . Epistaxis   . Female bladder prolapse   . Gallstones   . Hypercholesteremia   . Hyponatremia    Probably secondary to her CHF  . OSA (obstructive sleep apnea) 04/29/2012  . SOB (shortness of breath)    Sometimes at night    Past Surgical History:  Procedure Laterality Date  . ABDOMINAL HYSTERECTOMY    . BLADDER SURGERY  2013   cancer removed  . bladder tact    . CARDIAC CATHETERIZATION     non obstruction CAD  . CHOLECYSTECTOMY    . 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 COLONOSCOPY      Current Medications: Current Meds  Medication Sig  . aspirin 81 MG chewable tablet Chew 81 mg by mouth daily with breakfast.  . calcium-vitamin D (OSCAL WITH D) 500-200 MG-UNIT per tablet Take 1 tablet by mouth every evening.   . carvedilol  (COREG) 6.25 MG tablet TAKE 1 TABLET BY MOUTH TWICE A DAY WITH A MEAL.  Marland Kitchen Cyanocobalamin (B-12 PO) Take by mouth.  . furosemide (LASIX) 40 MG tablet TAKE 1 TABLET BY MOUTH EVERY DAY  . glucosamine-chondroitin 500-400 MG tablet Take 1 tablet by mouth daily with breakfast.   . polyethylene glycol (MIRALAX / GLYCOLAX) packet Take 17 g by mouth daily.  . ramipril (ALTACE) 5 MG capsule TAKE 1 CAPSULE BY MOUTH EVERY DAY  . vitamin C (ASCORBIC ACID) 250 MG tablet Take 250 mg by mouth daily with breakfast.  . [DISCONTINUED] atorvastatin (LIPITOR) 20 MG tablet Take 1 tablet (20 mg total) by mouth daily.     Allergies:   Penicillins and Tramadol   Social History   Socioeconomic History  . Marital status: Married    Spouse name: Not on file  . Number of children: 4  . Years of education: Not on file  . Highest education level: Not on file  Occupational History  . Occupation: retired Paramedic  Social Needs  . Financial resource strain: Not on file  . Food insecurity    Worry:  Not on file    Inability: Not on file  . Transportation needs    Medical: Not on file    Non-medical: Not on file  Tobacco Use  . Smoking status: Never Smoker  . Smokeless tobacco: Never Used  Substance and Sexual Activity  . Alcohol use: No  . Drug use: No  . Sexual activity: Not Currently  Lifestyle  . Physical activity    Days per week: Not on file    Minutes per session: Not on file  . Stress: Not on file  Relationships  . Social Herbalist on phone: Not on file    Gets together: Not on file    Attends religious service: Not on file    Active member of club or organization: Not on file    Attends meetings of clubs or organizations: Not on file    Relationship status: Not on file  Other Topics Concern  . Not on file  Social History Narrative   Married, 1 son and 3 daughters.    One coffee a day no alcohol no tobacco   05/05/2015     Family History: The patient's family  history includes Heart attack in her father and mother; Heart disease in her father; Heart failure (age of onset: 37) in her sister. There is no history of Stroke.  ROS:   Please see the history of present illness.    H/O bladder cancer - just cleared by her oncologist at [facility].   All other systems reviewed and are negative.  EKGs/Labs/Other Studies Reviewed:    The following studies were reviewed today: Cath from '07 Echo 2013  EKG:  EKG is ordered today.  The ekg ordered today demonstrates NSR, HR 93, poor anterior RW, NSST changes -similar to prior EKG though mor prominent.   Recent Labs: No results found for requested labs within last 8760 hours.  Recent Lipid Panel    Component Value Date/Time   CHOL 149 11/03/2015 1042   TRIG 74 11/03/2015 1042   HDL 68 11/03/2015 1042   CHOLHDL 2.2 11/03/2015 1042   VLDL 15 11/03/2015 1042   LDLCALC 66 11/03/2015 1042   LDLDIRECT 152.9 03/06/2013 0924    Physical Exam:    VS:  BP 126/67   Pulse 96   Ht 5\' 2"  (1.575 m)   Wt 115 lb (52.2 kg)   SpO2 93%   BMI 21.03 kg/m     Wt Readings from Last 3 Encounters:  07/23/19 115 lb (52.2 kg)  02/02/19 112 lb (50.8 kg)  01/02/18 110 lb 9.6 oz (50.2 kg)     GEN: Elderly, frail female, in no acute distress HEENT: Normal NECK: JVP noted LYMPHATICS: No lymphadenopathy CARDIAC: RRR, no murmurs, rubs, gallops RESPIRATORY:  Kyphoscoliosis, decreased breath sounds  ABDOMEN: Soft, non-tender, non-distended MUSCULOSKELETAL:  No edema; No deformity  SKIN: Warm and dry NEUROLOGIC:  Alert and oriented x 3 PSYCHIATRIC:  Normal affect   ASSESSMENT:    Orthopnea Pt seen today with history c/w orthopnea the last two nights.  NICM (nonischemic cardiomyopathy) ([place]) EF 30% 2011, 40% 2012, 45% 2013 Check echo  CAD (coronary artery disease) Moderate CAD at cath 2011  CKD (chronic kidney disease), stage III ([place]) GFR 31 in 2018-recent labs done by PCP July 2020- will obtain  OSA  (obstructive sleep apnea) I suggested she resume C-pap  PLAN:    Increase Lasix to 40 mg daily.  Obtain labs from PCP.  Check echo for LVF.  I have asked her to weigh herself daily.    Medication Adjustments/Labs and Tests Ordered: Current medicines are reviewed at length with the patient today.  Concerns regarding medicines are outlined above.  No orders of the defined types were placed in this encounter.  No orders of the defined types were placed in this encounter.   There are no Patient Instructions on file for this visit.   Angelena Form, PA-C  07/23/2019 10:46 AM    [facility]

## 2019-07-23 NOTE — Telephone Encounter (Signed)
Call placed to the patient. She stated that for the past two nights she has had shortness of breath when lying down that is only relieved when she elevates herself. The shortness of breath only occurs when she is lying down.  She has not been using her CPAP machine.   She currently takes Furosemide 20 mg once daily. Weight today was 110 pounds which she sates is her normal. She does have some edema in her left foot.  An appointment has been made for today at 9:45.      COVID-19 Pre-Screening Questions:  . In the past 7 to 10 days have you had a cough,  shortness of breath, headache, congestion, fever (100 or greater) body aches, chills, sore throat, or sudden loss of taste or sense of smell? No . Have you been around anyone with known Covid 19. No . Have you been around anyone who is awaiting Covid 19 test results in the past 7 to 10 days? No . Have you been around anyone who has been exposed to Covid 19, or has mentioned symptoms of Covid 19 within the past 7 to 10 days? No  If you have any concerns/questions about symptoms patients report during screening (either on the phone or at threshold). Contact the provider seeing the patient or DOD for further guidance.  If neither are available contact a member of the leadership team.

## 2019-07-23 NOTE — Assessment & Plan Note (Signed)
I suggested she resume C-pap

## 2019-07-23 NOTE — Assessment & Plan Note (Signed)
Moderate CAD at cath 2011

## 2019-07-23 NOTE — Assessment & Plan Note (Signed)
EF 30% 2011, 40% 2012, 45% 2013 Check echo

## 2019-07-23 NOTE — Assessment & Plan Note (Signed)
Pt seen today with history c/w orthopnea the last two nights.

## 2019-07-23 NOTE — Assessment & Plan Note (Deleted)
On C-pap 

## 2019-07-23 NOTE — Assessment & Plan Note (Signed)
GFR 31 in 2018-recent labs done by PCP July 2020- will obtain

## 2019-07-23 NOTE — Patient Instructions (Signed)
Medication Instructions:  INCREASE Lasix to 40mg  Take 1 tablet once a day If you need a refill on your cardiac medications before your next appointment, please call your pharmacy.   Lab work: None  If you have labs (blood work) drawn today and your tests are completely normal, you will receive your results only by: Marland Kitchen MyChart Message (if you have MyChart) OR . A paper copy in the mail If you have any lab test that is abnormal or we need to change your treatment, we will call you to review the results.  Testing/Procedures: Your physician has requested that you have an echocardiogram. Echocardiography is a painless test that uses sound waves to create images of your heart. It provides your doctor with information about the size and shape of your heart and how well your heart's chambers and valves are working. This procedure takes approximately one hour. There are no restrictions for this procedure. THIS TEST WILL BE COMPLETED AT [place] [address].  Follow-Up: At [facility], you and your health needs are our priority.  As part of our continuing mission to provide you with exceptional heart care, we have created designated Provider Care Teams.  These Care Teams include your primary Cardiologist (physician) and Advanced Practice Providers (APPs -  Physician Assistants and Nurse Practitioners) who all work together to provide you with the care you need, when you need it. You will need a follow up appointment in 6 weeks. You may see Peter Martinique, MD or one of the following Advanced Practice Providers on your designated Care Team: [place], [place] . Fabian Sharp, PA-C   Your physician recommends that you schedule a follow-up appointment in: [place], PA-C  Any Other Special Instructions Will Be Listed Below (If Applicable).

## 2019-07-23 NOTE — Telephone Encounter (Signed)
Pt c/o Shortness Of Breath: STAT if SOB developed within the last 24 hours or pt is noticeably SOB on the phone  1. Are you currently SOB (can you hear that pt is SOB on the phone)? no  2. How long have you been experiencing SOB? Past two nights  3. Are you SOB when sitting or when up moving around? When lying during the night  4. Are you currently experiencing any other symptoms? Just SOB at night, keeps her wake all night.

## 2019-07-24 ENCOUNTER — Other Ambulatory Visit (INDEPENDENT_AMBULATORY_CARE_PROVIDER_SITE_OTHER): Payer: Medicare Other

## 2019-07-24 DIAGNOSIS — E7800 Pure hypercholesterolemia, unspecified: Secondary | ICD-10-CM

## 2019-07-24 DIAGNOSIS — I2510 Atherosclerotic heart disease of native coronary artery without angina pectoris: Secondary | ICD-10-CM

## 2019-07-24 DIAGNOSIS — I428 Other cardiomyopathies: Secondary | ICD-10-CM

## 2019-07-24 DIAGNOSIS — I10 Essential (primary) hypertension: Secondary | ICD-10-CM

## 2019-07-30 ENCOUNTER — Other Ambulatory Visit: Payer: Self-pay

## 2019-07-30 ENCOUNTER — Ambulatory Visit (HOSPITAL_COMMUNITY): Payer: Medicare Other | Attending: Cardiovascular Disease

## 2019-07-30 DIAGNOSIS — N183 Chronic kidney disease, stage 3 (moderate): Secondary | ICD-10-CM | POA: Diagnosis not present

## 2019-07-30 DIAGNOSIS — I10 Essential (primary) hypertension: Secondary | ICD-10-CM | POA: Diagnosis not present

## 2019-07-30 DIAGNOSIS — G4733 Obstructive sleep apnea (adult) (pediatric): Secondary | ICD-10-CM

## 2019-07-30 DIAGNOSIS — I2510 Atherosclerotic heart disease of native coronary artery without angina pectoris: Secondary | ICD-10-CM | POA: Diagnosis not present

## 2019-07-30 DIAGNOSIS — R0601 Orthopnea: Secondary | ICD-10-CM | POA: Insufficient documentation

## 2019-07-30 DIAGNOSIS — I428 Other cardiomyopathies: Secondary | ICD-10-CM | POA: Insufficient documentation

## 2019-07-30 DIAGNOSIS — E7800 Pure hypercholesterolemia, unspecified: Secondary | ICD-10-CM | POA: Diagnosis not present

## 2019-07-31 ENCOUNTER — Telehealth: Payer: Self-pay | Admitting: Cardiology

## 2019-07-31 NOTE — Telephone Encounter (Signed)
Called patient, spoke with patient and daughter- advised of ECHO results/ along with lab results. They would like to know if a referral should be sent to a kidney specialists with her renal disease, advised I would send a message to verify.

## 2019-07-31 NOTE — Telephone Encounter (Signed)
They need to discuss that with Dr Nash Shearer PA-C 07/31/2019 12:02 PM

## 2019-07-31 NOTE — Telephone Encounter (Signed)
Patient called wanting to know her Echo results.

## 2019-07-31 NOTE — Telephone Encounter (Signed)
Called and LVM advising that referral comes from PCP office. Left call back number if questions.

## 2019-08-14 ENCOUNTER — Encounter: Payer: Self-pay | Admitting: Cardiology

## 2019-08-14 ENCOUNTER — Ambulatory Visit (INDEPENDENT_AMBULATORY_CARE_PROVIDER_SITE_OTHER): Payer: Medicare Other | Admitting: Cardiology

## 2019-08-14 ENCOUNTER — Other Ambulatory Visit: Payer: Self-pay

## 2019-08-14 DIAGNOSIS — N183 Chronic kidney disease, stage 3 unspecified: Secondary | ICD-10-CM

## 2019-08-14 DIAGNOSIS — I6523 Occlusion and stenosis of bilateral carotid arteries: Secondary | ICD-10-CM | POA: Diagnosis not present

## 2019-08-14 DIAGNOSIS — I471 Supraventricular tachycardia: Secondary | ICD-10-CM

## 2019-08-14 DIAGNOSIS — Z01818 Encounter for other preprocedural examination: Secondary | ICD-10-CM

## 2019-08-14 DIAGNOSIS — I493 Ventricular premature depolarization: Secondary | ICD-10-CM | POA: Diagnosis not present

## 2019-08-14 DIAGNOSIS — I5023 Acute on chronic systolic (congestive) heart failure: Secondary | ICD-10-CM | POA: Diagnosis not present

## 2019-08-14 DIAGNOSIS — G4733 Obstructive sleep apnea (adult) (pediatric): Secondary | ICD-10-CM

## 2019-08-14 DIAGNOSIS — I491 Atrial premature depolarization: Secondary | ICD-10-CM | POA: Diagnosis not present

## 2019-08-14 DIAGNOSIS — I428 Other cardiomyopathies: Secondary | ICD-10-CM

## 2019-08-14 DIAGNOSIS — I4719 Other supraventricular tachycardia: Secondary | ICD-10-CM

## 2019-08-14 DIAGNOSIS — I2510 Atherosclerotic heart disease of native coronary artery without angina pectoris: Secondary | ICD-10-CM | POA: Diagnosis not present

## 2019-08-14 DIAGNOSIS — I10 Essential (primary) hypertension: Secondary | ICD-10-CM

## 2019-08-14 HISTORY — DX: Acute on chronic systolic (congestive) heart failure: I50.23

## 2019-08-14 NOTE — Assessment & Plan Note (Deleted)
Patient added on to my scheduled this afternoon for pre op clearance prior to surgery for infected prosthetic knee

## 2019-08-14 NOTE — Assessment & Plan Note (Signed)
Check BMP- GFR chronically in the 30's

## 2019-08-14 NOTE — Assessment & Plan Note (Signed)
Pt has resumed C-pap

## 2019-08-14 NOTE — Assessment & Plan Note (Signed)
9 beats of PAT documented 08/14/2019- continue Coreg

## 2019-08-14 NOTE — Patient Instructions (Signed)
Medication Instructions:  Your physician recommends that you continue on your current medications as directed. Please refer to the Current Medication list given to you today. If you need a refill on your cardiac medications before your next appointment, please call your pharmacy.   Lab work: Your physician recommends that you return for lab work in: TODAY-BMET  If you have labs (blood work) drawn today and your tests are completely normal, you will receive your results only by: Marland Kitchen MyChart Message (if you have MyChart) OR . A paper copy in the mail If you have any lab test that is abnormal or we need to change your treatment, we will call you to review the results.  Testing/Procedures: NONE   Follow-Up: At [facility], you and your health needs are our priority.  As part of our continuing mission to provide you with exceptional heart care, we have created designated Provider Care Teams.  These Care Teams include your primary Cardiologist (physician) and Advanced Practice Providers (APPs -  Physician Assistants and Nurse Practitioners) who all work together to provide you with the care you need, when you need it. You will need a follow up appointment in 6-8 weeks.  Please call our office 2 months in advance to schedule this appointment.  You may see Peter Martinique, MD or one of the following Advanced Practice Providers on your designated Care Team: [place], [place] . Fabian Sharp, PA-C  Any Other Special Instructions Will Be Listed Below (If Applicable).

## 2019-08-14 NOTE — Assessment & Plan Note (Signed)
EF 30% 2011, 40% 2012, 45% 2013 Now down to 20% by echo 07/30/2019

## 2019-08-14 NOTE — Assessment & Plan Note (Signed)
Pt has diuresed 7 lbs with improvement in her symptoms.  Check BMP today, we may have to cut her Lasix back.

## 2019-08-14 NOTE — Progress Notes (Signed)
Cardiology Office Note:    Date:  08/14/2019   ID:  Paige Cook, DOB Feb 07, 1928, MRN [id number]  PCP:  Burnard Bunting, MD  Cardiologist:  Peter Martinique, MD  Electrophysiologist:  None   Referring MD: Burnard Bunting, MD   Chief Complaint  Patient presents with  . Follow-up   History of Present Illness:    Paige Cook is a pleasant frail 85 y.o. female with a hx of NICM, OSA, and CRI.  She was seen in the office 07/23/2019.  She had been having lower extremity edema and orthopnea.  Her Lasix had been increased from 20 mg daily to 40 mg daily.  Echocardiogram was obtained which showed a drop in her ejection fraction to 20%.  Her LV was severely dilated.  She had moderate MR.  Review of her labs show the patient has chronic renal insufficiency with a GFR in the low 30s.  She also has sleep apnea and admitted that she was noncompliant with CPAP.  She returns to the office today for follow-up.  She is back on her CPAP.  Her edema is improved and her shortness of breath is improved.  She is frail, she is walking with the help of her family.  Her weight is down 7 pounds from her office visit on 8 6.  Past Medical History:  Diagnosis Date  . Anemia   . Arthritis   . Bladder cancer ([place])    giant tumor  . CAD (coronary artery disease)   . Cardiac arrhythmia    benign  . Carotid bruit    left  . CHF (congestive heart failure) (HCC)    CHF with systolic dysfuntion ; dilated nonishemic cardiomyopathy  . Colon cancer ([place]) 1989  . Colon polyps   . Diverticulosis   . Epistaxis   . Female bladder prolapse   . Gallstones   . Hypercholesteremia   . Hyponatremia    Probably secondary to her CHF  . OSA (obstructive sleep apnea) 04/29/2012  . SOB (shortness of breath)    Sometimes at night    Past Surgical History:  Procedure Laterality Date  . ABDOMINAL HYSTERECTOMY    . BLADDER SURGERY  2013   cancer removed  . bladder tact    . CARDIAC CATHETERIZATION     non obstruction CAD   . CHOLECYSTECTOMY    . 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 COLONOSCOPY      Current Medications: Current Meds  Medication Sig  . aspirin 81 MG chewable tablet Chew 81 mg by mouth daily with breakfast.  . calcium-vitamin D (OSCAL WITH D) 500-200 MG-UNIT per tablet Take 1 tablet by mouth every evening.   . carvedilol (COREG) 6.25 MG tablet TAKE 1 TABLET BY MOUTH TWICE A DAY WITH A MEAL.  Marland Kitchen Cyanocobalamin (B-12 PO) Take by mouth.  . furosemide (LASIX) 40 MG tablet Take 1 tablet (40 mg total) by mouth daily.  Marland Kitchen glucosamine-chondroitin 500-400 MG tablet Take 1 tablet by mouth daily with breakfast.   . polyethylene glycol (MIRALAX / GLYCOLAX) packet Take 17 g by mouth daily.  . ramipril (ALTACE) 5 MG capsule TAKE 1 CAPSULE BY MOUTH THREE TIMES A WEEK  . vitamin C (ASCORBIC ACID) 250 MG tablet Take 250 mg by mouth daily with breakfast.     Allergies:   Penicillins and Tramadol   Social History   Socioeconomic History  . Marital status: Married    Spouse name: Not on file  .  Number of children: 4  . Years of education: Not on file  . Highest education level: Not on file  Occupational History  . Occupation: retired Paramedic  Social Needs  . Financial resource strain: Not on file  . Food insecurity    Worry: Not on file    Inability: Not on file  . Transportation needs    Medical: Not on file    Non-medical: Not on file  Tobacco Use  . Smoking status: Never Smoker  . Smokeless tobacco: Never Used  Substance and Sexual Activity  . Alcohol use: No  . Drug use: No  . Sexual activity: Not Currently  Lifestyle  . Physical activity    Days per week: Not on file    Minutes per session: Not on file  . Stress: Not on file  Relationships  . Social Herbalist on phone: Not on file    Gets together: Not on file    Attends religious service: Not on file    Active member of club or organization: Not on file    Attends meetings of clubs or organizations: Not  on file    Relationship status: Not on file  Other Topics Concern  . Not on file  Social History Narrative   Married, 1 son and 3 daughters.    One coffee a day no alcohol no tobacco   05/05/2015     Family History: The patient's family history includes Heart attack in her father and mother; Heart disease in her father; Heart failure (age of onset: 39) in her sister. There is no history of Stroke.  ROS:   Please see the history of present illness.     All other systems reviewed and are negative.  EKGs/Labs/Other Studies Reviewed:    The following studies were reviewed today: Echo 8/13/202  EKG:  EKG is ordered today.  The ekg ordered today demonstrates NSR with 9 beats of PAT.  Recent Labs: No results found for requested labs within last 8760 hours.  Recent Lipid Panel    Component Value Date/Time   CHOL 149 11/03/2015 1042   TRIG 74 11/03/2015 1042   HDL 68 11/03/2015 1042   CHOLHDL 2.2 11/03/2015 1042   VLDL 15 11/03/2015 1042   LDLCALC 66 11/03/2015 1042   LDLDIRECT 152.9 03/06/2013 0924    Physical Exam:    VS:  BP 98/64   Pulse 89   Temp 98.2 F (36.8 C)   Ht 5\' 4"  (1.626 m)   Wt 107 lb 9.6 oz (48.8 kg)   SpO2 98%   BMI 18.47 kg/m     Wt Readings from Last 3 Encounters:  08/14/19 107 lb 9.6 oz (48.8 kg)  07/23/19 115 lb (52.2 kg)  02/02/19 112 lb (50.8 kg)     GEN: Elderly frail female in wheel chair in no acute distress HEENT: Normal NECK: No JVD; No carotid bruits LYMPHATICS: No lymphadenopathy CARDIAC: irregularly irregular, no murmurs, rubs, gallops RESPIRATORY:  Severe kyphoscoliosis, clear lung fileds ABDOMEN: Soft, non-tender, non-distended MUSCULOSKELETAL:  No edema; No deformity  SKIN: Warm and dry NEUROLOGIC:  Alert and oriented x 3 PSYCHIATRIC:  Normal affect   ASSESSMENT:    Acute on chronic systolic (congestive) heart failure (HCC) Pt has diuresed 7 lbs with improvement in her symptoms.  Check BMP today, we may have to cut her  Lasix back.   NICM (nonischemic cardiomyopathy) ([place]) EF 30% 2011, 40% 2012, 45% 2013 Now down to 20% by  echo 07/30/2019  OSA (obstructive sleep apnea) Pt has resumed C-pap  CKD (chronic kidney disease), stage III (HCC) Check BMP- GFR chronically in the 30's  PAT (paroxysmal atrial tachycardia) (HCC) 9 beats of PAT documented 08/14/2019- continue Coreg  PLAN:    Check BMP- adjust Lasix if needed.  Continue Altace 5 mg 3 times a week as she has been on, unless there is marked deterioration in her renal function.  She did have some PAT- continue Coreg at current dose (B/P borderline low).  F/U Dr Martinique 6-8 weeks.   I had discussion with the patient and her daughter about end-of-life issues.  I explained that with her ejection fraction being 20% that she was at risk for sudden cardiac death.  Patient indicated that she would want anything done that would prolong her life.  I talked about an ICD and they would like to speak with an electrophysiologist about this.  I will arrange an office visit with the EP service to discuss this further.   Medication Adjustments/Labs and Tests Ordered: Current medicines are reviewed at length with the patient today.  Concerns regarding medicines are outlined above.  Orders Placed This Encounter  Procedures  . Basic Metabolic Panel (BMET)   No orders of the defined types were placed in this encounter.   Patient Instructions  Medication Instructions:  Your physician recommends that you continue on your current medications as directed. Please refer to the Current Medication list given to you today. If you need a refill on your cardiac medications before your next appointment, please call your pharmacy.   Lab work: Your physician recommends that you return for lab work in: TODAY-BMET  If you have labs (blood work) drawn today and your tests are completely normal, you will receive your results only by: Marland Kitchen MyChart Message (if you have MyChart) OR . A  paper copy in the mail If you have any lab test that is abnormal or we need to change your treatment, we will call you to review the results.  Testing/Procedures: NONE   Follow-Up: At [facility], you and your health needs are our priority.  As part of our continuing mission to provide you with exceptional heart care, we have created designated Provider Care Teams.  These Care Teams include your primary Cardiologist (physician) and Advanced Practice Providers (APPs -  Physician Assistants and Nurse Practitioners) who all work together to provide you with the care you need, when you need it. You will need a follow up appointment in 6-8 weeks.  Please call our office 2 months in advance to schedule this appointment.  You may see Peter Martinique, MD or one of the following Advanced Practice Providers on your designated Care Team: [place], [place] . Fabian Sharp, PA-C  Any Other Special Instructions Will Be Listed Below (If Applicable).      Signed, Kerin Ransom, PA-C  08/14/2019 2:10 PM    [facility]

## 2019-08-15 LAB — BASIC METABOLIC PANEL
BUN/Creatinine Ratio: 36 — ABNORMAL HIGH (ref 12–28)
BUN: 57 mg/dL — ABNORMAL HIGH (ref 8–27)
CO2: 23 mmol/L (ref 20–29)
Calcium: 8.9 mg/dL (ref 8.7–10.3)
Chloride: 99 mmol/L (ref 96–106)
Creatinine, Ser: 1.57 mg/dL — ABNORMAL HIGH (ref 0.57–1.00)
GFR calc Af Amer: 33 mL/min/{1.73_m2} — ABNORMAL LOW (ref >59)
GFR calc non Af Amer: 29 mL/min/{1.73_m2} — ABNORMAL LOW (ref >59)
Glucose: 105 mg/dL — ABNORMAL HIGH (ref 65–99)
Potassium: 4.6 mmol/L (ref 3.5–5.2)
Sodium: 140 mmol/L (ref 134–144)

## 2019-08-17 ENCOUNTER — Other Ambulatory Visit (INDEPENDENT_AMBULATORY_CARE_PROVIDER_SITE_OTHER): Payer: Medicare Other

## 2019-08-17 DIAGNOSIS — I10 Essential (primary) hypertension: Secondary | ICD-10-CM

## 2019-08-17 DIAGNOSIS — I491 Atrial premature depolarization: Secondary | ICD-10-CM

## 2019-08-17 DIAGNOSIS — I493 Ventricular premature depolarization: Secondary | ICD-10-CM

## 2019-08-17 DIAGNOSIS — I428 Other cardiomyopathies: Secondary | ICD-10-CM

## 2019-08-17 DIAGNOSIS — I2510 Atherosclerotic heart disease of native coronary artery without angina pectoris: Secondary | ICD-10-CM

## 2019-08-17 DIAGNOSIS — I5023 Acute on chronic systolic (congestive) heart failure: Secondary | ICD-10-CM

## 2019-08-17 DIAGNOSIS — I4719 Other supraventricular tachycardia: Secondary | ICD-10-CM

## 2019-08-17 DIAGNOSIS — I471 Supraventricular tachycardia: Secondary | ICD-10-CM

## 2019-08-26 DIAGNOSIS — M40209 Unspecified kyphosis, site unspecified: Secondary | ICD-10-CM | POA: Diagnosis not present

## 2019-08-26 DIAGNOSIS — M419 Scoliosis, unspecified: Secondary | ICD-10-CM | POA: Diagnosis not present

## 2019-08-26 DIAGNOSIS — M47816 Spondylosis without myelopathy or radiculopathy, lumbar region: Secondary | ICD-10-CM | POA: Diagnosis not present

## 2019-08-26 DIAGNOSIS — M791 Myalgia, unspecified site: Secondary | ICD-10-CM | POA: Diagnosis not present

## 2019-08-28 ENCOUNTER — Telehealth: Payer: Self-pay | Admitting: Cardiology

## 2019-08-28 NOTE — Telephone Encounter (Signed)
Pt's daughter calling stating pt is scheduled to have a RFA injection on Tuesday due to back pain. Daughter calling to see if Dr. Martinique feels pt would be ok to proceed.

## 2019-08-28 NOTE — Telephone Encounter (Signed)
Daughter updated and voiced understanding.  

## 2019-08-28 NOTE — Telephone Encounter (Signed)
Per pt's daughters call has a question about pt and just a FYI before she has a RSA.  Please give her a call back.

## 2019-08-28 NOTE — Telephone Encounter (Signed)
Yes this is fine  Alexx Mcburney Martinique MD, [facility]

## 2019-09-01 ENCOUNTER — Ambulatory Visit: Payer: Medicare Other | Admitting: Physician Assistant

## 2019-09-01 DIAGNOSIS — M47816 Spondylosis without myelopathy or radiculopathy, lumbar region: Secondary | ICD-10-CM | POA: Diagnosis not present

## 2019-09-04 ENCOUNTER — Telehealth: Payer: Self-pay | Admitting: Internal Medicine

## 2019-09-04 NOTE — Telephone Encounter (Signed)
New Message:   Daughter called and said she will need to come with pt for her appointment on 09-09-19-

## 2019-09-04 NOTE — Telephone Encounter (Signed)
Spoke with pts daughter. Pt is weak and does not ambulate well. I approved daughter to come in with her during the appointment.

## 2019-09-09 ENCOUNTER — Encounter: Payer: Self-pay | Admitting: Internal Medicine

## 2019-09-09 ENCOUNTER — Other Ambulatory Visit: Payer: Self-pay

## 2019-09-09 ENCOUNTER — Ambulatory Visit (INDEPENDENT_AMBULATORY_CARE_PROVIDER_SITE_OTHER): Payer: Medicare Other | Admitting: Internal Medicine

## 2019-09-09 VITALS — BP 110/50 | HR 78 | Ht 64.0 in | Wt 114.8 lb

## 2019-09-09 DIAGNOSIS — I428 Other cardiomyopathies: Secondary | ICD-10-CM

## 2019-09-09 NOTE — Patient Instructions (Addendum)
Medication Instructions:  Your physician recommends that you continue on your current medications as directed. Please refer to the Current Medication list given to you today.  * If you need a refill on your cardiac medications before your next appointment, please call your pharmacy.   Labwork: None ordered  Testing/Procedures: None ordered  Follow-Up: No follow up is needed at this time with Dr. Caryl Comes.  He will see you on an as needed basis.     Thank you for choosing [facility]!!     Any Other Special Instructions Will Be Listed Below (If Applicable).

## 2019-09-09 NOTE — Progress Notes (Signed)
ELECTROPHYSIOLOGY CONSULT NOTE  Patient ID: Paige Cook, MRN: [id number], DOB/AGE: Apr 24, 1928 85 y.o. Admit date: (Not on file) Date of Consult: 09/09/2019  Primary Physician: Burnard Bunting, MD Primary Cardiologist: Shirlee More      Paige Cook is a 85 y.o. female who is being seen today for the evaluation of ICD at the request of LK/PJ.    HPI Paige Cook is a 85 y.o. female  Referred for consideration of an ICD  She has a history of longstanding cardiomyopathy dating back to 2012.  She has nonobstructive coronary artery disease.  (catheterization 2011)  she has chronic systolic heart failure manifested mostly by nocturnal dyspnea.  She has some peripheral edema and exercise intolerance.  It is been felt that this is been both cardiac and orthopedic in contribution (scoliosis).  She has a history of bladder cancer for which he underwent surgery at [facility] about 6 years ago.  No syncope  Today Past Medical History:  Diagnosis Date  . Anemia   . Arthritis   . Bladder cancer ([place])    giant tumor  . CAD (coronary artery disease)   . Cardiac arrhythmia    benign  . Carotid bruit    left  . CHF (congestive heart failure) (HCC)    CHF with systolic dysfuntion ; dilated nonishemic cardiomyopathy  . Colon cancer ([place]) 1989  . Colon polyps   . Diverticulosis   . Epistaxis   . Female bladder prolapse   . Gallstones   . Hypercholesteremia   . Hyponatremia    Probably secondary to her CHF  . OSA (obstructive sleep apnea) 04/29/2012  . SOB (shortness of breath)    Sometimes at night      Surgical History:  Past Surgical History:  Procedure Laterality Date  . ABDOMINAL HYSTERECTOMY    . BLADDER SURGERY  2013   cancer removed  . bladder tact    . CARDIAC CATHETERIZATION     non obstruction CAD  . CHOLECYSTECTOMY    . 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 COLONOSCOPY       Home Meds: Current Meds  Medication Sig  . aspirin 81 MG chewable tablet Chew 81 mg by  mouth daily with breakfast.  . calcium-vitamin D (OSCAL WITH D) 500-200 MG-UNIT per tablet Take 1 tablet by mouth every evening.   . carvedilol (COREG) 6.25 MG tablet TAKE 1 TABLET BY MOUTH TWICE A DAY WITH A MEAL.  Marland Kitchen Cyanocobalamin (B-12 PO) Take by mouth.  . furosemide (LASIX) 40 MG tablet Take 1 tablet (40 mg total) by mouth daily. (Patient taking differently: Take 40 mg by mouth daily. Pt takes half a tablet 3 days a week alternating with whole tablet on the other days.)  . glucosamine-chondroitin 500-400 MG tablet Take 1 tablet by mouth daily with breakfast.   . polyethylene glycol (MIRALAX / GLYCOLAX) packet Take 17 g by mouth daily.  . ramipril (ALTACE) 5 MG capsule TAKE 1 CAPSULE BY MOUTH THREE TIMES A WEEK  . vitamin C (ASCORBIC ACID) 250 MG tablet Take 250 mg by mouth daily with breakfast.    Allergies:  Allergies  Allergen Reactions  . Penicillins Hives    Has patient had a PCN reaction causing immediate rash, facial/tongue/throat swelling, SOB or lightheadedness with hypotension: Yes Has patient had a PCN reaction causing severe rash involving mucus membranes or skin necrosis: Yes Has patient had a PCN reaction that required hospitalization No Has patient  had a PCN reaction occurring within the last 10 years: No If all of the above answers are "NO", then may proceed with Cephalosporin use.   . Tramadol Nausea Only and Other (See Comments)    shakes    Social History   Socioeconomic History  . Marital status: Married    Spouse name: Not on file  . Number of children: 4  . Years of education: Not on file  . Highest education level: Not on file  Occupational History  . Occupation: retired Paramedic  Social Needs  . Financial resource strain: Not on file  . Food insecurity    Worry: Not on file    Inability: Not on file  . Transportation needs    Medical: Not on file    Non-medical: Not on file  Tobacco Use  . Smoking status: Never Smoker  . Smokeless  tobacco: Never Used  Substance and Sexual Activity  . Alcohol use: No  . Drug use: No  . Sexual activity: Not Currently  Lifestyle  . Physical activity    Days per week: Not on file    Minutes per session: Not on file  . Stress: Not on file  Relationships  . Social Herbalist on phone: Not on file    Gets together: Not on file    Attends religious service: Not on file    Active member of club or organization: Not on file    Attends meetings of clubs or organizations: Not on file    Relationship status: Not on file  . Intimate partner violence    Fear of current or ex partner: Not on file    Emotionally abused: Not on file    Physically abused: Not on file    Forced sexual activity: Not on file  Other Topics Concern  . Not on file  Social History Narrative   Married, 1 son and 3 daughters.    One coffee a day no alcohol no tobacco   05/05/2015     Family History  Problem Relation Age of Onset  . Heart disease Father   . Heart attack Father   . Heart attack Mother   . Heart failure Sister 68  . Stroke Neg Hx      ROS:  Please see the history of present illness.     All other systems reviewed and negative.    Physical Exam:  Blood pressure (!) 110/50, pulse 78, height 5\' 4"  (1.626 m), weight 114 lb 12.8 oz (52.1 kg), SpO2 98 %. General: Well developed, well nourished female in no acute distress. Head: Normocephalic, atraumatic, sclera non-icteric, no xanthomas, nares are without discharge. EENT: normal  Lymph Nodes:  none Neck: Negative for carotid bruits. JVD >10  Back:with severe  scoliosis kyphosis  Lungs: Clear bilaterally to auscultation without wheezes, rales, or rhonchi. Breathing is unlabored. Heart: RRR with S1 S2. No  murmur . No rubs, or gallops appreciated. Abdomen: Soft, non-tender, non-distended with normoactive bowel sounds. No hepatomegaly. No rebound/guarding. No obvious abdominal masses. Msk:  Strength and tone appear normal for age.  Extremities: No clubbing or cyanosis.tr edema.  Distal pedal pulses are 2+ and equal bilaterally. Skin: Warm and Dry Neuro: Alert and oriented X 3. CN III-XII intact Grossly normal sensory and motor function . Psych:  Responds to questions appropriately with a normal affect.      Labs: Cardiac Enzymes No results for input(s): CKTOTAL, CKMB, TROPONINI in the last 72 hours. CBC  Lab Results  Component Value Date   WBC 5.9 03/13/2017   HGB 10.6 (L) 03/13/2017   HCT 30.9 (L) 03/13/2017   MCV 96.0 03/13/2017   PLT 171 03/13/2017   PROTIME: No results for input(s): LABPROT, INR in the last 72 hours. Chemistry No results for input(s): NA, K, CL, CO2, BUN, CREATININE, CALCIUM, PROT, BILITOT, ALKPHOS, ALT, AST, GLUCOSE in the last 168 hours.  Invalid input(s): LABALBU Lipids Lab Results  Component Value Date   CHOL 149 11/03/2015   HDL 68 11/03/2015   LDLCALC 66 11/03/2015   TRIG 74 11/03/2015   BNP Pro B Natriuretic peptide (BNP)  Date/Time Value Ref Range Status  09/23/2013 10:30 AM 128.0 (H) 0.0 - 100.0 pg/mL Final   Thyroid Function Tests: No results for input(s): TSH, T4TOTAL, T3FREE, THYROIDAB in the last 72 hours.  Invalid input(s): FREET3 Miscellaneous No results found for: DDIMER  Radiology/Studies:  No results found.  EKG: Sinus rhythm at 78 with frequent PACs right axis deviation poor R wave progression   Assessment and Plan:  Nonischemic cardiomyopathy  Mitral regurgitation-moderate  Congestive heart failure with nocturnal dyspnea  Scoliosis   The patient has persistent left ventricular dysfunction dating back more than 8 years.  Given her age and particularly in the context of nonischemic cardiomyopathy, I do not think that there is an indication for ICD implantation for primary prevention.  Even the [place] paper-  has a maximum mean age of about 18  I have reviewed with the patient and her daughter the issue of competing causes of death, sudden  versus nonsudden and the importance of medical therapy for her nonischemic cardiomyopathy.  Suggested that perhaps nocturnal nitroglycerin may help to attenuate some of her nocturnal dyspnea.  More aggressive diuresis apparently has been challenging because of her renal insufficiency.     Virl Axe

## 2019-09-14 ENCOUNTER — Telehealth: Payer: Self-pay | Admitting: Cardiology

## 2019-09-14 MED ORDER — FUROSEMIDE 40 MG PO TABS: 40.0000 mg | ORAL_TABLET | Freq: Every day | ORAL | 3 refills | Status: DC

## 2019-09-14 NOTE — Telephone Encounter (Signed)
Spoke to patient's daughter Anderson [place], Dr.Jordan's advice given.Advised to keep appointment as planned 11/3 at 11:40 am.Advised to call sooner if mother continues to be sob.

## 2019-09-14 NOTE — Telephone Encounter (Signed)
I would increase lasix back to 40 mg daily. I doubt oxygen would benefit since oxygen levels have been normal. A Magnesium supplement is a good idea as long as it doesn't cause diarrhea. I would be concerned about using sl Ntg since BP so low. I am scheduled to see Nov 3  Audreyanna Butkiewicz Martinique MD, [facility]

## 2019-09-14 NOTE — Telephone Encounter (Signed)
° °  Pt c/o Shortness Of Breath: STAT if SOB developed within the last 24 hours or pt is noticeably SOB on the phone  1. Are you currently SOB (can you hear that pt is SOB on the phone)? Daughter was not with the patient  2. How long have you been experiencing SOB? Late summer  3. Are you SOB when sitting or when up moving around? Laying down flat  4. Are you currently experiencing any other symptoms?   Daughter wanted to know if the SOB when laying down flat was related to the [atient's CHF. The daughter reports that the patient had a hard time breathing last night, so she got very little sleep. She normally wears a CPAP machine, but it was bothering her yesterday. Daughter  wanted to know if having the patient be on oxygen if that would help the patient breathe better while she sleeps.   There was also a discussion of the patient being given Nitroglycerin. Dr. Caryl Comes mentioned it, but wanted to run it past Dr. Martinique, but she hasn't heard anything yet.   Daughter was not with the patient at the time of the phone call, but she will be back to check on her mom later this afternoon.

## 2019-09-14 NOTE — Telephone Encounter (Signed)
Call placed to the patient. Her daughter Joanette Gula is not on her dpr. She has given permission to speak with Ms. Mare Ferrari and has asked that we call her.   The daughter stated that for the last month that the patient has had to sleep with her bed propped up. She is having a difficult time breathing when she is lying flat. She stated that she feels like it got worse when the patient's Furosemide was decreased from 40 mg once daily to Furosemide 40 mg 4 days a week and 20 mg three days a week.   The patient does not have shortness of breath during the day and only has mild bilateral ankle swelling.   Her weight this morning was 112 pounds. At her last visit on 8/28 is was 107 but the daughter stated that she feels like it was lower due to her recent problem with diarrhea. Her normal weight is around 112 lbs.  She would also like to know if the patient needs: 1. To be on a magnesium supplement 2. Can she take nitroglycerin? She stated that Dr. Caryl Comes was going to discuss this with Dr. Martinique. Her blood pressures tend to run low. 3. Could the patient benefit from wearing oxygen at night?  Wt Readings from Last 3 Encounters:  08/14/19 107 lb 9.6 oz (48.8 kg)  07/23/19 115 lb (52.2 kg)  02/02/19 112 lb (50.8 kg)

## 2019-09-22 DIAGNOSIS — M47816 Spondylosis without myelopathy or radiculopathy, lumbar region: Secondary | ICD-10-CM | POA: Diagnosis not present

## 2019-10-02 DIAGNOSIS — Z23 Encounter for immunization: Secondary | ICD-10-CM | POA: Diagnosis not present

## 2019-10-12 DIAGNOSIS — M47816 Spondylosis without myelopathy or radiculopathy, lumbar region: Secondary | ICD-10-CM | POA: Diagnosis not present

## 2019-10-12 DIAGNOSIS — M419 Scoliosis, unspecified: Secondary | ICD-10-CM | POA: Diagnosis not present

## 2019-10-17 NOTE — Progress Notes (Signed)
[name] Date of Birth: [birth year] Medical Record #[id number]  History of Present Illness: Ms. Burgard is seen for follow up CHF. She has a history of nonischemic CM. EF in 2012 was 40%.  Subsequent Echo in 2013 showed an EF 45%. She has a history of  very frequent PACs on past Holter. No atrial fib has been identified. Other issues include nonobstructive CAD, PVCs, HLD, bladder cancer, and OSA  and using CPAP therapy.   When seen in February 2020 she was noted to have mild edema but was otherwise asymptomatic. In August she presented with complaints of PND. Lasix was increased. Echo was obtained and showed a reduction in EF to 20% with moderate MR. It was noted that she was noncompliant with CPAP. Symptoms did improve with increased lasix. She was already on ramipril and Coreg. She was seen by Dr Caryl Comes who felt she was not a candidate for prophylactic ICD due to age and comorbidities. Titration of medications is limited by CKD and low BP. She reports compliance now with CPAP  She was seen in August with Dr Reynaldo Minium. Noted to have elevated BUN and creatinine. Lasix reduced to 3x/week. She later reported increase in edema and SOB. One night she thought she might have to go to the hospital. She increased lasix back to daily and symptoms resolved. Her husband did pass away this past February after a long illness.    Current Outpatient Medications on File Prior to Visit  Medication Sig Dispense Refill  . aspirin 81 MG chewable tablet Chew 81 mg by mouth daily with breakfast.    . calcium-vitamin D (OSCAL WITH D) 500-200 MG-UNIT per tablet Take 1 tablet by mouth every evening.     . carvedilol (COREG) 6.25 MG tablet TAKE 1 TABLET BY MOUTH TWICE A DAY WITH A MEAL. 180 tablet 2  . Cyanocobalamin (B-12 PO) Take by mouth.    . furosemide (LASIX) 40 MG tablet Take 1 tablet (40 mg total) by mouth daily. 90 tablet 3  . glucosamine-chondroitin 500-400 MG tablet Take 1 tablet by mouth daily with breakfast.      . polyethylene glycol (MIRALAX / GLYCOLAX) packet Take 17 g by mouth daily. 14 each 0  . ramipril (ALTACE) 5 MG capsule TAKE 1 CAPSULE BY MOUTH THREE TIMES A WEEK 90 capsule 1  . vitamin C (ASCORBIC ACID) 250 MG tablet Take 250 mg by mouth daily with breakfast.     No current facility-administered medications on file prior to visit.     Allergies  Allergen Reactions  . Penicillins Hives    Has patient had a PCN reaction causing immediate rash, facial/tongue/throat swelling, SOB or lightheadedness with hypotension: Yes Has patient had a PCN reaction causing severe rash involving mucus membranes or skin necrosis: Yes Has patient had a PCN reaction that required hospitalization No Has patient had a PCN reaction occurring within the last 10 years: No If all of the above answers are "NO", then may proceed with Cephalosporin use.   . Tramadol Nausea Only and Other (See Comments)    shakes    Past Medical History:  Diagnosis Date  . Anemia   . Arthritis   . Bladder cancer ([place])    giant tumor  . CAD (coronary artery disease)   . Cardiac arrhythmia    benign  . Carotid bruit    left  . CHF (congestive heart failure) ([facility])    CHF with systolic dysfuntion ; dilated nonishemic cardiomyopathy  . Colon  cancer ([place]) 1989  . Colon polyps   . Diverticulosis   . Epistaxis   . Female bladder prolapse   . Gallstones   . Hypercholesteremia   . Hyponatremia    Probably secondary to her CHF  . OSA (obstructive sleep apnea) 04/29/2012  . SOB (shortness of breath)    Sometimes at night    Past Surgical History:  Procedure Laterality Date  . ABDOMINAL HYSTERECTOMY    . BLADDER SURGERY  2013   cancer removed  . bladder tact    . CARDIAC CATHETERIZATION     non obstruction CAD  . CHOLECYSTECTOMY    . 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 COLONOSCOPY      Social History   Tobacco Use  Smoking Status Never Smoker  Smokeless Tobacco Never Used    Social History   Substance  and Sexual Activity  Alcohol Use No    Family History  Problem Relation Age of Onset  . Heart disease Father   . Heart attack Father   . Heart attack Mother   . Heart failure Sister 35  . Stroke Neg Hx     Review of Systems: The review of systems is per the HPI.  All other systems were reviewed and are negative.  Physical Exam: BP 104/61   Pulse 84   Ht 5\' 4"  (1.626 m)   Wt 112 lb (50.8 kg)   SpO2 99%   BMI 19.22 kg/m  GENERAL:  Well appearing, elderly WF in NAD HEENT:  PERRL, EOMI, sclera are clear. Oropharynx is clear. NECK:  No jugular venous distention, carotid upstroke brisk and symmetric, no bruits, no thyromegaly or adenopathy LUNGS:  Clear to auscultation bilaterally CHEST:  Unremarkable HEART:  RRR,  PMI not displaced or sustained,S1 and S2 within normal limits, no S3, no S4: no clicks, no rubs, gr 2/6 systolic murmur LUSB ABD:  Soft, nontender. BS +, no masses or bruits. No hepatomegaly, no splenomegaly EXT:  2 + pulses throughout, trace left ankle edema, no cyanosis no clubbing Back: marked scoliosis. SKIN:  Warm and dry.  No rashes NEURO:  Alert and oriented x 3. Cranial nerves II through XII intact. PSYCH:  Cognitively intact    LABORATORY DATA:   Lab Results  Component Value Date   WBC 5.9 03/13/2017   HGB 10.6 (L) 03/13/2017   HCT 30.9 (L) 03/13/2017   PLT 171 03/13/2017   GLUCOSE 105 (H) 08/14/2019   CHOL 149 11/03/2015   TRIG 74 11/03/2015   HDL 68 11/03/2015   LDLDIRECT 152.9 03/06/2013   LDLCALC 66 11/03/2015   ALT 12 (L) 03/13/2017   AST 18 03/13/2017   NA 140 08/14/2019   K 4.6 08/14/2019   CL 99 08/14/2019   CREATININE 1.57 (H) 08/14/2019   BUN 57 (H) 08/14/2019   CO2 23 08/14/2019   TSH 0.82 07/25/2012   INR 0.97 01/21/2011   Labs dated 09/26/17: cholesterol 121, triglycerides 126, HDL 67, Hgb 9.7. Chemistries normal.  Dated 06/22/19: cholesterol 178, triglycerides 51, HDL 64, LDL 104. Hgb 11.TSH normal Dated 08/14/19: BUN 57,  creatinine 1.57.     Echo 07/30/19: IMPRESSIONS    1. The left ventricle has a visually estimated ejection fraction of 20%. The cavity size was severely dilated. Left ventricular diastolic Doppler parameters are consistent with restrictive filling. Elevated left ventricular end-diastolic pressure Left  ventrical global hypokinesis without regional wall motion abnormalities.  2. The right ventricle has normal systolic function. The  cavity was normal. There is no increase in right ventricular wall thickness.  3. Left atrial size was moderately dilated.  4. Right atrial size was mildly dilated.  5. Moderate thickening of the mitral valve leaflet. Mild calcification of the mitral valve leaflet. There is mild mitral annular calcification present. Mitral valve regurgitation is moderate by color flow Doppler.  6. The aortic valve is tricuspid. Moderate thickening of the aortic valve. Sclerosis without any evidence of stenosis of the aortic valve. Aortic valve regurgitation is mild by color flow Doppler.  7. The aorta is normal unless otherwise noted.  Assessment / Plan: 1.  Chronic systolic CHF. Likely nonischemic. EF 20%. She clinically is doing well on Coreg, ramipril, and lasix. Will need to update BMET now that she is taking lasix daily. Will check BNP also. May need to adjust lasix if renal function is worse.   2. Nonobstructive CAD - no symptoms reported.   3. Carotid disease - no symptoms reported. Last carotid dopplers showed only mild disease.   4. HLD- excellent control  6. Sleep apnea.  on CPAP  7. PACs/PVCs  8. CKD stage 3-4.  Follow up in 3 months.

## 2019-10-20 ENCOUNTER — Encounter: Payer: Self-pay | Admitting: Cardiology

## 2019-10-20 ENCOUNTER — Ambulatory Visit (INDEPENDENT_AMBULATORY_CARE_PROVIDER_SITE_OTHER): Payer: Medicare Other | Admitting: Cardiology

## 2019-10-20 ENCOUNTER — Other Ambulatory Visit: Payer: Self-pay

## 2019-10-20 VITALS — BP 104/61 | HR 84 | Ht 64.0 in | Wt 112.0 lb

## 2019-10-20 DIAGNOSIS — I5022 Chronic systolic (congestive) heart failure: Secondary | ICD-10-CM | POA: Diagnosis not present

## 2019-10-20 DIAGNOSIS — N1832 Chronic kidney disease, stage 3b: Secondary | ICD-10-CM

## 2019-10-20 DIAGNOSIS — I6523 Occlusion and stenosis of bilateral carotid arteries: Secondary | ICD-10-CM | POA: Diagnosis not present

## 2019-10-20 DIAGNOSIS — R0602 Shortness of breath: Secondary | ICD-10-CM

## 2019-10-20 DIAGNOSIS — I493 Ventricular premature depolarization: Secondary | ICD-10-CM | POA: Diagnosis not present

## 2019-10-20 DIAGNOSIS — I10 Essential (primary) hypertension: Secondary | ICD-10-CM | POA: Diagnosis not present

## 2019-10-20 DIAGNOSIS — I428 Other cardiomyopathies: Secondary | ICD-10-CM

## 2019-10-20 DIAGNOSIS — I2510 Atherosclerotic heart disease of native coronary artery without angina pectoris: Secondary | ICD-10-CM | POA: Diagnosis not present

## 2019-10-20 NOTE — Patient Instructions (Signed)
Restrict your sodium intake  We will recheck your kidney function today to help determine optimal diuretic dose  Follow up in 3 months

## 2019-10-21 ENCOUNTER — Encounter (INDEPENDENT_AMBULATORY_CARE_PROVIDER_SITE_OTHER): Payer: Self-pay

## 2019-10-21 ENCOUNTER — Other Ambulatory Visit: Payer: Self-pay

## 2019-10-21 ENCOUNTER — Telehealth: Payer: Self-pay | Admitting: Cardiology

## 2019-10-21 ENCOUNTER — Other Ambulatory Visit: Payer: Self-pay | Admitting: Cardiology

## 2019-10-21 ENCOUNTER — Encounter: Payer: Self-pay | Admitting: Cardiology

## 2019-10-21 DIAGNOSIS — E875 Hyperkalemia: Secondary | ICD-10-CM

## 2019-10-21 LAB — BASIC METABOLIC PANEL
BUN/Creatinine Ratio: 39 — ABNORMAL HIGH (ref 12–28)
BUN: 64 mg/dL — ABNORMAL HIGH (ref 8–27)
CO2: 20 mmol/L (ref 20–29)
Calcium: 9.3 mg/dL (ref 8.7–10.3)
Chloride: 102 mmol/L (ref 96–106)
Creatinine, Ser: 1.65 mg/dL — ABNORMAL HIGH (ref 0.57–1.00)
GFR calc Af Amer: 32 mL/min/{1.73_m2} — ABNORMAL LOW (ref >59)
GFR calc non Af Amer: 27 mL/min/{1.73_m2} — ABNORMAL LOW (ref >59)
Glucose: 99 mg/dL (ref 65–99)
Potassium: 5.8 mmol/L (ref 3.5–5.2)
Sodium: 137 mmol/L (ref 134–144)

## 2019-10-21 LAB — BRAIN NATRIURETIC PEPTIDE: BNP: 2519 pg/mL — ABNORMAL HIGH (ref 0.0–100.0)

## 2019-10-21 NOTE — Telephone Encounter (Signed)
New Message    pts daughter Anderson Malta is calling about the pt and says she needs to clarify some information  She says her mom ( the pt) said she is suppose to come in for blood work today.  She also has questions about a medication, the pt told her she needed to stop taking the Ramipril but the pts daughter is not sure she is understanding what she needs to do      Please call

## 2019-10-21 NOTE — Telephone Encounter (Signed)
Patient got a message early this morning about her lab work. The message mentioned that her potassium was high, and that she needed to come in and have it re-checked. She only has her daughter to drive her, and just needs to know when to have it done.

## 2019-10-21 NOTE — Telephone Encounter (Signed)
Called the patient to make sure it was okay to speak to her daughter Joanette Gula since she is not on the dpr. She has given permission to speak with her.  Left message for the patient's daughter to call back.  Notes recorded by Martinique, Peter M, MD on 10/21/2019 at 8:16 AM EST  The following abnormalities are noted: Renal function is at baseline. Her potassium level is high  All other values are normal, stable or within acceptable limits.  Medication changes / Follow up labs / Other changes or recommendations:   Stop Ramipril. Repeat BMET in one week

## 2019-10-21 NOTE — Telephone Encounter (Signed)
Spoke to patient this morning lab results given.Advised to stop Ramipril.Repeat bmet in one week.

## 2019-10-21 NOTE — Progress Notes (Addendum)
I received a call from [facility] regarding abnormal lab value for Paige Cook:   Potassium 5.8 Creatinine is 1.6 (unchanged from baseline).   I recommend holding ACE inhibitor and having labs drawn again in the morning.  I attempted to call Paige Cook but received no answer. I left a voice mail.   Teresa Pelton, MD

## 2019-10-21 NOTE — Progress Notes (Signed)
I reached Ms. Hollan. She will hold ramipril and come for BMP today to follow up potassium level.

## 2019-10-21 NOTE — Telephone Encounter (Signed)
Returned call to patient no answer.LMTC. 

## 2019-10-22 NOTE — Telephone Encounter (Signed)
Spoke to daughter Lattie Haw. She was concerned why Ramapril was stopped with the pt's SOB. Stated that she thinks she is going to become more SOB without this medication. Advised the med was stopped do to dangerous high levels of potassium. Pt daughter verbalized understanding. Advised to get follow-up labs and to weight pt every day at same time. Advised that if she has a significant increase in weight gain to call back and get instructions with lasix. Pt daughter verbalized understanding.

## 2019-10-27 DIAGNOSIS — E875 Hyperkalemia: Secondary | ICD-10-CM | POA: Diagnosis not present

## 2019-10-27 LAB — BASIC METABOLIC PANEL
BUN/Creatinine Ratio: 35 — ABNORMAL HIGH (ref 12–28)
BUN: 72 mg/dL — ABNORMAL HIGH (ref 8–27)
CO2: 20 mmol/L (ref 20–29)
Calcium: 9.3 mg/dL (ref 8.7–10.3)
Chloride: 98 mmol/L (ref 96–106)
Creatinine, Ser: 2.08 mg/dL — ABNORMAL HIGH (ref 0.57–1.00)
GFR calc Af Amer: 24 mL/min/{1.73_m2} — ABNORMAL LOW (ref >59)
GFR calc non Af Amer: 21 mL/min/{1.73_m2} — ABNORMAL LOW (ref >59)
Glucose: 98 mg/dL (ref 65–99)
Potassium: 5.1 mmol/L (ref 3.5–5.2)
Sodium: 134 mmol/L (ref 134–144)

## 2019-10-28 ENCOUNTER — Other Ambulatory Visit: Payer: Self-pay

## 2019-10-28 DIAGNOSIS — E875 Hyperkalemia: Secondary | ICD-10-CM

## 2019-10-28 DIAGNOSIS — I10 Essential (primary) hypertension: Secondary | ICD-10-CM

## 2019-10-28 NOTE — Progress Notes (Signed)
bmetr

## 2019-10-30 ENCOUNTER — Telehealth: Payer: Self-pay | Admitting: Cardiology

## 2019-10-30 NOTE — Telephone Encounter (Signed)
New Message Patients daughter is calling in regards to some questions she has about the patient. Please advise.

## 2019-10-30 NOTE — Telephone Encounter (Signed)
Returned call to patient's daughter Anderson Malta.Patient gave me permission to talk with Anderson Malta.Recent lab results given.Lasix decreased to 40 mg daily 5 x week Mon-Fri.Advised repeat bmet 11/11/19.Stated she will bring her 11/10/19.

## 2019-11-10 DIAGNOSIS — E875 Hyperkalemia: Secondary | ICD-10-CM | POA: Diagnosis not present

## 2019-11-10 DIAGNOSIS — I10 Essential (primary) hypertension: Secondary | ICD-10-CM | POA: Diagnosis not present

## 2019-11-10 LAB — BASIC METABOLIC PANEL
BUN/Creatinine Ratio: 36 — ABNORMAL HIGH (ref 12–28)
BUN: 67 mg/dL — ABNORMAL HIGH (ref 8–27)
CO2: 20 mmol/L (ref 20–29)
Calcium: 9.3 mg/dL (ref 8.7–10.3)
Chloride: 100 mmol/L (ref 96–106)
Creatinine, Ser: 1.87 mg/dL — ABNORMAL HIGH (ref 0.57–1.00)
GFR calc Af Amer: 27 mL/min/{1.73_m2} — ABNORMAL LOW (ref >59)
GFR calc non Af Amer: 23 mL/min/{1.73_m2} — ABNORMAL LOW (ref >59)
Glucose: 109 mg/dL — ABNORMAL HIGH (ref 65–99)
Potassium: 4.9 mmol/L (ref 3.5–5.2)
Sodium: 137 mmol/L (ref 134–144)

## 2019-12-16 ENCOUNTER — Encounter: Payer: Self-pay | Admitting: Physician Assistant

## 2019-12-16 ENCOUNTER — Other Ambulatory Visit: Payer: Self-pay

## 2019-12-16 ENCOUNTER — Telehealth: Payer: Self-pay | Admitting: Cardiology

## 2019-12-16 ENCOUNTER — Ambulatory Visit (INDEPENDENT_AMBULATORY_CARE_PROVIDER_SITE_OTHER): Payer: Medicare Other | Admitting: Physician Assistant

## 2019-12-16 VITALS — BP 127/70 | HR 77 | Temp 97.0°F | Ht 64.0 in | Wt 119.8 lb

## 2019-12-16 DIAGNOSIS — I5023 Acute on chronic systolic (congestive) heart failure: Secondary | ICD-10-CM | POA: Diagnosis not present

## 2019-12-16 DIAGNOSIS — E785 Hyperlipidemia, unspecified: Secondary | ICD-10-CM

## 2019-12-16 DIAGNOSIS — I6523 Occlusion and stenosis of bilateral carotid arteries: Secondary | ICD-10-CM | POA: Diagnosis not present

## 2019-12-16 DIAGNOSIS — I428 Other cardiomyopathies: Secondary | ICD-10-CM

## 2019-12-16 DIAGNOSIS — I2510 Atherosclerotic heart disease of native coronary artery without angina pectoris: Secondary | ICD-10-CM | POA: Diagnosis not present

## 2019-12-16 DIAGNOSIS — Z79899 Other long term (current) drug therapy: Secondary | ICD-10-CM

## 2019-12-16 MED ORDER — FUROSEMIDE 40 MG PO TABS: ORAL_TABLET | ORAL | 3 refills | Status: DC

## 2019-12-16 NOTE — Telephone Encounter (Signed)
Spoke to patient's daughter Paige Cook.She stated mother has been sob for the past 2 days.She has gained 8 lbs within 1 week.Swelling in ankles no worse.She has had palpitations.She was thinks her potassium might be elevated.No chest pain.Appointment scheduled with Almyra Deforest PA this afternoon at 4:00 pm.Advised to check in 15 min early and bring a list of all medications.

## 2019-12-16 NOTE — Telephone Encounter (Signed)
    Pt c/o swelling: STAT is pt has developed SOB within 24 hours  1) How much weight have you gained and in what time span?   2) If swelling, where is the swelling located? ankles  3) Are you currently taking a fluid pill? yes  4) Are you currently SOB? yes  5) Do you have a log of your daily weights (if so, list)? 115 today, 108 on 12/22  6) Have you gained 3 pounds in a day or 5 pounds in a week?   7) Have you traveled recently? no

## 2019-12-16 NOTE — Telephone Encounter (Signed)
Returned call to [employer] no answer.[place].

## 2019-12-16 NOTE — Patient Instructions (Signed)
Medication Instructions:   INCREASE Lasix to 40 mg daily  *If you need a refill on your cardiac medications before your next appointment, please call your pharmacy*  Lab Work: Your physician recommends that you return for lab work tomorrow 12/17/19 and again in 5 days on your follow up appointment:  BMET  If you have labs (blood work) drawn today and your tests are completely normal, you will receive your results only by: Marland Kitchen MyChart Message (if you have MyChart) OR . A paper copy in the mail If you have any lab test that is abnormal or we need to change your treatment, we will call you to review the results.  Testing/Procedures: NONE ordered at this time of appointment   Follow-Up: At [facility], you and your health needs are our priority.  As part of our continuing mission to provide you with exceptional heart care, we have created designated Provider Care Teams.  These Care Teams include your primary Cardiologist (physician) and Advanced Practice Providers (APPs -  Physician Assistants and Nurse Practitioners) who all work together to provide you with the care you need, when you need it.  Your next appointment:   5-7 day(s)  The format for your next appointment:   In Person  Provider:   Almyra Deforest, PA-C  Other Instructions

## 2019-12-16 NOTE — Progress Notes (Signed)
Cardiology Office Note:    Date:  12/18/2019   ID:  [name], DOB 06-27-1928, MRN [id number]  PCP:  Burnard Bunting, MD  Cardiologist:  Peter Martinique, MD  Electrophysiologist:  None   Referring MD: Burnard Bunting, MD   Chief Complaint  Patient presents with  . Follow-up    seen for Dr. Martinique.    History of Present Illness:    Paige Cook is a 85 y.o. female with a hx of nonischemic cardiomyopathy, PACs, CAD, hyperlipidemia, and OSA on CPAP.  Previous cardiac catheterization on 01/10/2010 showed EF 30%, 40 to 50% proximal LAD lesion, 30% mid LAD lesion, otherwise normal left circumflex, and left main and RCA.  Patient had EF of 40% in 2012.  Subsequent echocardiogram in 2013 showed EF of 45%.  He has history of very frequent PACs on the Holter monitor however no atrial fibrillation was identified.  Patient was seen in 2019-02-26 and noted to have mild edema however otherwise asymptomatic.  In August he presented with symptom of PND.  Her Lasix was increased.  Echocardiogram obtained under shoulder reduction LVEF to 20% with moderate MR.  It was noted she was noncompliant with CPAP therapy.  Symptoms did not improve with increased Lasix.  She was seen by Dr. Caryl Comes who felt she was not a candidate for prophylactic ICD due to age and comorbidity.  Titration of medication was limited by CKD and her low BP.  Due to worsening renal function in August, Lasix was reduced to 3 times a week.  She later reported increased edema and shortness of breath.  Lasix was eventually increased back to daily dosing and symptom resolved.  Husband passed away this past 02-26-2023 after a prolonged illness.  More recently, she was seen by Dr. Martinique in November 2020, at which time she was stable from a heart failure perspective.  Recent drop in EF was attributed to nonischemic cardiomyopathy.  Patient presents to cardiology office today for evaluation of dyspnea.  Her daughter called the clinic earlier today  stating that her mother had 2 days of shortness of breath.  She also gained 8 pounds within 1 week and had a swelling in the ankles.  She presents to cardiology office today along with her daughter.  She does have JVD and a trace amount of lower extremity edema.  Instead of taking Lasix 40 mg from Monday through Friday, according to her daughter, she is actually taking 20 mg Lasix Monday Wednesday Friday and 40 mg Lasix on all the other days.  With her small size and low body mass, I am quite hesitant to be too aggressive when comes to diuresis.  I recommended increase Lasix to 40 mg daily instead.  She will need a basic metabolic panel today and also in 1 week.  Unfortunately, while Mrs. Vandergrift's daughter passed away around 3 AM this morning from cancer.  Family plans to discuss this with her in the next few days once she feels a little bit better.   Past Medical History:  Diagnosis Date  . Anemia   . Arthritis   . Bladder cancer ([place])    giant tumor  . CAD (coronary artery disease)   . Cardiac arrhythmia    benign  . Carotid bruit    left  . CHF (congestive heart failure) (HCC)    CHF with systolic dysfuntion ; dilated nonishemic cardiomyopathy  . Colon cancer ([place]) 1989  . Colon polyps   . Diverticulosis   .  Epistaxis   . Female bladder prolapse   . Gallstones   . Hypercholesteremia   . Hyponatremia    Probably secondary to her CHF  . OSA (obstructive sleep apnea) 04/29/2012  . SOB (shortness of breath)    Sometimes at night    Past Surgical History:  Procedure Laterality Date  . ABDOMINAL HYSTERECTOMY    . BLADDER SURGERY  2013   cancer removed  . bladder tact    . CARDIAC CATHETERIZATION     non obstruction CAD  . CHOLECYSTECTOMY    . 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 COLONOSCOPY      Current Medications: Current Meds  Medication Sig  . aspirin 81 MG chewable tablet Chew 81 mg by mouth daily with breakfast.  . carvedilol (COREG) 6.25 MG tablet TAKE 1 TABLET BY  MOUTH TWICE A DAY WITH A MEAL.  Marland Kitchen Cholecalciferol (VITAMIN D3) 50 MCG (2000 UT) TABS Take 50 mcg by mouth.  . Cyanocobalamin (B-12 PO) Take by mouth.  . furosemide (LASIX) 40 MG tablet Take 40 mg daily  . Magnesium Citrate 200 MG TABS Take 200 mg by mouth.  . vitamin C (ASCORBIC ACID) 250 MG tablet Take 500 mg by mouth daily with breakfast.   . [DISCONTINUED] calcium-vitamin D (OSCAL WITH D) 500-200 MG-UNIT per tablet Take 1 tablet by mouth every evening.   . [DISCONTINUED] furosemide (LASIX) 40 MG tablet Take 40 mg daily 5 x week Mon-Fri  . [DISCONTINUED] glucosamine-chondroitin 500-400 MG tablet Take 1 tablet by mouth daily with breakfast.   . [DISCONTINUED] polyethylene glycol (MIRALAX / GLYCOLAX) packet Take 17 g by mouth daily.     Allergies:   Penicillins and Tramadol   Social History   Socioeconomic History  . Marital status: Married    Spouse name: Not on file  . Number of children: 4  . Years of education: Not on file  . Highest education level: Not on file  Occupational History  . Occupation: retired Paramedic  Tobacco Use  . Smoking status: Never Smoker  . Smokeless tobacco: Never Used  Substance and Sexual Activity  . Alcohol use: No  . Drug use: No  . Sexual activity: Not Currently  Other Topics Concern  . Not on file  Social History Narrative   Married, 1 son and 3 daughters.    One coffee a day no alcohol no tobacco   05/05/2015   Social Determinants of Health   Financial Resource Strain:   . Difficulty of Paying Living Expenses: Not on file  Food Insecurity:   . Worried About Charity fundraiser in the Last Year: Not on file  . Ran Out of Food in the Last Year: Not on file  Transportation Needs:   . Lack of Transportation (Medical): Not on file  . Lack of Transportation (Non-Medical): Not on file  Physical Activity:   . Days of Exercise per Week: Not on file  . Minutes of Exercise per Session: Not on file  Stress:   . Feeling of Stress :  Not on file  Social Connections:   . Frequency of Communication with Friends and Family: Not on file  . Frequency of Social Gatherings with Friends and Family: Not on file  . Attends Religious Services: Not on file  . Active Member of Clubs or Organizations: Not on file  . Attends Archivist Meetings: Not on file  . Marital Status: Not on file     Family  History: The patient's family history includes Heart attack in her father and mother; Heart disease in her father; Heart failure (age of onset: 9) in her sister. There is no history of Stroke.  ROS:   Please see the history of present illness.     All other systems reviewed and are negative.  EKGs/Labs/Other Studies Reviewed:    The following studies were reviewed today:  Cath 01/10/2010    Echo 07/30/2019 IMPRESSIONS    1. The left ventricle has a visually estimated ejection fraction of 20%. The cavity size was severely dilated. Left ventricular diastolic Doppler parameters are consistent with restrictive filling. Elevated left ventricular end-diastolic pressure Left  ventrical global hypokinesis without regional wall motion abnormalities.  2. The right ventricle has normal systolic function. The cavity was normal. There is no increase in right ventricular wall thickness.  3. Left atrial size was moderately dilated.  4. Right atrial size was mildly dilated.  5. Moderate thickening of the mitral valve leaflet. Mild calcification of the mitral valve leaflet. There is mild mitral annular calcification present. Mitral valve regurgitation is moderate by color flow Doppler.  6. The aortic valve is tricuspid. Moderate thickening of the aortic valve. Sclerosis without any evidence of stenosis of the aortic valve. Aortic valve regurgitation is mild by color flow Doppler.  7. The aorta is normal unless otherwise noted.   EKG:  EKG is ordered today.  The ekg ordered today demonstrates sinus rhythm with occasional PVC.   Nonspecific ST changes.  Recent Labs: 10/20/2019: BNP 2,519.0 12/17/2019: BUN 67; Creatinine, Ser 1.93; Potassium 5.2; Sodium 138  Recent Lipid Panel    Component Value Date/Time   CHOL 149 11/03/2015 1042   TRIG 74 11/03/2015 1042   HDL 68 11/03/2015 1042   CHOLHDL 2.2 11/03/2015 1042   VLDL 15 11/03/2015 1042   LDLCALC 66 11/03/2015 1042   LDLDIRECT 152.9 03/06/2013 0924    Physical Exam:    VS:  BP 127/70   Pulse 77   Temp (!) 97 F (36.1 C)   Ht 5\' 4"  (1.626 m)   Wt 119 lb 12.8 oz (54.3 kg)   SpO2 93%   BMI 20.56 kg/m     Wt Readings from Last 3 Encounters:  12/16/19 119 lb 12.8 oz (54.3 kg)  10/20/19 112 lb (50.8 kg)  09/09/19 114 lb 12.8 oz (52.1 kg)     GEN:  Well nourished, well developed in no acute distress HEENT: Normal NECK: No JVD; No carotid bruits LYMPHATICS: No lymphadenopathy CARDIAC: RRR, no murmurs, rubs, gallops RESPIRATORY:  Clear to auscultation without rales, wheezing or rhonchi  ABDOMEN: Soft, non-tender, non-distended MUSCULOSKELETAL:  No edema; No deformity  SKIN: Warm and dry NEUROLOGIC:  Alert and oriented x 3 PSYCHIATRIC:  Normal affect   ASSESSMENT:    1. Acute on chronic systolic heart failure ([place])   2. Medication management   3. NICM (nonischemic cardiomyopathy) ([place])   4. Coronary artery disease involving native coronary artery of native heart without angina pectoris   5. Hyperlipidemia LDL goal <70    PLAN:    In order of problems listed above:  1. Acute on chronic systolic heart failure: Volume overloaded with 2+ ankle edema and elevated JVD.  She is on 20 mg Lasix every Monday Wednesday Friday and 40 mg Lasix on all the other days.  I will permanently increase her Lasix to 40 mg daily.  Given her small body mass, I am hesitant to be too aggressive but in  comes to diuresis  2. Nonischemic cardiomyopathy: Previous echocardiogram in March showed worsening EF of 20%.  She has very poor long-term prognosis.  If her volume  overload issue does not improve, I suspect she may qualify for palliative care.  It is quite unfortunate that her husband passed away in 01-30-23 and one of her daughter passed away around 3 AM this morning from cancer.  According to her family member, they have not told her yet.  They plan to tell her the loss of her daughter in a few days once she recovers from the current illness.  3. CAD: Denies any obvious chest pain  4. Hyperlipidemia: Continue statin therapy.   Medication Adjustments/Labs and Tests Ordered: Current medicines are reviewed at length with the patient today.  Concerns regarding medicines are outlined above.  Orders Placed This Encounter  Procedures  . Basic metabolic panel  . Basic metabolic panel  . EKG 12-Lead   Meds ordered this encounter  Medications  . furosemide (LASIX) 40 MG tablet    Sig: Take 40 mg daily    Dispense:  90 tablet    Refill:  3    Patient Instructions  Medication Instructions:   INCREASE Lasix to 40 mg daily  *If you need a refill on your cardiac medications before your next appointment, please call your pharmacy*  Lab Work: Your physician recommends that you return for lab work tomorrow 12/17/19 and again in 5 days on your follow up appointment:  BMET  If you have labs (blood work) drawn today and your tests are completely normal, you will receive your results only by: Marland Kitchen MyChart Message (if you have MyChart) OR . A paper copy in the mail If you have any lab test that is abnormal or we need to change your treatment, we will call you to review the results.  Testing/Procedures: NONE ordered at this time of appointment   Follow-Up: At [facility], you and your health needs are our priority.  As part of our continuing mission to provide you with exceptional heart care, we have created designated Provider Care Teams.  These Care Teams include your primary Cardiologist (physician) and Advanced Practice Providers (APPs -  Physician  Assistants and Nurse Practitioners) who all work together to provide you with the care you need, when you need it.  Your next appointment:   5-7 day(s)  The format for your next appointment:   In Person  Provider:   Almyra Deforest, PA-C  Other Instructions      Signed, Almyra Deforest, [place]  12/18/2019 10:50 PM    [place]

## 2019-12-18 ENCOUNTER — Encounter: Payer: Self-pay | Admitting: Physician Assistant

## 2019-12-18 LAB — BASIC METABOLIC PANEL
BUN/Creatinine Ratio: 35 — ABNORMAL HIGH (ref 12–28)
BUN: 67 mg/dL — ABNORMAL HIGH (ref 8–27)
CO2: 19 mmol/L — ABNORMAL LOW (ref 20–29)
Calcium: 8.9 mg/dL (ref 8.7–10.3)
Chloride: 103 mmol/L (ref 96–106)
Creatinine, Ser: 1.93 mg/dL — ABNORMAL HIGH (ref 0.57–1.00)
GFR calc Af Amer: 26 mL/min/{1.73_m2} — ABNORMAL LOW (ref >59)
GFR calc non Af Amer: 23 mL/min/{1.73_m2} — ABNORMAL LOW (ref >59)
Glucose: 127 mg/dL — ABNORMAL HIGH (ref 65–99)
Potassium: 5.2 mmol/L (ref 3.5–5.2)
Sodium: 138 mmol/L (ref 134–144)

## 2019-12-23 DIAGNOSIS — I129 Hypertensive chronic kidney disease with stage 1 through stage 4 chronic kidney disease, or unspecified chronic kidney disease: Secondary | ICD-10-CM | POA: Diagnosis not present

## 2019-12-23 DIAGNOSIS — Z1331 Encounter for screening for depression: Secondary | ICD-10-CM | POA: Diagnosis not present

## 2019-12-23 DIAGNOSIS — I10 Essential (primary) hypertension: Secondary | ICD-10-CM | POA: Diagnosis not present

## 2019-12-23 DIAGNOSIS — I509 Heart failure, unspecified: Secondary | ICD-10-CM | POA: Diagnosis not present

## 2019-12-23 DIAGNOSIS — M419 Scoliosis, unspecified: Secondary | ICD-10-CM | POA: Diagnosis not present

## 2019-12-23 DIAGNOSIS — I2510 Atherosclerotic heart disease of native coronary artery without angina pectoris: Secondary | ICD-10-CM | POA: Diagnosis not present

## 2019-12-23 DIAGNOSIS — N1831 Chronic kidney disease, stage 3a: Secondary | ICD-10-CM | POA: Diagnosis not present

## 2019-12-23 DIAGNOSIS — D638 Anemia in other chronic diseases classified elsewhere: Secondary | ICD-10-CM | POA: Diagnosis not present

## 2019-12-23 DIAGNOSIS — E785 Hyperlipidemia, unspecified: Secondary | ICD-10-CM | POA: Diagnosis not present

## 2019-12-24 ENCOUNTER — Ambulatory Visit (INDEPENDENT_AMBULATORY_CARE_PROVIDER_SITE_OTHER): Payer: Medicare Other | Admitting: Physician Assistant

## 2019-12-24 ENCOUNTER — Encounter: Payer: Self-pay | Admitting: Physician Assistant

## 2019-12-24 ENCOUNTER — Other Ambulatory Visit: Payer: Self-pay

## 2019-12-24 VITALS — BP 121/71 | HR 84 | Temp 97.3°F | Ht 65.0 in | Wt 114.0 lb

## 2019-12-24 DIAGNOSIS — E785 Hyperlipidemia, unspecified: Secondary | ICD-10-CM | POA: Diagnosis not present

## 2019-12-24 DIAGNOSIS — I2510 Atherosclerotic heart disease of native coronary artery without angina pectoris: Secondary | ICD-10-CM | POA: Diagnosis not present

## 2019-12-24 DIAGNOSIS — I5022 Chronic systolic (congestive) heart failure: Secondary | ICD-10-CM

## 2019-12-24 DIAGNOSIS — I428 Other cardiomyopathies: Secondary | ICD-10-CM

## 2019-12-24 NOTE — Patient Instructions (Signed)
Medication Instructions:  Your physician recommends that you continue on your current medications as directed. Please refer to the Current Medication list given to you today. *If you need a refill on your cardiac medications before your next appointment, please call your pharmacy*  Lab Work: NONE  If you have labs (blood work) drawn today and your tests are completely normal, you will receive your results only by: Marland Kitchen MyChart Message (if you have MyChart) OR . A paper copy in the mail If you have any lab test that is abnormal or we need to change your treatment, we will call you to review the results.  Testing/Procedures: NONE   Follow-Up: At [facility], you and your health needs are our priority.  As part of our continuing mission to provide you with exceptional heart care, we have created designated Provider Care Teams.  These Care Teams include your primary Cardiologist (physician) and Advanced Practice Providers (APPs -  Physician Assistants and Nurse Practitioners) who all work together to provide you with the care you need, when you need it.  Your next appointment:   FOLLOW UP AS SCHEDULED  The format for your next appointment:   In Person  Provider:   Peter Martinique, MD  Other Instructions

## 2019-12-24 NOTE — Progress Notes (Signed)
Cardiology Office Note:    Date:  12/26/2019   ID:  Paige Cook, DOB 11/22/1928, MRN [id number]  PCP:  Burnard Bunting, MD  Cardiologist:  Peter Martinique, MD  Electrophysiologist:  None   Referring MD: Burnard Bunting, MD   Chief Complaint  Patient presents with  . Follow-up    seen for Dr. Martinique    History of Present Illness:    Paige Cook is a 86 y.o. female with a hx of nonischemic cardiomyopathy, PACs, CAD, hyperlipidemia, and OSA on CPAP.  Previous cardiac catheterization on 01/10/2010 showed EF 30%, 40 to 50% proximal LAD lesion, 30% mid LAD lesion, otherwise normal left circumflex, and left main and RCA.  Patient had EF of 40% in 2012.  Subsequent echocardiogram in 2013 showed EF of 45%.  He has history of very frequent PACs on the Holter monitor however no atrial fibrillation was identified.    Patient was seen in 2019-02-13 and noted to have mild edema however otherwise asymptomatic.  In August he presented with symptom of PND.  Her Lasix was increased.  Echocardiogram obtained under shoulder reduction LVEF to 20% with moderate MR.  It was noted she was noncompliant with CPAP therapy.  Symptoms did not improve with increased Lasix.  She was seen by Dr. Caryl Comes who felt she was not a candidate for prophylactic ICD due to age and comorbidity.  Titration of medication was limited by CKD and her low BP.  Due to worsening renal function in August, Lasix was reduced to 3 times a week.  She later reported increased edema and shortness of breath.  Lasix was eventually increased back to daily dosing and symptom resolved.  Husband passed away this past 02/13/2023 after a prolonged illness.  I last saw the patient on 12/16/2019, her daughter just passed away at 3 AM that morning, she presented to the office for evaluation of dyspnea.  She was felt to be volume overloaded and gained about 8 pounds within 1 week.  I am quite hesitant to be too aggressive with her diuresis.  At the time she was  on 20 mg Lasix Monday Wednesday Friday and a 40 mg Lasix on all the other days.  I opted to increase her Lasix to 40 mg daily.  Patient presents today for cardiology office visit.  She denies any shortness of breath and chest pain.  She has lost significant weight after started on 40 mg daily of Lasix.  I plan to obtain a basic metabolic panel today.  If the renal function does worsen, I likely will place her on 20 mg Lasix on 1 day of the week and then 40 mg on all of the other 6 days.  However if her renal function is stable, I likely will keep her on the 40 mg daily of Lasix.   Past Medical History:  Diagnosis Date  . Anemia   . Arthritis   . Bladder cancer ([place])    giant tumor  . CAD (coronary artery disease)   . Cardiac arrhythmia    benign  . Carotid bruit    left  . CHF (congestive heart failure) (HCC)    CHF with systolic dysfuntion ; dilated nonishemic cardiomyopathy  . Colon cancer ([place]) 1989  . Colon polyps   . Diverticulosis   . Epistaxis   . Female bladder prolapse   . Gallstones   . Hypercholesteremia   . Hyponatremia    Probably secondary to her CHF  . OSA (obstructive  sleep apnea) 04/29/2012  . SOB (shortness of breath)    Sometimes at night    Past Surgical History:  Procedure Laterality Date  . ABDOMINAL HYSTERECTOMY    . BLADDER SURGERY  2013   cancer removed  . bladder tact    . CARDIAC CATHETERIZATION     non obstruction CAD  . CHOLECYSTECTOMY    . 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 COLONOSCOPY      Current Medications: Current Meds  Medication Sig  . aspirin 81 MG chewable tablet Chew 81 mg by mouth daily with breakfast.  . carvedilol (COREG) 6.25 MG tablet TAKE 1 TABLET BY MOUTH TWICE A DAY WITH A MEAL.  Marland Kitchen Cholecalciferol (VITAMIN D3) 50 MCG (2000 UT) TABS Take 50 mcg by mouth.  . Cyanocobalamin (B-12 PO) Take by mouth 3 (three) times a week.   . furosemide (LASIX) 40 MG tablet Take 40 mg daily  . Magnesium Citrate 200 MG TABS Take 200 mg  by mouth.  . vitamin C (ASCORBIC ACID) 250 MG tablet Take 500 mg by mouth daily with breakfast.      Allergies:   Penicillins and Tramadol   Social History   Socioeconomic History  . Marital status: Married    Spouse name: Not on file  . Number of children: 4  . Years of education: Not on file  . Highest education level: Not on file  Occupational History  . Occupation: retired Paramedic  Tobacco Use  . Smoking status: Never Smoker  . Smokeless tobacco: Never Used  Substance and Sexual Activity  . Alcohol use: No  . Drug use: No  . Sexual activity: Not Currently  Other Topics Concern  . Not on file  Social History Narrative   Married, 1 son and 3 daughters.    One coffee a day no alcohol no tobacco   05/05/2015   Social Determinants of Health   Financial Resource Strain:   . Difficulty of Paying Living Expenses: Not on file  Food Insecurity:   . Worried About Charity fundraiser in the Last Year: Not on file  . Ran Out of Food in the Last Year: Not on file  Transportation Needs:   . Lack of Transportation (Medical): Not on file  . Lack of Transportation (Non-Medical): Not on file  Physical Activity:   . Days of Exercise per Week: Not on file  . Minutes of Exercise per Session: Not on file  Stress:   . Feeling of Stress : Not on file  Social Connections:   . Frequency of Communication with Friends and Family: Not on file  . Frequency of Social Gatherings with Friends and Family: Not on file  . Attends Religious Services: Not on file  . Active Member of Clubs or Organizations: Not on file  . Attends Archivist Meetings: Not on file  . Marital Status: Not on file     Family History: The patient's family history includes Heart attack in her father and mother; Heart disease in her father; Heart failure (age of onset: 109) in her sister. There is no history of Stroke.  ROS:   Please see the history of present illness.     All other systems  reviewed and are negative.  EKGs/Labs/Other Studies Reviewed:    The following studies were reviewed today:  Cath 01/10/2010    Echo 07/30/2019 IMPRESSIONS   1. The left ventricle has a visually estimated ejection fraction of 20%. The  cavity size was severely dilated. Left ventricular diastolic Doppler parameters are consistent with restrictive filling. Elevated left ventricular end-diastolic pressure Left  ventrical global hypokinesis without regional wall motion abnormalities. 2. The right ventricle has normal systolic function. The cavity was normal. There is no increase in right ventricular wall thickness. 3. Left atrial size was moderately dilated. 4. Right atrial size was mildly dilated. 5. Moderate thickening of the mitral valve leaflet. Mild calcification of the mitral valve leaflet. There is mild mitral annular calcification present. Mitral valve regurgitation is moderate by color flow Doppler. 6. The aortic valve is tricuspid. Moderate thickening of the aortic valve. Sclerosis without any evidence of stenosis of the aortic valve. Aortic valve regurgitation is mild by color flow Doppler. 7. The aorta is normal unless otherwise noted.  EKG:  EKG is not ordered today.   Recent Labs: 10/20/2019: BNP 2,519.0 12/24/2019: BUN 61; Creatinine, Ser 1.89; Potassium 5.2; Sodium 141  Recent Lipid Panel    Component Value Date/Time   CHOL 149 11/03/2015 1042   TRIG 74 11/03/2015 1042   HDL 68 11/03/2015 1042   CHOLHDL 2.2 11/03/2015 1042   VLDL 15 11/03/2015 1042   LDLCALC 66 11/03/2015 1042   LDLDIRECT 152.9 03/06/2013 0924    Physical Exam:    VS:  BP 121/71   Pulse 84   Temp (!) 97.3 F (36.3 C) (Temporal)   Ht 5\' 5"  (1.651 m)   Wt 114 lb (51.7 kg)   SpO2 99%   BMI 18.97 kg/m     Wt Readings from Last 3 Encounters:  12/24/19 114 lb (51.7 kg)  12/16/19 119 lb 12.8 oz (54.3 kg)  10/20/19 112 lb (50.8 kg)     GEN:  Well nourished, well developed in no acute  distress HEENT: Normal NECK: No JVD; No carotid bruits LYMPHATICS: No lymphadenopathy CARDIAC: RRR, no murmurs, rubs, gallops RESPIRATORY:  Clear to auscultation without rales, wheezing or rhonchi  ABDOMEN: Soft, non-tender, non-distended MUSCULOSKELETAL:  No edema; No deformity  SKIN: Warm and dry NEUROLOGIC:  Alert and oriented x 3 PSYCHIATRIC:  Normal affect   ASSESSMENT:    1. Chronic systolic HF (heart failure) ([place])   2. NICM (nonischemic cardiomyopathy) ([place])   3. Coronary artery disease involving native coronary artery of native heart without angina pectoris   4. Hyperlipidemia LDL goal <70    PLAN:    In order of problems listed above:  1. Chronic systolic heart failure: Obtain basic metabolic panel.  She has lost significant weight after increasing Lasix to 40 mg daily.  If renal function worsens on today's lab work, I plan to decrease her Lasix to 20 mg 1 day a week and 40 mg on all other days  2. Nonischemic cardiomyopathy: Previous echocardiogram in March showed worsening EF of 20%.  Poor long-term prognosis.  3. CAD: No obvious chest pain  4. Hyperlipidemia: Last lipid panel obtained in epic system dated back to 2016, overdue for lab work.   Medication Adjustments/Labs and Tests Ordered: Current medicines are reviewed at length with the patient today.  Concerns regarding medicines are outlined above.  Orders Placed This Encounter  Procedures  . Basic Metabolic Panel (BMET)   No orders of the defined types were placed in this encounter.   Patient Instructions  Medication Instructions:  Your physician recommends that you continue on your current medications as directed. Please refer to the Current Medication list given to you today. *If you need a refill on your cardiac medications before  your next appointment, please call your pharmacy*  Lab Work: NONE  If you have labs (blood work) drawn today and your tests are completely normal, you will receive your  results only by: Marland Kitchen MyChart Message (if you have MyChart) OR . A paper copy in the mail If you have any lab test that is abnormal or we need to change your treatment, we will call you to review the results.  Testing/Procedures: NONE   Follow-Up: At [facility], you and your health needs are our priority.  As part of our continuing mission to provide you with exceptional heart care, we have created designated Provider Care Teams.  These Care Teams include your primary Cardiologist (physician) and Advanced Practice Providers (APPs -  Physician Assistants and Nurse Practitioners) who all work together to provide you with the care you need, when you need it.  Your next appointment:   FOLLOW UP AS SCHEDULED  The format for your next appointment:   In Person  Provider:   Peter Martinique, MD  Other Instructions      Signed, Almyra Deforest, [place]  12/26/2019 11:44 PM    [place]

## 2019-12-25 LAB — BASIC METABOLIC PANEL
BUN/Creatinine Ratio: 32 — ABNORMAL HIGH (ref 12–28)
BUN: 61 mg/dL — ABNORMAL HIGH (ref 8–27)
CO2: 22 mmol/L (ref 20–29)
Calcium: 9.3 mg/dL (ref 8.7–10.3)
Chloride: 101 mmol/L (ref 96–106)
Creatinine, Ser: 1.89 mg/dL — ABNORMAL HIGH (ref 0.57–1.00)
GFR calc Af Amer: 27 mL/min/{1.73_m2} — ABNORMAL LOW (ref >59)
GFR calc non Af Amer: 23 mL/min/{1.73_m2} — ABNORMAL LOW (ref >59)
Glucose: 98 mg/dL (ref 65–99)
Potassium: 5.2 mmol/L (ref 3.5–5.2)
Sodium: 141 mmol/L (ref 134–144)

## 2019-12-26 ENCOUNTER — Encounter: Payer: Self-pay | Admitting: Physician Assistant

## 2019-12-28 ENCOUNTER — Telehealth: Payer: Self-pay

## 2019-12-28 DIAGNOSIS — I5022 Chronic systolic (congestive) heart failure: Secondary | ICD-10-CM

## 2019-12-28 DIAGNOSIS — E875 Hyperkalemia: Secondary | ICD-10-CM

## 2019-12-28 DIAGNOSIS — Z79899 Other long term (current) drug therapy: Secondary | ICD-10-CM

## 2019-12-28 NOTE — Telephone Encounter (Addendum)
Left a voice message for the patient to give our office a call back to discuss recent lab results and to have repeat labs in 2 weeks.  ----- Message from [place], [place] sent at 12/28/2019 10:27 AM EST ----- Kidney function and electrolyte stable, previous she has dehydration issue on the 40mg  daily of lasix, however now it appears she is tolerating this dose. Recommend repeat BMET in 2 weeks.

## 2019-12-29 NOTE — Telephone Encounter (Signed)
Pts daughter Anderson Malta advised her BMET results per Almyra Deforest PA and will return for labs prior to her appt with Dr. Martinique 01/18/20.

## 2019-12-29 NOTE — Addendum Note (Signed)
Addended by: Stephani Police on: 12/29/2019 03:41 PM   Modules accepted: Orders

## 2019-12-29 NOTE — Telephone Encounter (Signed)
Patient's daughter Anderson Malta calling back for results.

## 2020-01-11 ENCOUNTER — Encounter: Payer: Self-pay | Admitting: Podiatry

## 2020-01-11 ENCOUNTER — Ambulatory Visit (INDEPENDENT_AMBULATORY_CARE_PROVIDER_SITE_OTHER): Payer: Medicare Other | Admitting: Podiatry

## 2020-01-11 ENCOUNTER — Other Ambulatory Visit: Payer: Self-pay

## 2020-01-11 ENCOUNTER — Ambulatory Visit (INDEPENDENT_AMBULATORY_CARE_PROVIDER_SITE_OTHER): Payer: Medicare Other

## 2020-01-11 DIAGNOSIS — R609 Edema, unspecified: Secondary | ICD-10-CM | POA: Diagnosis not present

## 2020-01-11 DIAGNOSIS — M7751 Other enthesopathy of right foot: Secondary | ICD-10-CM | POA: Diagnosis not present

## 2020-01-11 DIAGNOSIS — M7731 Calcaneal spur, right foot: Secondary | ICD-10-CM

## 2020-01-11 DIAGNOSIS — I2510 Atherosclerotic heart disease of native coronary artery without angina pectoris: Secondary | ICD-10-CM | POA: Diagnosis not present

## 2020-01-11 DIAGNOSIS — M79671 Pain in right foot: Secondary | ICD-10-CM

## 2020-01-11 MED ORDER — DICLOFENAC SODIUM 1 % EX GEL: 2.0000 g | Freq: Two times a day (BID) | CUTANEOUS | 2 refills | Status: DC

## 2020-01-11 NOTE — Progress Notes (Signed)
Cardiology Office Note:    Date:  01/18/2020   ID:  Paige Cook, DOB 1928/07/31, MRN [id number]  PCP:  Burnard Bunting, MD  Cardiologist:  Addis Bennie Martinique, MD  Electrophysiologist:  None   Referring MD: Burnard Bunting, MD   Chief Complaint  Patient presents with  . Congestive Heart Failure    History of Present Illness:    Paige Cook is a 86 y.o. female with a hx of nonischemic cardiomyopathy, PACs, CAD, hyperlipidemia, and OSA on CPAP.  She is seen for follow up CHF. She is seen with her daughter. Previous cardiac catheterization on 01/10/2010 showed EF 30%, 40 to 50% proximal LAD lesion, 30% mid LAD lesion, otherwise normal left circumflex, and left main and RCA.  Patient had EF of 40% in 2012.  Subsequent echocardiogram in 2013 showed EF of 45%.  He has history of very frequent PACs on the Holter monitor however no atrial fibrillation was identified.    Patient was seen in February 2020 and noted to have mild edema however otherwise asymptomatic.  In August he presented with symptom of PND.  Her Lasix was increased.  Echocardiogram obtained showed decreased LVEF to 20% with moderate MR.  It was noted she was noncompliant with CPAP therapy.  Symptoms did not improve with increased Lasix.  She was seen by Dr. Caryl Comes who felt she was not a candidate for prophylactic ICD due to age and comorbidity.  Titration of medication was limited by CKD and her low BP.  Due to worsening renal function in August, Lasix was reduced to 3 times a week.  She later reported increased edema and shortness of breath.  Lasix was eventually increased back to daily dosing and symptoms improved. Was seen by Almyra Deforest PA-C on 12/16/2019.  She was felt to be volume overloaded and gained about 8 pounds within 1 week.  He increased  her Lasix to 40 mg daily. She initially improved but then had more fluid retention. Her lasix was increased to 60 mg for three days.   On follow up she complains of breathlessness and  increased edema in legs and some abdominal swelling. No cough. No chest pain. States her CPAP isn't working well "blowing too much air". Is scheduled for follow up with her sleep physician on Feb 10. She has been more diligent with sodium restriction over the past week with daughter's help.    Past Medical History:  Diagnosis Date  . Anemia   . Arthritis   . Bladder cancer ([place])    giant tumor  . CAD (coronary artery disease)   . Cardiac arrhythmia    benign  . Carotid bruit    left  . CHF (congestive heart failure) (HCC)    CHF with systolic dysfuntion ; dilated nonishemic cardiomyopathy  . Colon cancer ([place]) 1989  . Colon polyps   . Diverticulosis   . Epistaxis   . Female bladder prolapse   . Gallstones   . Hypercholesteremia   . Hyponatremia    Probably secondary to her CHF  . OSA (obstructive sleep apnea) 04/29/2012  . SOB (shortness of breath)    Sometimes at night    Past Surgical History:  Procedure Laterality Date  . ABDOMINAL HYSTERECTOMY    . BLADDER SURGERY  2013   cancer removed  . bladder tact    . CARDIAC CATHETERIZATION     non obstruction CAD  . CHOLECYSTECTOMY    . 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  COLONOSCOPY      Current Medications: No outpatient medications have been marked as taking for the 01/18/20 encounter (Office Visit) with Martinique, Letisha Yera M, MD.     Allergies:   Penicillins and Tramadol   Social History   Socioeconomic History  . Marital status: Married    Spouse name: Not on file  . Number of children: 4  . Years of education: Not on file  . Highest education level: Not on file  Occupational History  . Occupation: retired Paramedic  Tobacco Use  . Smoking status: Never Smoker  . Smokeless tobacco: Never Used  Substance and Sexual Activity  . Alcohol use: No  . Drug use: No  . Sexual activity: Not Currently  Other Topics Concern  . Not on file  Social History Narrative   Married, 1 son and 3 daughters.    One  coffee a day no alcohol no tobacco   05/05/2015   Social Determinants of Health   Financial Resource Strain:   . Difficulty of Paying Living Expenses: Not on file  Food Insecurity:   . Worried About Charity fundraiser in the Last Year: Not on file  . Ran Out of Food in the Last Year: Not on file  Transportation Needs:   . Lack of Transportation (Medical): Not on file  . Lack of Transportation (Non-Medical): Not on file  Physical Activity:   . Days of Exercise per Week: Not on file  . Minutes of Exercise per Session: Not on file  Stress:   . Feeling of Stress : Not on file  Social Connections:   . Frequency of Communication with Friends and Family: Not on file  . Frequency of Social Gatherings with Friends and Family: Not on file  . Attends Religious Services: Not on file  . Active Member of Clubs or Organizations: Not on file  . Attends Archivist Meetings: Not on file  . Marital Status: Not on file     Family History: The patient's family history includes Heart attack in her father and mother; Heart disease in her father; Heart failure (age of onset: 37) in her sister. There is no history of Stroke.  ROS:   Please see the history of present illness.     All other systems reviewed and are negative.  EKGs/Labs/Other Studies Reviewed:    The following studies were reviewed today:  Cath 01/10/2010    Echo 07/30/2019 IMPRESSIONS   1. The left ventricle has a visually estimated ejection fraction of 20%. The cavity size was severely dilated. Left ventricular diastolic Doppler parameters are consistent with restrictive filling. Elevated left ventricular end-diastolic pressure Left  ventrical global hypokinesis without regional wall motion abnormalities. 2. The right ventricle has normal systolic function. The cavity was normal. There is no increase in right ventricular wall thickness. 3. Left atrial size was moderately dilated. 4. Right atrial size was  mildly dilated. 5. Moderate thickening of the mitral valve leaflet. Mild calcification of the mitral valve leaflet. There is mild mitral annular calcification present. Mitral valve regurgitation is moderate by color flow Doppler. 6. The aortic valve is tricuspid. Moderate thickening of the aortic valve. Sclerosis without any evidence of stenosis of the aortic valve. Aortic valve regurgitation is mild by color flow Doppler. 7. The aorta is normal unless otherwise noted.  EKG:  EKG is not ordered today.   Recent Labs: 10/20/2019: BNP 2,519.0 12/24/2019: BUN 61; Creatinine, Ser 1.89; Potassium 5.2; Sodium 141  Recent Lipid Panel  Component Value Date/Time   CHOL 149 11/03/2015 1042   TRIG 74 11/03/2015 1042   HDL 68 11/03/2015 1042   CHOLHDL 2.2 11/03/2015 1042   VLDL 15 11/03/2015 1042   LDLCALC 66 11/03/2015 1042   LDLDIRECT 152.9 03/06/2013 0924   Dated 06/22/19: cholesterol 178, triglycerides 51, HDL 64, LDL 104. LFTs normal. Hgb 11. TSH normal Dated 12/24/19: creatinine 1.89. potassium 5.2.   Physical Exam:    VS:  BP 128/72   Pulse 67   Temp (!) 96.8 F (36 C)   Ht 5\' 3"  (1.6 m)   Wt 117 lb 9.6 oz (53.3 kg)   SpO2 99%   BMI 20.83 kg/m     Wt Readings from Last 3 Encounters:  01/18/20 117 lb 9.6 oz (53.3 kg)  01/14/20 120 lb 3.2 oz (54.5 kg)  12/24/19 114 lb (51.7 kg)     GEN:  Elderly WF  in no acute distress HEENT: Normal NECK: + JVD; No carotid bruits LYMPHATICS: No lymphadenopathy CARDIAC: RRR, no murmurs, rubs, gallops RESPIRATORY:  Clear to auscultation without rales, wheezing or rhonchi  ABDOMEN: Soft, non-tender, non-distended MUSCULOSKELETAL:  2+ edema; marked scoliosis SKIN: Warm and dry NEUROLOGIC:  Alert and oriented x 3 PSYCHIATRIC:  Normal affect   ASSESSMENT:    1. Acute on chronic systolic heart failure ([place])   2. NICM (nonischemic cardiomyopathy) ([place])   3. Stage 3b chronic kidney disease   4. Essential hypertension    PLAN:    In order  of problems listed above:  1. Acute on Chronic systolic heart failure: weight is up and more edema despite increase in lasix to 40 mg daily. Will increase lasix to 40 mg bid. Start Imdur 30 mg daily and hydralazine 5 mg tid. Not a candidate for ACEi/ARV,ARNI, or spironolactone due to CKD.  Sodium restriction. Will update labs in 2 weeks. Follow up 4 weeks.  2. Nonischemic cardiomyopathy: Previous echocardiogram in March 2020 showed worsening EF of 20%.  Poor long-term prognosis.  3. CAD: No  chest pain  4. Hyperlipidemia: Last lipid panel obtained in epic system dated back to 2016- will update fasting labs.   5. CKD stage 3b. Monitor with medication changes.    Medication Adjustments/Labs and Tests Ordered: Current medicines are reviewed at length with the patient today.  Concerns regarding medicines are outlined above.  No orders of the defined types were placed in this encounter.  No orders of the defined types were placed in this encounter.   Patient Instructions  Continue sodium restriction  Increase lasix to 40 mg twice a day  Start Imdur (isosorbide) 30 mg daily  Start hydralazine 5 mg three times a day     Signed, Keymani Glynn Martinique, MD  01/18/2020 11:51 AM    [facility]

## 2020-01-12 ENCOUNTER — Telehealth: Payer: Self-pay | Admitting: *Deleted

## 2020-01-12 DIAGNOSIS — M7751 Other enthesopathy of right foot: Secondary | ICD-10-CM

## 2020-01-12 DIAGNOSIS — M7731 Calcaneal spur, right foot: Secondary | ICD-10-CM

## 2020-01-12 NOTE — Progress Notes (Signed)
Subjective:   Patient ID: Paige Cook, female   DOB: 86 y.o.   MRN: [id number]   HPI 86 year old female presents the office today with her daughter for concerns of pain to the back of the right heel which is been on the for last 3 and half weeks.  She has pain to the back of the heel and she has been wearing a surgical shoe which has been helpful but she has to wear an insert inside the shoe as well as a pad inside the shoe.  She denies any recent injury or falls.  She has noticed swelling to the back of the heel.  She states it only hurts if she is walking.  No other treatment.  No other concerns today.   Review of Systems  All other systems reviewed and are negative.  Past Medical History:  Diagnosis Date  . Anemia   . Arthritis   . Bladder cancer ([place])    giant tumor  . CAD (coronary artery disease)   . Cardiac arrhythmia    benign  . Carotid bruit    left  . CHF (congestive heart failure) (HCC)    CHF with systolic dysfuntion ; dilated nonishemic cardiomyopathy  . Colon cancer ([place]) 1989  . Colon polyps   . Diverticulosis   . Epistaxis   . Female bladder prolapse   . Gallstones   . Hypercholesteremia   . Hyponatremia    Probably secondary to her CHF  . OSA (obstructive sleep apnea) 04/29/2012  . SOB (shortness of breath)    Sometimes at night    Past Surgical History:  Procedure Laterality Date  . ABDOMINAL HYSTERECTOMY    . BLADDER SURGERY  2013   cancer removed  . bladder tact    . CARDIAC CATHETERIZATION     non obstruction CAD  . CHOLECYSTECTOMY    . 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 COLONOSCOPY       Current Outpatient Medications:  .  aspirin 81 MG chewable tablet, Chew 81 mg by mouth daily with breakfast., Disp: , Rfl:  .  carvedilol (COREG) 6.25 MG tablet, TAKE 1 TABLET BY MOUTH TWICE A DAY WITH A MEAL., Disp: 180 tablet, Rfl: 2 .  Cholecalciferol (VITAMIN D3) 50 MCG (2000 UT) TABS, Take 50 mcg by mouth., Disp: , Rfl:  .  Cyanocobalamin (B-12  PO), Take by mouth 3 (three) times a week. , Disp: , Rfl:  .  diclofenac Sodium (VOLTAREN) 1 % GEL, Apply 2 g topically 2 (two) times daily. As needed, Disp: 100 g, Rfl: 2 .  furosemide (LASIX) 40 MG tablet, Take 40 mg daily, Disp: 90 tablet, Rfl: 3 .  Magnesium Citrate 200 MG TABS, Take 200 mg by mouth., Disp: , Rfl:  .  vitamin C (ASCORBIC ACID) 250 MG tablet, Take 500 mg by mouth daily with breakfast. , Disp: , Rfl:   Allergies  Allergen Reactions  . Penicillins Hives    Has patient had a PCN reaction causing immediate rash, facial/tongue/throat swelling, SOB or lightheadedness with hypotension: Yes Has patient had a PCN reaction causing severe rash involving mucus membranes or skin necrosis: Yes Has patient had a PCN reaction that required hospitalization No Has patient had a PCN reaction occurring within the last 10 years: No If all of the above answers are "NO", then may proceed with Cephalosporin use.   . Tramadol Nausea Only and Other (See Comments)    shakes  Objective:  Physical Exam  General: AAO x3, NAD  Dermatological: No open lesions identified.  Vascular: Dorsalis Pedis artery and Posterior Tibial artery pedal pulses are 2/4 bilateral with immedate capillary fill time.  There is no pain with calf compression, swelling, warmth, erythema.   Neruologic: Grossly intact via light touch bilateral.  Musculoskeletal: There is tenderness palpation directly on the posterior aspect of the calcaneus.  There is localized edema to this area and faint erythema.  No increase in warmth.  No significant discomfort on the Achilles tendon Thompson test is negative.  There is no pain with lateral compression of calcaneus.  On exam there is no tenderness on the course or insertion of the plantar fascia but subjectively she does get some discomfort to the plantar heel.  No pain in the arch of the foot today.  No pain to the foot.  No pain to the ankle.  Muscular strength 5/5 in all  groups tested bilateral.  Gait: Unassisted, Nonantalgic.       Assessment:   86 year old female right posterior heel spur, bursitis     Plan:  -Treatment options discussed including all alternatives, risks, and complications -Etiology of symptoms were discussed -X-rays were obtained and reviewed with the patient.  No spurring, calcifications present.  There is no evidence of acute fracture today. -Prescribe Voltaren gel for her to use very sparingly.  I also dispensed Biofreeze that she can use on a more frequent basis.  Only use the Voltaren gel for short amount of time and a small amount.  She cannot tolerate oral anti-inflammatories or prednisone. -Dispensed gel offloading pad. -We will work on trying to get physical therapy to come to her house.  Due to her congestive heart failure she needs to be ambulatory and over the last couple of weeks she has not been doing much walking because of her foot.  Hopefully physical therapy will help with her foot as well as gait training.  Return in about 2 weeks (around 01/25/2020).  Trula Slade DPM

## 2020-01-12 NOTE — Telephone Encounter (Signed)
Faxed required form, demographics and clinicals to [facility] with note that may offer self pay option.

## 2020-01-12 NOTE — Telephone Encounter (Signed)
-----   Message from Trula Slade, DPM sent at 01/11/2020  3:52 PM EST ----- Can you please see if we can get home PT for her for achilles tendonitis and gait training? Thanks.

## 2020-01-13 ENCOUNTER — Telehealth: Payer: Self-pay | Admitting: Cardiology

## 2020-01-13 NOTE — Telephone Encounter (Signed)
sPOKE WITH PT 'S DAUGHTER AND SINCE Saturday HAS HAD SOB DID NOT SLEEPWELL SAT, SUN ,OR  MON SEEMED SOME BETTER LAST NIGHT ALSO NOTES LOWER EXTREMITY EDEMA AND THAT IS SOME BETTER TODAY AS WELL PT'S LASIX WAS RECENTLY INCREASED TO WHOLE TAB AT LAST OFFICE VISIT WITH HAO wILL CONTINUE TO MONITOR AND IF  WORSENS WILL CALL BACK WILL FORWARD TO DR Martinique FOR REVIEW PT HAS APPT WITH DR Martinique ON 01/18/20./CY

## 2020-01-13 NOTE — Telephone Encounter (Signed)
Noted. Will follow up at [place]

## 2020-01-13 NOTE — Telephone Encounter (Signed)
New Message   Patients daughter Paige Cook would like to attend appt with her mother on 01/18/20

## 2020-01-14 ENCOUNTER — Telehealth: Payer: Self-pay | Admitting: Cardiology

## 2020-01-14 ENCOUNTER — Ambulatory Visit (INDEPENDENT_AMBULATORY_CARE_PROVIDER_SITE_OTHER): Payer: Medicare Other | Admitting: General Practice

## 2020-01-14 ENCOUNTER — Encounter: Payer: Self-pay | Admitting: General Practice

## 2020-01-14 ENCOUNTER — Other Ambulatory Visit: Payer: Self-pay

## 2020-01-14 VITALS — BP 121/69 | HR 86 | Ht 65.0 in | Wt 120.2 lb

## 2020-01-14 DIAGNOSIS — I2510 Atherosclerotic heart disease of native coronary artery without angina pectoris: Secondary | ICD-10-CM

## 2020-01-14 DIAGNOSIS — I428 Other cardiomyopathies: Secondary | ICD-10-CM | POA: Diagnosis not present

## 2020-01-14 DIAGNOSIS — I5022 Chronic systolic (congestive) heart failure: Secondary | ICD-10-CM

## 2020-01-14 MED ORDER — FUROSEMIDE 40 MG PO TABS: ORAL_TABLET | ORAL | 3 refills | Status: DC

## 2020-01-14 NOTE — Patient Instructions (Addendum)
Medication Instructions:  Take furosemide 40mg  tonight then... Take furosemide 60mg  x3days then back to 40mg  daily If you need a refill on your cardiac medications before your next appointment, please call your pharmacy.  Special Instructions: PLEASE READ AND FOLLOW SALTY 6 ATTACHED  TAKE LOG DAILY WEIGHTS; CALL OFFICE IF WEIGHT GAIN IS MORE THAN 3 lb/DAY AND 5lb/WEEK.  Reduce your risk of getting COVID-19 With your heart disease it is especially important for people at increased risk of severe illness from COVID-19, and those who live with them, to protect themselves from getting COVID-19. The best way to protect yourself and to help reduce the spread of the virus that causes COVID-19 is to: Marland Kitchen Limit your interactions with other people as much as possible. . Take precautions to prevent getting COVID-19 when you do interact with others. If you start feeling sick and think you may have COVID-19, get in touch with your healthcare provider within 24 hours.  Follow-Up: KEEP SCHEDULED FOLLOW UP    In Person Peter Martinique, MD.    At [facility], you and your health needs are our priority.  As part of our continuing mission to provide you with exceptional heart care, we have created designated Provider Care Teams.  These Care Teams include your primary Cardiologist (physician) and Advanced Practice Providers (APPs -  Physician Assistants and Nurse Practitioners) who all work together to provide you with the care you need, when you need it.  Thank you for choosing [facility] at [facility]!!

## 2020-01-14 NOTE — Progress Notes (Signed)
Cardiology Clinic Note   Patient Name: Paige Cook Date of Encounter: 01/14/2020  Primary Care Provider:  Burnard Bunting, MD Primary Cardiologist:  Peter Martinique, MD  Patient Profile    Paige Cook 86 year old female presents today for evaluation of her increased work of breathing.  Past Medical History    Past Medical History:  Diagnosis Date  . Anemia   . Arthritis   . Bladder cancer ([place])    giant tumor  . CAD (coronary artery disease)   . Cardiac arrhythmia    benign  . Carotid bruit    left  . CHF (congestive heart failure) (H[facility])    CHF with systolic dysfuntion ; dilated nonishemic cardiomyopathy  . Colon cancer ([place]) 1989  . Colon polyps   . Diverticulosis   . Epistaxis   . Female bladder prolapse   . Gallstones   . Hypercholesteremia   . Hyponatremia    Probably secondary to her CHF  . OSA (obstructive sleep apnea) 04/29/2012  . SOB (shortness of breath)    Sometimes at night   Past Surgical History:  Procedure Laterality Date  . ABDOMINAL HYSTERECTOMY    . BLADDER SURGERY  2013   cancer removed  . bladder tact    . CARDIAC CATHETERIZATION     non obstruction CAD  . CHOLECYSTECTOMY    . 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 COLONOSCOPY      Allergies  Allergies  Allergen Reactions  . Penicillins Hives    Has patient had a PCN reaction causing immediate rash, facial/tongue/throat swelling, SOB or lightheadedness with hypotension: Yes Has patient had a PCN reaction causing severe rash involving mucus membranes or skin necrosis: Yes Has patient had a PCN reaction that required hospitalization No Has patient had a PCN reaction occurring within the last 10 years: No If all of the above answers are "NO", then may proceed with Cephalosporin use.   . Tramadol Nausea Only and Other (See Comments)    shakes    History of Present Illness  Paige Cook is a past medical history of nonischemic cardiomyopathy, PACs, coronary artery disease,  hyperlipidemia, and obstructive sleep apnea on CPAP.  Her cardiac catheterization on 12/2009 showed EF 30-40 with 50% proximal LAD stenosis, and 30% mid LAD stenosis.  Echocardiogram 2013 showed EF 45%.  She also has a history of frequent PACs seen on Holter monitor with no indications of atrial fibrillation.  She was seen 09-Feb-2019 and noted to have mild edema but was otherwise asymptomatic.  08/06/2019 she presented with symptoms of PND.  Her Lasix was increased at that time.  An echocardiogram showed a reduction in her LVEF to 20% with moderate mitral regurgitation.  It was noted at that time she was noncompliant with her CPAP.  However, her symptoms improved with a increase in her Lasix.  She was seen by Dr. Caryl Comes who felt she was not a good candidate for prophylactic ICD due to her age and comorbidities.  Further titration of her IV diuretic was limited by her CKD and hypotension.  Due to her worsening renal function in August, her Lasix was reduced to just 3 times per week.  She again reported to the clinic with increased edema and shortness of breath.  Her Lasix was again increased back to daily dosing and her symptoms resolved.  Her husband passed away due to prolonged illness02/24/2020) and daughter passed away from [facility] 12-17-19.   She is seen by Isaac Laud  Meng, PA-C on 12/16/2019 for dyspnea.  Her Lasix was increased to 40 mg daily and her BMP remained stable.  A follow-up was done on 12/24/2019.  During that visit she denied shortness of breath and was feeling well her Lasix was continued at 40 mg daily.  She presents to the clinic today for dyspnea and states she has had increased work of breathing for the last 6 days.  She has also been eating soup regularly.  She states that typically she will do 10 minutes of exercise 3 times per day.  However, she has not been able to do that last week.  She states that she usually is able to weigh herself.  However over the last few days she has not been able to weigh as  well.  Her dry weight is somewhere around 107-110 range.  Her daughter did not want her to be admitted to the hospital due to COVID-19 so she requested an early appointment today.  She went on to state that she has not been using her CPAP regularly due to the amount of pressure that it produces.  I will have them keep their appointment with Dr. Martinique on Monday.  She will take an extra dose of Lasix today and increase her Lasix to 60 mg for the next 3 days.  She denies chest pain, fatigue, palpitations, melena, hematuria, hemoptysis, diaphoresis, weakness, presyncope, syncope, orthopnea, and PND.   Home Medications    Prior to Admission medications   Medication Sig Start Date End Date Taking? Authorizing Provider  aspirin 81 MG chewable tablet Chew 81 mg by mouth daily with breakfast.    [provider]  carvedilol (COREG) 6.25 MG tablet TAKE 1 TABLET BY MOUTH TWICE A DAY WITH A MEAL. 04/16/19   Martinique, Peter M, MD  Cholecalciferol (VITAMIN D3) 50 MCG (2000 UT) TABS Take 50 mcg by mouth.    [provider]  Cyanocobalamin (B-12 PO) Take by mouth 3 (three) times a week.     [provider]  diclofenac Sodium (VOLTAREN) 1 % GEL Apply 2 g topically 2 (two) times daily. As needed 01/11/20   Trula Slade, DPM  furosemide (LASIX) 40 MG tablet Take 40 mg daily 12/16/19   Almyra Deforest, [place]  Magnesium Citrate 200 MG TABS Take 200 mg by mouth.    [provider]  vitamin C (ASCORBIC ACID) 250 MG tablet Take 500 mg by mouth daily with breakfast.     [provider]    Family History    Family History  Problem Relation Age of Onset  . Heart disease Father   . Heart attack Father   . Heart attack Mother   . Heart failure Sister 80  . Stroke Neg Hx    She indicated that her mother is deceased. She indicated that her father is deceased. She indicated that only one of her two sisters is alive. She indicated that only one of her two brothers is alive. She  indicated that the status of her neg hx is unknown.  Social History    Social History   Socioeconomic History  . Marital status: Married    Spouse name: Not on file  . Number of children: 4  . Years of education: Not on file  . Highest education level: Not on file  Occupational History  . Occupation: retired Paramedic  Tobacco Use  . Smoking status: Never Smoker  . Smokeless tobacco: Never Used  Substance and Sexual Activity  .  Alcohol use: No  . Drug use: No  . Sexual activity: Not Currently  Other Topics Concern  . Not on file  Social History Narrative   Married, 1 son and 3 daughters.    One coffee a day no alcohol no tobacco   05/05/2015   Social Determinants of Health   Financial Resource Strain:   . Difficulty of Paying Living Expenses: Not on file  Food Insecurity:   . Worried About Charity fundraiser in the Last Year: Not on file  . Ran Out of Food in the Last Year: Not on file  Transportation Needs:   . Lack of Transportation (Medical): Not on file  . Lack of Transportation (Non-Medical): Not on file  Physical Activity:   . Days of Exercise per Week: Not on file  . Minutes of Exercise per Session: Not on file  Stress:   . Feeling of Stress : Not on file  Social Connections:   . Frequency of Communication with Friends and Family: Not on file  . Frequency of Social Gatherings with Friends and Family: Not on file  . Attends Religious Services: Not on file  . Active Member of Clubs or Organizations: Not on file  . Attends Archivist Meetings: Not on file  . Marital Status: Not on file  Intimate Partner Violence:   . Fear of Current or Ex-Partner: Not on file  . Emotionally Abused: Not on file  . Physically Abused: Not on file  . Sexually Abused: Not on file     Review of Systems    General:  No chills, fever, night sweats or weight changes.  Cardiovascular:  No chest pain, dyspnea on exertion, edema, orthopnea, palpitations,  paroxysmal nocturnal dyspnea. Dermatological: No rash, lesions/masses Respiratory: No cough, dyspnea Urologic: No hematuria, dysuria Abdominal:   No nausea, vomiting, diarrhea, bright red blood per rectum, melena, or hematemesis Neurologic:  No visual changes, wkns, changes in mental status. All other systems reviewed and are otherwise negative except as noted above.  Physical Exam    VS:  BP 121/69   Pulse 86   Ht 5\' 5"  (1.651 m)   Wt 120 lb 3.2 oz (54.5 kg)   SpO2 99%   BMI 20.00 kg/m  , BMI Body mass index is 20 kg/m. GEN: Well nourished, well developed, in no acute distress. HEENT: normal. Neck: Supple, no JVD, carotid bruits, or masses. Cardiac: RRR, no murmurs, rubs, or gallops. No clubbing, cyanosis, bilateral lower extremity +2 pitting edema.  Radials/DP/PT 2+ and equal bilaterally.  Respiratory:  Respirations regular and unlabored, clear to auscultation bilaterally. GI: Soft, nontender, nondistended, BS + x 4. MS: no deformity or atrophy. Skin: warm and dry, no rash. Neuro:  Strength and sensation are intact. Psych: Normal affect.  Accessory Clinical Findings    ECG personally reviewed by me today-sinus rhythm with marked sinus arrhythmia with occasional PVC right axis deviation pulmonary disease pattern nonspecific ST and T wave abnormality 86 bpm- No acute changes  EKG 12/16/2019 Sinus rhythm with occasional PVCs possible left atrial enlargement 87 bpm Cardiac catheterization 01/10/2010 Left anterior descending 40-50% stenosis in proximal vessel.  30% narrowing in the mid LAD  Mild left ventricular enlargement and severe global hypokinesis, with overall EF 30% and trivial mitral insufficiency  Echocardiogram 07/30/2019 1. The left ventricle has a visually estimated ejection fraction of 20%. The cavity size was severely dilated. Left ventricular diastolic Doppler parameters are consistent with restrictive filling. Elevated left ventricular end-diastolic pressure Left  ventrical global hypokinesis without regional wall motion abnormalities. 2. The right ventricle has normal systolic function. The cavity was normal. There is no increase in right ventricular wall thickness. 3. Left atrial size was moderately dilated. 4. Right atrial size was mildly dilated. 5. Moderate thickening of the mitral valve leaflet. Mild calcification of the mitral valve leaflet. There is mild mitral annular calcification present. Mitral valve regurgitation is moderate by color flow Doppler. 6. The aortic valve is tricuspid. Moderate thickening of the aortic valve. Sclerosis without any evidence of stenosis of the aortic valve. Aortic valve regurgitation is mild by color flow Doppler. 7. The aorta is normal unless otherwise noted.    Assessment & Plan   1.  Chronic systolic heart failure-increased dyspnea over the last 3-4 days.  +1 pitting lower extremity edema Take an extra dose of Lasix tonight and increase Lasix to 60 mg x 3 days then resume normal 40 mg daily Continue carvedilol 6.25 mg twice daily Heart healthy low-sodium diet-salty 6 given Encourage fluid restriction 2 L daily Encourage daily weights Lower extremity support stockings Use CPAP nightly- needs titration for lower pressures, working with Mariann Laster RN on what is needed for titration. BMP on follow up with Dr. Martinique  Nonischemic cardiomyopathy-echocardiogram 07/2019 showed an estimated ejection fraction of 20%. Continue furosemide 40 mg tablet daily after increased 3-day dosing Heart healthy low-sodium diet-salty 6 given with sodium restriction 1500 mg. Encourage daily weights Lower extremity support stockings  Coronary artery disease-no chest pain today.  Cardiac catheterization 01/10/2010 showed above. Continue aspirin 81 mg tablet daily Continue carvedilol 6.25 mg twice daily Heart healthy low-sodium diet Increase physical activity as tolerated  Disposition: Follow-up with Dr. Martinique on  Monday   Jossie Ng. [facility] [place] [address] Office [phone] Fax [phone]

## 2020-01-14 NOTE — Telephone Encounter (Signed)
Called patient, advised of message from yesterday- she states her mother has been having SOB and is concerned and would like for her to be checked before the weekend. See previous messages from yesterday.  I did place patient on NP schedule for this afternoon- she states that her mother continues to have issues with breathing even while siting up. She states nobody manages her CPAP machine- but she is not sleeping good at night, and she has noticed some swelling in her legs/feet, and possibly abdomen area but they are unable to get a weight at home- she is currently on 40 mg of lasix, but does have some kidney issues and they are afraid to increase further without speaking with someone.  They would like to keep appointment with MD on Monday, but wanted to be seen by APP today to get her through the weekend, to try to keep her out of the hospital due to [place]- will route to NP seeing patient this afternoon at 3:00.

## 2020-01-14 NOTE — Telephone Encounter (Signed)
Pt c/o Shortness Of Breath: STAT if SOB developed within the last 24 hours or pt is noticeably SOB on the phone  1. Are you currently SOB (can you hear that pt is SOB on the phone)?   2. How long have you been experiencing SOB? Since Saturday  3. Are you SOB when sitting or when up moving around? All the time  4. Are you currently experiencing any other symptoms? Not sleeping well since Saturday. She is not able to use her CPAP because it is blowing too much air. She was propped up in bed with a wedge but still could not catch her breath.  Daughter thought about calling 911. She is worried about going the whole weekend and the patient having the same problem and was documented by Altha Harm in a phone note yesterday 01-27.   Please call the daughter

## 2020-01-18 ENCOUNTER — Encounter: Payer: Self-pay | Admitting: Cardiology

## 2020-01-18 ENCOUNTER — Other Ambulatory Visit: Payer: Self-pay

## 2020-01-18 ENCOUNTER — Ambulatory Visit (INDEPENDENT_AMBULATORY_CARE_PROVIDER_SITE_OTHER): Payer: Medicare Other | Admitting: Cardiology

## 2020-01-18 VITALS — BP 128/72 | HR 67 | Temp 96.8°F | Ht 63.0 in | Wt 117.6 lb

## 2020-01-18 DIAGNOSIS — I2510 Atherosclerotic heart disease of native coronary artery without angina pectoris: Secondary | ICD-10-CM

## 2020-01-18 DIAGNOSIS — I428 Other cardiomyopathies: Secondary | ICD-10-CM

## 2020-01-18 DIAGNOSIS — I10 Essential (primary) hypertension: Secondary | ICD-10-CM | POA: Diagnosis not present

## 2020-01-18 DIAGNOSIS — I5023 Acute on chronic systolic (congestive) heart failure: Secondary | ICD-10-CM

## 2020-01-18 DIAGNOSIS — N1832 Chronic kidney disease, stage 3b: Secondary | ICD-10-CM | POA: Diagnosis not present

## 2020-01-18 MED ORDER — HYDRALAZINE HCL 10 MG PO TABS: 5.0000 mg | ORAL_TABLET | Freq: Three times a day (TID) | ORAL | 3 refills | Status: DC

## 2020-01-18 MED ORDER — ISOSORBIDE MONONITRATE ER 30 MG PO TB24: 30.0000 mg | ORAL_TABLET | Freq: Every day | ORAL | 3 refills | Status: DC

## 2020-01-18 MED ORDER — FUROSEMIDE 40 MG PO TABS: 40.0000 mg | ORAL_TABLET | Freq: Two times a day (BID) | ORAL | 3 refills | Status: DC

## 2020-01-18 NOTE — Addendum Note (Signed)
Addended by: Kathyrn Lass on: 01/18/2020 11:57 AM   Modules accepted: Orders

## 2020-01-18 NOTE — Patient Instructions (Signed)
Continue sodium restriction  Increase lasix to 40 mg twice a day  Start Imdur (isosorbide) 30 mg daily  Start hydralazine 5 mg three times a day

## 2020-01-19 ENCOUNTER — Telehealth: Payer: Self-pay | Admitting: *Deleted

## 2020-01-19 NOTE — Telephone Encounter (Signed)
-----   Message from Trula Slade, DPM sent at 01/19/2020  9:24 AM EST ----- Can you see how she was doing? She was the patient that had pain to the back of the heel and was red. She has kidney disease and heart disease so we cannot do oral NSAIDS. I gave her biofreeze that she can use but also Voltaren gel was prescribed to use sparingly. I believe we also did an Achilles sleeve. I saw her cardiologist has increased her lasix to help with swelling. Can you see if the heel is doing any better? I have been thinking about her.

## 2020-01-19 NOTE — Telephone Encounter (Signed)
Called and left a message for the patient's daughter Anderson Malta) to call me back at the [place] office at [phone]. Lattie Haw

## 2020-01-25 ENCOUNTER — Other Ambulatory Visit: Payer: Self-pay

## 2020-01-25 ENCOUNTER — Encounter: Payer: Self-pay | Admitting: Podiatry

## 2020-01-25 ENCOUNTER — Ambulatory Visit (INDEPENDENT_AMBULATORY_CARE_PROVIDER_SITE_OTHER): Payer: Medicare Other | Admitting: Podiatry

## 2020-01-25 DIAGNOSIS — M7731 Calcaneal spur, right foot: Secondary | ICD-10-CM | POA: Diagnosis not present

## 2020-01-25 DIAGNOSIS — I2510 Atherosclerotic heart disease of native coronary artery without angina pectoris: Secondary | ICD-10-CM

## 2020-01-25 DIAGNOSIS — M7751 Other enthesopathy of right foot: Secondary | ICD-10-CM | POA: Diagnosis not present

## 2020-01-27 ENCOUNTER — Telehealth: Payer: Self-pay | Admitting: *Deleted

## 2020-01-27 ENCOUNTER — Other Ambulatory Visit: Payer: Self-pay

## 2020-01-27 ENCOUNTER — Telehealth: Payer: Self-pay | Admitting: Cardiology

## 2020-01-27 ENCOUNTER — Encounter: Payer: Self-pay | Admitting: Pulmonary Disease

## 2020-01-27 ENCOUNTER — Ambulatory Visit (INDEPENDENT_AMBULATORY_CARE_PROVIDER_SITE_OTHER): Payer: Medicare Other | Admitting: Pulmonary Disease

## 2020-01-27 VITALS — BP 90/62 | HR 68 | Temp 97.5°F | Ht 65.0 in | Wt 107.8 lb

## 2020-01-27 DIAGNOSIS — I429 Cardiomyopathy, unspecified: Secondary | ICD-10-CM | POA: Diagnosis not present

## 2020-01-27 DIAGNOSIS — G4733 Obstructive sleep apnea (adult) (pediatric): Secondary | ICD-10-CM

## 2020-01-27 NOTE — Telephone Encounter (Signed)
Incoming call from scheduling. Call transferred to triage nurse. DPR on file. Spoke with pt daughter, Anderson Malta. Informed her that there are no nurse visits at this time for BP check nor are there any appts available today. She states BP check is to see if at-home BP monitor is accurate since it is old. Informed her that pt can be set up for appt with HTN clinic to calibrate. She states that during last OV with Dr. Martinique pt was advised to contact office if her SBP dropped below 100 and it was 90 today at doctor's office. She states pt SBP usually 100s-110s. She states pt was started on hydralazine and Imdur last week (Lasix was also started) and pt does not necessarily have specific symptoms for complaint but pt has been feeling 'blah' since last Friday. She questions if this is related to new meds. Informed her that it is possible this is d/t new start of meds. Pt was set up for HTN clinic appt tomorrow by scheduling per triage nurse request, but triage nurse informed pt daughter that nurse will consult with pharmD on if pt should keep HTN clinic appt or f/u with APP, but pt should recheck when home

## 2020-01-27 NOTE — Telephone Encounter (Signed)
Advised pt daughter of the following from pharmD:  "Decrease furosemide to 40mg  daily for now, bring BP readings and home monitor to OV tomorrow."  She states pt takes furosemide in the morning and in the afternoon per Dr. Martinique so pt has already had both doses. Advised her that pt should only take daily for now and keep log of BP readings between now and HTN clinic appt and bring these and BP monitor to OV. Pt daughter verbalized understanding

## 2020-01-27 NOTE — Patient Instructions (Signed)
We will contact [facility] to find out about your current pressure and see whether we can reduce the pressure  You will require a split-night study-sleep study in the lab to ascertain you have sleep disordered breathing and the right treatment pressure needed  I will see you back in about 6 to 8 weeks  Call with any significant concerns  Continue using your CPAP at present as tolerated

## 2020-01-27 NOTE — Telephone Encounter (Signed)
Patient's daughter is calling stating they just left a Doctors appointment and her top number of BP was 90 she would like to know if she can come by the office very quickly to have the patients BP rechecked. Please advise.

## 2020-01-27 NOTE — Telephone Encounter (Signed)
[facility] called and Paige Cook stated that the patient has moved the PT till Friday and I stated in the voice mail that should be ok and to call the office at [phone]. Lattie Haw

## 2020-01-27 NOTE — Telephone Encounter (Signed)
Decrease furosemide to 40mg  daily for now, bring BP readings and home monitor to OV tomorrow.  No additional recommendation at this time. Patient asymptomatic.

## 2020-01-27 NOTE — Progress Notes (Signed)
Subjective:    Patient ID: Paige Cook, female    DOB: 06-24-1928, 86 y.o.   MRN: [id number]  Patient with a history of obstructive sleep apnea diagnosed about 2013  She stopped using CPAP after some years and reinitiated CPAP use about a year ago Recently noticed that the pressure seems too much for her  Some nights able to tolerate the machine well and other nights unable to tolerate it well She was recently diagnosed with cardiomyopathy with worsening ejection fraction of as low as 20% Medication changes have helped symptoms of shortness of breath  Gets fatigued easily  Did not have significant difficulty tolerating CPAP over the last few years She could not give me any specific reason for stopping CPAP a few years back  History significant for heart failure, cardiac rhythm problems chronic kidney disease  Study from 2013 revealed moderate obstructive sleep apnea  Past Medical History:  Diagnosis Date  . Allergic rhinitis   . Anemia   . Arthritis   . Bladder cancer ([place])    giant tumor  . CAD (coronary artery disease)   . Cardiac arrhythmia    benign  . Carotid bruit    left  . CHF (congestive heart failure) (HCC)    CHF with systolic dysfuntion ; dilated nonishemic cardiomyopathy  . Colon cancer ([place]) 1989  . Colon polyps   . Diverticulosis   . Epistaxis   . Female bladder prolapse   . Gallstones   . Heart failure ([place])   . Hypercholesteremia   . Hyponatremia    Probably secondary to her CHF  . OSA (obstructive sleep apnea) 04/29/2012  . SOB (shortness of breath)    Sometimes at night   Social History   Socioeconomic History  . Marital status: Married    Spouse name: Not on file  . Number of children: 4  . Years of education: Not on file  . Highest education level: Not on file  Occupational History  . Occupation: retired Paramedic  Tobacco Use  . Smoking status: Never Smoker  . Smokeless tobacco: Never Used  Substance and Sexual  Activity  . Alcohol use: No  . Drug use: No  . Sexual activity: Not Currently  Other Topics Concern  . Not on file  Social History Narrative   Married, 1 son and 3 daughters.    One coffee a day no alcohol no tobacco   05/05/2015   Social Determinants of Health   Financial Resource Strain:   . Difficulty of Paying Living Expenses: Not on file  Food Insecurity:   . Worried About Charity fundraiser in the Last Year: Not on file  . Ran Out of Food in the Last Year: Not on file  Transportation Needs:   . Lack of Transportation (Medical): Not on file  . Lack of Transportation (Non-Medical): Not on file  Physical Activity:   . Days of Exercise per Week: Not on file  . Minutes of Exercise per Session: Not on file  Stress:   . Feeling of Stress : Not on file  Social Connections:   . Frequency of Communication with Friends and Family: Not on file  . Frequency of Social Gatherings with Friends and Family: Not on file  . Attends Religious Services: Not on file  . Active Member of Clubs or Organizations: Not on file  . Attends Archivist Meetings: Not on file  . Marital Status: Not on file  Intimate Partner Violence:   .  Fear of Current or Ex-Partner: Not on file  . Emotionally Abused: Not on file  . Physically Abused: Not on file  . Sexually Abused: Not on file   Family History  Problem Relation Age of Onset  . Heart disease Father   . Heart attack Father   . Heart attack Mother   . Heart failure Sister 58  . Stroke Neg Hx    Review of Systems  Constitutional: Negative for fever and unexpected weight change.  HENT: Negative for congestion, dental problem, ear pain, nosebleeds, postnasal drip, rhinorrhea, sinus pressure, sneezing, sore throat and trouble swallowing.   Eyes: Negative for redness and itching.  Respiratory: Positive for shortness of breath. Negative for cough, chest tightness and wheezing.   Cardiovascular: Negative for palpitations and leg swelling.   Gastrointestinal: Negative for nausea and vomiting.  Genitourinary: Positive for dysuria.  Musculoskeletal: Negative for joint swelling.  Skin: Negative for rash.  Allergic/Immunologic: Positive for environmental allergies. Negative for food allergies and immunocompromised state.  Neurological: Negative for headaches.  Hematological: Does not bruise/bleed easily.  Psychiatric/Behavioral: Negative for dysphoric mood. The patient is nervous/anxious.       Objective:   Physical Exam Constitutional:      Appearance: Normal appearance.  HENT:     Head: Normocephalic and atraumatic.     Nose: Nose normal. No congestion.     Mouth/Throat:     Mouth: Mucous membranes are moist.  Eyes:     General:        Right eye: No discharge.        Left eye: No discharge.     Extraocular Movements: Extraocular movements intact.     Pupils: Pupils are equal, round, and reactive to light.  Cardiovascular:     Rate and Rhythm: Normal rate and regular rhythm.     Pulses: Normal pulses.     Heart sounds: Normal heart sounds. No murmur. No friction rub.  Pulmonary:     Effort: Pulmonary effort is normal. No respiratory distress.     Breath sounds: Normal breath sounds. No stridor. No wheezing or rhonchi.  Musculoskeletal:        General: Normal range of motion.     Cervical back: Normal range of motion and neck supple.  Skin:    General: Skin is warm.  Neurological:     General: No focal deficit present.     Mental Status: She is alert.    Vitals:   01/27/20 1407  BP: 90/62  Pulse: 68  Temp: (!) 97.5 F (36.4 C)  SpO2: 94%   Results of the Epworth flowsheet 01/27/2020  Sitting and reading 0  Watching TV 0  Sitting, inactive in a public place (e.g. a theatre or a meeting) 0  As a passenger in a car for an hour without a break 0  Lying down to rest in the afternoon when circumstances permit 2  Sitting and talking to someone 0  Sitting quietly after a lunch without alcohol 0  In a car,  while stopped for a few minutes in traffic 0  Total score 2   Previous sleep study reviewed showing moderate obstructive sleep apnea     Assessment & Plan:  Obstructive sleep apnea with some intolerance to CPAP pressures  Recent diagnosis of cardiomyopathy with an ejection fraction of 20%  Concern for central sleep apnea  Pathophysiology of sleep disordered breathing discussed with the patient  Treatment options discussed with the patient  Plan Patient will benefit from  having a split-night study -Primary reason for doing a split-night study is to ascertain that she does not have significant Cheyne-Stokes breathing/central sleep apnea that may need a different modality of treatment  We will call medical supply company to find out her current pressures-information I will received stated she is on auto titrating CPAP of 5-15 at present  She will continue using CPAP as tolerated and will follow up with an overnight polysomnogram  Continue medical management of her congestive heart failure  I will see her back in the office in about 6 weeks  Encouraged to call with any significant concerns

## 2020-01-27 NOTE — Progress Notes (Signed)
Subjective: 86 year old female presents the office today with her daughter for follow-up evaluation of right heel pain.  Her daughter states that she is almost a new person and she is not having any issues.  The Voltaren gel is very helpful and she is asking if she can use this other areas of her body.  Area in the right heel appears to be resolved.  No new issues to both lower extremities. Denies any systemic complaints such as fevers, chills, nausea, vomiting. No acute changes since last appointment, and no other complaints at this time.   Objective: AAO x3, NAD DP/PT pulses palpable bilaterally, CRT less than 3 seconds To the posterior aspect of the right heel is a prominent posterior bone spur which is palpable but there is no pain.  There is no edema, erythema or any signs of infection there is no open lesions.  Symptoms appear to be resolved.  No open lesions or pre-ulcerative lesions.  No pain with calf compression, swelling, warmth, erythema  Assessment: Resolved right heel pain  Plan: -All treatment options discussed with the patient including all alternatives, risks, complications.  -Pulmonary continue offloading to help prevent reoccurrence and I dispensed a new gel offloading Achilles pad.  Given her kidney function I would not do significant Voltaren gel and I would hold off on this for now until symptoms have resolved. -Patient encouraged to call the office with any questions, concerns, change in symptoms.   Trula Slade DPM

## 2020-01-28 ENCOUNTER — Ambulatory Visit (INDEPENDENT_AMBULATORY_CARE_PROVIDER_SITE_OTHER): Payer: Medicare Other | Admitting: Pharmacist Clinician (PhC)/ Clinical Pharmacy Specialist

## 2020-01-28 DIAGNOSIS — I5023 Acute on chronic systolic (congestive) heart failure: Secondary | ICD-10-CM

## 2020-01-28 DIAGNOSIS — I2510 Atherosclerotic heart disease of native coronary artery without angina pectoris: Secondary | ICD-10-CM

## 2020-01-28 DIAGNOSIS — I10 Essential (primary) hypertension: Secondary | ICD-10-CM | POA: Diagnosis not present

## 2020-01-28 DIAGNOSIS — I428 Other cardiomyopathies: Secondary | ICD-10-CM | POA: Diagnosis not present

## 2020-01-28 DIAGNOSIS — N1832 Chronic kidney disease, stage 3b: Secondary | ICD-10-CM | POA: Diagnosis not present

## 2020-01-28 NOTE — Progress Notes (Signed)
01/29/2020 Paige Cook 1928/05/08 [id number]   HPI:  Paige Cook is a 86 y.o. female patient of Dr Martinique, with a [place] below who presents today for heart failure medication titration.  See pertinent medical history below.  She was seen by Dr. Martinique on Feb 1 because of complaints of shortness of breath and lower extremity edema.  Because of her CKD and already low normal blood pressures, he could not use Entresto or ACEI/ARB.  He chose to instead give her very low dose hydralazine and isosorbide.  She was started on 5 mg tid hydralazine and 30 mg isosorbide mononitrate.  Her furosemide was also increased to 40 mg bid.  Daughter called the office yesterday to report that patient BP was 90/62 when seen by pulmonologist yesterday.  She also noted patient was feeling 'blah" and wondered if it had to do with new medications.    Patient here today with her daughter.  They have a lot of questions about her newest medications and if they are the cause of her low BP and "blah" feeling.   Daughter notes that on her home scales patient has lost almost 10 pounds since increasing furosemide dose.  Past Medical History: CAD 2011 cath showed 40-50% proximal LAD lesion, 30% mid-LAD lesion  HFrEF 07/2019 - EF 20%  hyperlipidemia 06/2019:  TC 178, TG 51, HDL 64, LDL 104  CKD  SCr 1.89 - CrCl 15.6  OSA Needs split-night study to determine best modality of treatment     Blood Pressure Goal:  130/80  Current Medications: furosemide 40 mg bid, hydralazine 5 mg tid, isosorbide mono 30 mg qd, carvedilol 6.25 mg bid  Family Hx: both parents had MI's  Social Hx: no tobacco or alcohol  Diet: patient was preparing own food until this past week, daughter now taking over.  Has cut sodium to < 1,000 mg/day  Exercise: trying to get up and about, had problem with right heel spur, still has on walking shoe  Home BP readings:  Brings in 10 readings form past several days, range 92-117/49-57  Intolerances:  penicillin, tramadol  Labs: 1/21:  Na 141, K 5.2, Glu 98, BUN 61, SCr 1.89  Wt Readings from Last 3 Encounters:  01/28/20 106 lb (48.1 kg)  01/27/20 107 lb 12.8 oz (48.9 kg)  01/18/20 117 lb 9.6 oz (53.3 kg)   BP Readings from Last 3 Encounters:  01/28/20 102/62  01/27/20 90/62  01/18/20 128/72   Pulse Readings from Last 3 Encounters:  01/28/20 85  01/27/20 68  01/18/20 67    Current Outpatient Medications  Medication Sig Dispense Refill  . aspirin 81 MG chewable tablet Chew 81 mg by mouth daily with breakfast.    . carvedilol (COREG) 6.25 MG tablet TAKE 1 TABLET BY MOUTH TWICE A DAY WITH A MEAL. 180 tablet 2  . Cholecalciferol (VITAMIN D3) 50 MCG (2000 UT) TABS Take 50 mcg by mouth.    . Cyanocobalamin (B-12 PO) Take by mouth 3 (three) times a week.     . furosemide (LASIX) 40 MG tablet Take 1 tablet (40 mg total) by mouth 2 (two) times daily. Take 40 mg daily 180 tablet 3  . hydrALAZINE (APRESOLINE) 10 MG tablet Take 0.5 tablets (5 mg total) by mouth 3 (three) times daily. 45 tablet 3  . isosorbide mononitrate (IMDUR) 30 MG 24 hr tablet Take 1 tablet (30 mg total) by mouth daily. 90 tablet 3  . vitamin C (ASCORBIC ACID) 250  MG tablet Take 500 mg by mouth daily with breakfast.     . diclofenac Sodium (VOLTAREN) 1 % GEL Apply 2 g topically 2 (two) times daily. As needed (Patient not taking: Reported on 01/28/2020) 100 g 2   No current facility-administered medications for this visit.    Allergies  Allergen Reactions  . Penicillins Hives    Has patient had a PCN reaction causing immediate rash, facial/tongue/throat swelling, SOB or lightheadedness with hypotension: Yes Has patient had a PCN reaction causing severe rash involving mucus membranes or skin necrosis: Yes Has patient had a PCN reaction that required hospitalization No Has patient had a PCN reaction occurring within the last 10 years: No If all of the above answers are "NO", then may proceed with Cephalosporin use.    . Tramadol Nausea Only and Other (See Comments)    shakes    Past Medical History:  Diagnosis Date  . Allergic rhinitis   . Anemia   . Arthritis   . Bladder cancer ([place])    giant tumor  . CAD (coronary artery disease)   . Cardiac arrhythmia    benign  . Carotid bruit    left  . CHF (congestive heart failure) (HCC)    CHF with systolic dysfuntion ; dilated nonishemic cardiomyopathy  . Colon cancer ([place]) 1989  . Colon polyps   . Diverticulosis   . Epistaxis   . Female bladder prolapse   . Gallstones   . Heart failure ([place])   . Hypercholesteremia   . Hyponatremia    Probably secondary to her CHF  . OSA (obstructive sleep apnea) 04/29/2012  . SOB (shortness of breath)    Sometimes at night    Blood pressure 102/62, pulse 85, resp. rate 15, height 5\' 6"  (1.676 m), weight 106 lb (48.1 kg), SpO2 95 %.  Acute on chronic systolic (congestive) heart failure (HCC) Patient with HFrEF (EF 20%) now 10 days after increasing furosemide and adding hydralazine and isosorbide.  Due to poor kidney function and low normal BP readings, she is unable to tolerate ACE/ARB/ARNI, therefore trying a combination of low dose hydralazine with isosorbide.  She has lost 10 pounds by home scale in past 10 days.  Will repeat BMET today to be sure she has not become dehydrated with the increased furosemide.  For now will have her continue with hydralazine and isosorbide, but cut the furosemide back to 40 mg daily, with okay to take extra dose if weight gain > 3 lb/day or 5 lb/wk.  Daughter aware to call office if occurs 2 consecutive days.  She has a follow up with APP in 3 weeks.  All questions answered.     Tommy Medal PharmD CPP [facility] [address] [place], [place] [address] [phone]

## 2020-01-28 NOTE — Patient Instructions (Addendum)
Return for a a follow up appointment with Paige Cook on March 3  Go to the lab today  Check your blood pressure at home once or twice daily and keep record of the readings.  Take your meds as follows:  Cut furosemide back to 40 mg once daily  Continue with all other medications   HOW TO TAKE YOUR BLOOD PRESSURE: . Rest 5 minutes before taking your blood pressure. .  Don't smoke or drink caffeinated beverages for at least 30 minutes before. . Take your blood pressure before (not after) you eat. . Sit comfortably with your back supported and both feet on the floor (don't cross your legs). . Elevate your arm to heart level on a table or a desk. . Use the proper sized cuff. It should fit smoothly and snugly around your bare upper arm. There should be enough room to slip a fingertip under the cuff. The bottom edge of the cuff should be 1 inch above the crease of the elbow. . Ideally, take 3 measurements at one sitting and record the average.   Do the following things EVERY DAY:  1) Weigh yourself EVERY morning after you go to the bathroom but before you eat or drink anything. Write this number down in a weight log/diary. If you gain 3 pounds overnight or 5 pounds in a week, take an extra furosemide tablet.  If the weight gain persists more than 2 days, call the office  2) Take your medicines as prescribed. If you have concerns about your medications, please call us before you stop taking them.   3) Eat low salt foods--Limit salt (sodium) to 2000 mg per day. This will help prevent your body from holding onto fluid. Read food labels as many processed foods have a lot of sodium, especially canned goods and prepackaged meats. If you would like some assistance choosing low sodium foods, we would be happy to set you up with a nutritionist.  4) Stay as active as you can everyday. Staying active will give you more energy and make your muscles stronger. Start with 5 minutes at a time and work your  way up to 30 minutes a day. Break up your activities--do some in the morning and some in the afternoon. Start with 3 days per week and work your way up to 5 days as you can.  If you have chest pain, feel short of breath, dizzy, or lightheaded, STOP. If you don't feel better after a short rest, call 911. If you do feel better, call the office to let us know you have symptoms with exercise.  5) Limit all fluids for the day to less than 2 liters. Fluid includes all drinks, coffee, juice, ice chips, soup, jello, and all other liquids.

## 2020-01-29 ENCOUNTER — Encounter: Payer: Self-pay | Admitting: Pharmacist Clinician (PhC)/ Clinical Pharmacy Specialist

## 2020-01-29 DIAGNOSIS — I480 Paroxysmal atrial fibrillation: Secondary | ICD-10-CM | POA: Diagnosis not present

## 2020-01-29 DIAGNOSIS — I130 Hypertensive heart and chronic kidney disease with heart failure and stage 1 through stage 4 chronic kidney disease, or unspecified chronic kidney disease: Secondary | ICD-10-CM | POA: Diagnosis not present

## 2020-01-29 DIAGNOSIS — E785 Hyperlipidemia, unspecified: Secondary | ICD-10-CM | POA: Diagnosis not present

## 2020-01-29 DIAGNOSIS — M7731 Calcaneal spur, right foot: Secondary | ICD-10-CM | POA: Diagnosis not present

## 2020-01-29 DIAGNOSIS — M722 Plantar fascial fibromatosis: Secondary | ICD-10-CM | POA: Diagnosis not present

## 2020-01-29 DIAGNOSIS — D631 Anemia in chronic kidney disease: Secondary | ICD-10-CM | POA: Diagnosis not present

## 2020-01-29 DIAGNOSIS — G4733 Obstructive sleep apnea (adult) (pediatric): Secondary | ICD-10-CM | POA: Diagnosis not present

## 2020-01-29 DIAGNOSIS — Z7982 Long term (current) use of aspirin: Secondary | ICD-10-CM | POA: Diagnosis not present

## 2020-01-29 DIAGNOSIS — M7661 Achilles tendinitis, right leg: Secondary | ICD-10-CM | POA: Diagnosis not present

## 2020-01-29 DIAGNOSIS — I2510 Atherosclerotic heart disease of native coronary artery without angina pectoris: Secondary | ICD-10-CM | POA: Diagnosis not present

## 2020-01-29 DIAGNOSIS — Z9181 History of falling: Secondary | ICD-10-CM | POA: Diagnosis not present

## 2020-01-29 DIAGNOSIS — N183 Chronic kidney disease, stage 3 unspecified: Secondary | ICD-10-CM | POA: Diagnosis not present

## 2020-01-29 DIAGNOSIS — I5022 Chronic systolic (congestive) heart failure: Secondary | ICD-10-CM | POA: Diagnosis not present

## 2020-01-29 LAB — BASIC METABOLIC PANEL
BUN/Creatinine Ratio: 43 — ABNORMAL HIGH (ref 12–28)
BUN: 78 mg/dL (ref 8–27)
CO2: 25 mmol/L (ref 20–29)
Calcium: 9 mg/dL (ref 8.7–10.3)
Chloride: 93 mmol/L — ABNORMAL LOW (ref 96–106)
Creatinine, Ser: 1.8 mg/dL — ABNORMAL HIGH (ref 0.57–1.00)
GFR calc Af Amer: 28 mL/min/{1.73_m2} — ABNORMAL LOW (ref >59)
GFR calc non Af Amer: 25 mL/min/{1.73_m2} — ABNORMAL LOW (ref >59)
Glucose: 145 mg/dL — ABNORMAL HIGH (ref 65–99)
Potassium: 4.1 mmol/L (ref 3.5–5.2)
Sodium: 136 mmol/L (ref 134–144)

## 2020-01-29 LAB — HEPATIC FUNCTION PANEL
ALT: 25 IU/L (ref 0–32)
AST: 26 IU/L (ref 0–40)
Albumin: 3.7 g/dL (ref 3.6–4.6)
Alkaline Phosphatase: 88 IU/L (ref 39–117)
Bilirubin Total: 0.3 mg/dL (ref 0.0–1.2)
Bilirubin, Direct: 0.11 mg/dL (ref 0.00–0.40)
Total Protein: 6 g/dL (ref 6.0–8.5)

## 2020-01-29 LAB — LIPID PANEL
Chol/HDL Ratio: 2.7 ratio (ref 0.0–4.4)
Cholesterol, Total: 164 mg/dL (ref 100–199)
HDL: 60 mg/dL (ref >39)
LDL Chol Calc (NIH): 91 mg/dL (ref 0–99)
Triglycerides: 64 mg/dL (ref 0–149)
VLDL Cholesterol Cal: 13 mg/dL (ref 5–40)

## 2020-01-29 LAB — MAGNESIUM: Magnesium: 2.4 mg/dL — ABNORMAL HIGH (ref 1.6–2.3)

## 2020-01-29 NOTE — Assessment & Plan Note (Signed)
Patient with HFrEF (EF 20%) now 10 days after increasing furosemide and adding hydralazine and isosorbide.  Due to poor kidney function and low normal BP readings, she is unable to tolerate ACE/ARB/ARNI, therefore trying a combination of low dose hydralazine with isosorbide.  She has lost 10 pounds by home scale in past 10 days.  Will repeat BMET today to be sure she has not become dehydrated with the increased furosemide.  For now will have her continue with hydralazine and isosorbide, but cut the furosemide back to 40 mg daily, with okay to take extra dose if weight gain > 3 lb/day or 5 lb/wk.  Daughter aware to call office if occurs 2 consecutive days.  She has a follow up with APP in 3 weeks.  All questions answered.

## 2020-02-02 ENCOUNTER — Telehealth: Payer: Self-pay

## 2020-02-02 DIAGNOSIS — I2510 Atherosclerotic heart disease of native coronary artery without angina pectoris: Secondary | ICD-10-CM | POA: Diagnosis not present

## 2020-02-02 DIAGNOSIS — M722 Plantar fascial fibromatosis: Secondary | ICD-10-CM | POA: Diagnosis not present

## 2020-02-02 DIAGNOSIS — I5022 Chronic systolic (congestive) heart failure: Secondary | ICD-10-CM | POA: Diagnosis not present

## 2020-02-02 DIAGNOSIS — M7661 Achilles tendinitis, right leg: Secondary | ICD-10-CM | POA: Diagnosis not present

## 2020-02-02 DIAGNOSIS — M7731 Calcaneal spur, right foot: Secondary | ICD-10-CM | POA: Diagnosis not present

## 2020-02-02 DIAGNOSIS — I130 Hypertensive heart and chronic kidney disease with heart failure and stage 1 through stage 4 chronic kidney disease, or unspecified chronic kidney disease: Secondary | ICD-10-CM | POA: Diagnosis not present

## 2020-02-02 NOTE — Telephone Encounter (Signed)
Paige Cook- from [facility] stating/requestingwound care verbal orders for twice a week for 4wks than once every 2wks after that. Please advice

## 2020-02-03 NOTE — Telephone Encounter (Signed)
Paige Cook returned my call and states there was a confusion, the pt currently does not have an open wound, Paige Cook states she needs verbal orders for PT not wound care. Thanks

## 2020-02-03 NOTE — Telephone Encounter (Signed)
She currently does not have a wound. They can apply moisturizer to the area daily. However should be an open lesion to let me know.

## 2020-02-03 NOTE — Telephone Encounter (Signed)
Spoke with Paige Cook from [facility] about Dr. Leigh Aurora PT verbal instructions. Paige Cook stated understanding

## 2020-02-03 NOTE — Telephone Encounter (Signed)
I would just put "eval and treat" for lower extremity strengthening and gait training.

## 2020-02-03 NOTE — Telephone Encounter (Signed)
LVM to [place] from [facility] services to return our phone call to go over wound orders from Dr. Jacqualyn Posey.

## 2020-02-05 DIAGNOSIS — I130 Hypertensive heart and chronic kidney disease with heart failure and stage 1 through stage 4 chronic kidney disease, or unspecified chronic kidney disease: Secondary | ICD-10-CM | POA: Diagnosis not present

## 2020-02-05 DIAGNOSIS — M722 Plantar fascial fibromatosis: Secondary | ICD-10-CM | POA: Diagnosis not present

## 2020-02-05 DIAGNOSIS — I5022 Chronic systolic (congestive) heart failure: Secondary | ICD-10-CM | POA: Diagnosis not present

## 2020-02-05 DIAGNOSIS — M7661 Achilles tendinitis, right leg: Secondary | ICD-10-CM | POA: Diagnosis not present

## 2020-02-05 DIAGNOSIS — M7731 Calcaneal spur, right foot: Secondary | ICD-10-CM | POA: Diagnosis not present

## 2020-02-05 DIAGNOSIS — I2510 Atherosclerotic heart disease of native coronary artery without angina pectoris: Secondary | ICD-10-CM | POA: Diagnosis not present

## 2020-02-09 DIAGNOSIS — M7661 Achilles tendinitis, right leg: Secondary | ICD-10-CM | POA: Diagnosis not present

## 2020-02-09 DIAGNOSIS — I130 Hypertensive heart and chronic kidney disease with heart failure and stage 1 through stage 4 chronic kidney disease, or unspecified chronic kidney disease: Secondary | ICD-10-CM | POA: Diagnosis not present

## 2020-02-09 DIAGNOSIS — M722 Plantar fascial fibromatosis: Secondary | ICD-10-CM | POA: Diagnosis not present

## 2020-02-09 DIAGNOSIS — M7731 Calcaneal spur, right foot: Secondary | ICD-10-CM | POA: Diagnosis not present

## 2020-02-09 DIAGNOSIS — I2510 Atherosclerotic heart disease of native coronary artery without angina pectoris: Secondary | ICD-10-CM | POA: Diagnosis not present

## 2020-02-09 DIAGNOSIS — I5022 Chronic systolic (congestive) heart failure: Secondary | ICD-10-CM | POA: Diagnosis not present

## 2020-02-12 DIAGNOSIS — I130 Hypertensive heart and chronic kidney disease with heart failure and stage 1 through stage 4 chronic kidney disease, or unspecified chronic kidney disease: Secondary | ICD-10-CM | POA: Diagnosis not present

## 2020-02-12 DIAGNOSIS — I2510 Atherosclerotic heart disease of native coronary artery without angina pectoris: Secondary | ICD-10-CM | POA: Diagnosis not present

## 2020-02-12 DIAGNOSIS — M722 Plantar fascial fibromatosis: Secondary | ICD-10-CM | POA: Diagnosis not present

## 2020-02-12 DIAGNOSIS — M7661 Achilles tendinitis, right leg: Secondary | ICD-10-CM | POA: Diagnosis not present

## 2020-02-12 DIAGNOSIS — M7731 Calcaneal spur, right foot: Secondary | ICD-10-CM | POA: Diagnosis not present

## 2020-02-12 DIAGNOSIS — I5022 Chronic systolic (congestive) heart failure: Secondary | ICD-10-CM | POA: Diagnosis not present

## 2020-02-15 NOTE — Progress Notes (Signed)
Cardiology Clinic Note   Patient Name: Paige Cook Date of Encounter: 02/17/2020  Primary Care Provider:  Burnard Bunting, MD Primary Cardiologist:  Peter Martinique, MD  Patient Profile    Paige Cook 86 year old female presents today for follow-up evaluation of her congestive heart failure.   Past Medical History    Past Medical History:  Diagnosis Date  . Allergic rhinitis   . Anemia   . Arthritis   . Bladder cancer ([place])    giant tumor  . CAD (coronary artery disease)   . Cardiac arrhythmia    benign  . Carotid bruit    left  . CHF (congestive heart failure) (H[facility])    CHF with systolic dysfuntion ; dilated nonishemic cardiomyopathy  . Colon cancer ([place]) 1989  . Colon polyps   . Diverticulosis   . Epistaxis   . Female bladder prolapse   . Gallstones   . Heart failure ([place])   . Hypercholesteremia   . Hyponatremia    Probably secondary to her CHF  . OSA (obstructive sleep apnea) 04/29/2012  . SOB (shortness of breath)    Sometimes at night   Past Surgical History:  Procedure Laterality Date  . ABDOMINAL HYSTERECTOMY    . BLADDER SURGERY  2013   cancer removed  . bladder tact    . CARDIAC CATHETERIZATION     non obstruction CAD  . CHOLECYSTECTOMY    . 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 COLONOSCOPY      Allergies  Allergies  Allergen Reactions  . Penicillins Hives    Has patient had a PCN reaction causing immediate rash, facial/tongue/throat swelling, SOB or lightheadedness with hypotension: Yes Has patient had a PCN reaction causing severe rash involving mucus membranes or skin necrosis: Yes Has patient had a PCN reaction that required hospitalization No Has patient had a PCN reaction occurring within the last 10 years: No If all of the above answers are "NO", then may proceed with Cephalosporin use.   . Tramadol Nausea Only and Other (See Comments)    shakes    History of Present Illness    Paige Cook is a past medical history of  nonischemic cardiomyopathy, PACs, coronary artery disease, hyperlipidemia, and obstructive sleep apnea on CPAP.  Her cardiac catheterization on 12/2009 showed EF 30-40 with 50% proximal LAD stenosis, and 30% mid LAD stenosis.  Echocardiogram 2013 showed EF 45%.  She also has a history of frequent PACs seen on Holter monitor with no indications of atrial fibrillation.  She was seen 01/2019 and noted to have mild edema but was otherwise asymptomatic.  08/06/2019 she presented with symptoms of PND.  Her Lasix was increased at that time.  An echocardiogram showed a reduction in her LVEF to 20% with moderate mitral regurgitation.  It was noted at that time she was noncompliant with her CPAP.  However, her symptoms improved with a increase in her Lasix.  She was seen by Dr. Caryl Comes who felt she was not a good candidate for prophylactic ICD due to her age and comorbidities.  Further titration of her IV diuretic was limited by her CKD and hypotension.  Due to her worsening renal function in August, her Lasix was reduced to just 3 times per week.  She again reported to the clinic with increased edema and shortness of breath.  Her Lasix was again increased back to daily dosing and her symptoms resolved.  Her husband passed away due to prolonged illness( 01/2019)  and daughter passed away from [place] 2019/12/26.   She is seen by Almyra Deforest, PA-C on 26-Dec-2019 for dyspnea.  Her Lasix was increased to 40 mg daily and her BMP remained stable.  A follow-up was done on 12/24/2019.  During that visit she denied shortness of breath and was feeling well her Lasix was continued at 40 mg daily.  She presented to the clinic 01/14/2020 for dyspnea and stated she  had increased work of breathing for the last 6 days.  She had also been eating soup regularly.   She states that she usually is able to weigh herself.  However over the last few days she has not been able to weigh as well.  Her dry weight was somewhere around 107-110 range.  Her daughter  did not want her to be admitted to the hospital due to COVID-19 so she requested an early appointment today.  She went on to state that she had not been using her CPAP regularly due to the amount of pressure that it produced.  I will had them keep their appointment with Dr. Martinique .    Instructed her to take an extra dose of Lasix  and increase her Lasix to 60 mg for the next 3 days.  She was seen by Dr. Martinique on 01/18/2020.  During that time she continued to complain of restlessness and increased lower extremity edema as well as abdominal swelling.  She denied cough or chest pain.  She had set up an appointment on 01/27/2020 for further management of her CPAP.  She was also following a low-sodium diet.  Her Lasix was further increased to 40 mg twice daily.  She was started on Imdur 30 mg daily and hydralazine 5 mg 3 times daily.  Creatinine remained stable at 1.80 on 01/28/2020.  Her weight had decreased from 120.2pounds to 117.6 pounds.  She presents to the clinic today for follow-up evaluation and states she feels much better.  She has lost around 14 pounds.  Her breathing is much improved.  She has returned to her daily activities and is now eating a low-sodium diet.  She denies chest pain, lower extremity edema, fatigue, palpitations, melena, hematuria, hemoptysis, diaphoresis, weakness, presyncope, syncope, orthopnea, and PND.  Home Medications    Prior to Admission medications   Medication Sig Start Date End Date Taking? Authorizing Provider  aspirin 81 MG chewable tablet Chew 81 mg by mouth daily with breakfast.    [provider]  carvedilol (COREG) 6.25 MG tablet TAKE 1 TABLET BY MOUTH TWICE A DAY WITH A MEAL. 04/16/19   Martinique, Peter M, MD  Cholecalciferol (VITAMIN D3) 50 MCG (2000 UT) TABS Take 50 mcg by mouth.    [provider]  Cyanocobalamin (B-12 PO) Take by mouth 3 (three) times a week.     [provider]  diclofenac Sodium (VOLTAREN) 1 % GEL Apply 2 g  topically 2 (two) times daily. As needed Patient not taking: Reported on 01/28/2020 01/11/20   Trula Slade, DPM  furosemide (LASIX) 40 MG tablet Take 1 tablet (40 mg total) by mouth 2 (two) times daily. Take 40 mg daily 01/18/20   Martinique, Peter M, MD  hydrALAZINE (APRESOLINE) 10 MG tablet Take 0.5 tablets (5 mg total) by mouth 3 (three) times daily. 01/18/20   Martinique, Peter M, MD  isosorbide mononitrate (IMDUR) 30 MG 24 hr tablet Take 1 tablet (30 mg total) by mouth daily. 01/18/20 01/12/21  Martinique, Peter M, MD  vitamin C (  ASCORBIC ACID) 250 MG tablet Take 500 mg by mouth daily with breakfast.     [provider]    Family History    Family History  Problem Relation Age of Onset  . Heart disease Father   . Heart attack Father   . Heart attack Mother   . Heart failure Sister 56  . Stroke Neg Hx    She indicated that her mother is deceased. She indicated that her father is deceased. She indicated that only one of her two sisters is alive. She indicated that only one of her two brothers is alive. She indicated that the status of her neg hx is unknown.  Social History    Social History   Socioeconomic History  . Marital status: Married    Spouse name: Not on file  . Number of children: 4  . Years of education: Not on file  . Highest education level: Not on file  Occupational History  . Occupation: retired Paramedic  Tobacco Use  . Smoking status: Never Smoker  . Smokeless tobacco: Never Used  Substance and Sexual Activity  . Alcohol use: No  . Drug use: No  . Sexual activity: Not Currently  Other Topics Concern  . Not on file  Social History Narrative   Married, 1 son and 3 daughters.    One coffee a day no alcohol no tobacco   05/05/2015   Social Determinants of Health   Financial Resource Strain:   . Difficulty of Paying Living Expenses: Not on file  Food Insecurity:   . Worried About Charity fundraiser in the Last Year: Not on file  . Ran Out of  Food in the Last Year: Not on file  Transportation Needs:   . Lack of Transportation (Medical): Not on file  . Lack of Transportation (Non-Medical): Not on file  Physical Activity:   . Days of Exercise per Week: Not on file  . Minutes of Exercise per Session: Not on file  Stress:   . Feeling of Stress : Not on file  Social Connections:   . Frequency of Communication with Friends and Family: Not on file  . Frequency of Social Gatherings with Friends and Family: Not on file  . Attends Religious Services: Not on file  . Active Member of Clubs or Organizations: Not on file  . Attends Archivist Meetings: Not on file  . Marital Status: Not on file  Intimate Partner Violence:   . Fear of Current or Ex-Partner: Not on file  . Emotionally Abused: Not on file  . Physically Abused: Not on file  . Sexually Abused: Not on file     Review of Systems    General:  No chills, fever, night sweats or weight changes.  Cardiovascular:  No chest pain, dyspnea on exertion, edema, orthopnea, palpitations, paroxysmal nocturnal dyspnea. Dermatological: No rash, lesions/masses Respiratory: No cough, dyspnea Urologic: No hematuria, dysuria Abdominal:   No nausea, vomiting, diarrhea, bright red blood per rectum, melena, or hematemesis Neurologic:  No visual changes, wkns, changes in mental status. All other systems reviewed and are otherwise negative except as noted above.  Physical Exam    VS:  BP 106/76   Pulse 78   Ht 5\' 6"  (1.676 m)   Wt 106 lb 6.4 oz (48.3 kg)   BMI 17.17 kg/m  , BMI Body mass index is 17.17 kg/m. GEN: Well nourished, well developed, in no acute distress. HEENT: normal. Neck: Supple, no JVD, carotid  bruits, or masses. Cardiac: RRR, no murmurs, rubs, or gallops. No clubbing, cyanosis, edema.  Radials/DP/PT 2+ and equal bilaterally.  Respiratory:  Respirations regular and unlabored, clear to auscultation bilaterally. GI: Soft, nontender, nondistended, BS + x 4. MS:  no deformity or atrophy. Skin: warm and dry, no rash. Neuro:  Strength and sensation are intact. Psych: Normal affect.  Accessory Clinical Findings    ECG personally reviewed by me today-none today  EKG 01/14/2020 Sinus rhythm with marked sinus arrhythmia with occasional PVC right axis deviation pulmonary disease pattern nonspecific ST and T wave abnormality 86 bpm- No acute changes  EKG 12/16/2019 Sinus rhythm with occasional PVCs possible left atrial enlargement 87 bpm  Cardiac catheterization 01/10/2010 Left anterior descending 40-50% stenosis in proximal vessel.  30% narrowing in the mid LAD  Mild left ventricular enlargement and severe global hypokinesis, with overall EF 30% and trivial mitral insufficiency  Echocardiogram 07/30/2019 1. The left ventricle has a visually estimated ejection fraction of 20%. The cavity size was severely dilated. Left ventricular diastolic Doppler parameters are consistent with restrictive filling. Elevated left ventricular end-diastolic pressure Left  ventrical global hypokinesis without regional wall motion abnormalities. 2. The right ventricle has normal systolic function. The cavity was normal. There is no increase in right ventricular wall thickness. 3. Left atrial size was moderately dilated. 4. Right atrial size was mildly dilated. 5. Moderate thickening of the mitral valve leaflet. Mild calcification of the mitral valve leaflet. There is mild mitral annular calcification present. Mitral valve regurgitation is moderate by color flow Doppler. 6. The aortic valve is tricuspid. Moderate thickening of the aortic valve. Sclerosis without any evidence of stenosis of the aortic valve. Aortic valve regurgitation is mild by color flow Doppler. 7. The aorta is normal unless otherwise noted.  Assessment & Plan   1.  Chronic systolic heart failure-no increased work of breathing today.  Euvolemic. Continue Lasix 40 mg  daily Continue Imdur 30 mg  daily Continue hydralazine 5 mg 3 times daily Continue carvedilol 6.25 mg twice daily Heart healthy low-sodium diet-salty 6 given Continue fluid restriction 2 L daily Continue daily weights Lower extremity support stockings BMP   Nonischemic cardiomyopathy-echocardiogram 07/2019 showed an estimated ejection fraction of 20%. Continue Lasix 40 mg twice daily Continue heart healthy low-sodium diet-salty 6 given with sodium restriction 1500 mg. Daily weights Lower extremity support stockings  Coronary artery disease-no chest pain today.  Cardiac catheterization 01/10/2010 showed above. Continue aspirin 81 mg tablet daily Continue carvedilol 6.25 mg twice daily Heart healthy low-sodium diet Increase physical activity as tolerated  Hyperlipidemia-01/28/2020: Cholesterol, Total 164; HDL 60; LDL Chol Calc (NIH) 91; Triglycerides 64  Increase physical activity as tolerated Heart healthy low-sodium high-fiber diet   Disposition: Follow-up with Dr. Martinique in 3 months.  Jossie Ng. [facility] [address] Office [phone] Fax [phone]

## 2020-02-16 ENCOUNTER — Telehealth: Payer: Self-pay | Admitting: General Practice

## 2020-02-16 DIAGNOSIS — I2510 Atherosclerotic heart disease of native coronary artery without angina pectoris: Secondary | ICD-10-CM | POA: Diagnosis not present

## 2020-02-16 DIAGNOSIS — I5022 Chronic systolic (congestive) heart failure: Secondary | ICD-10-CM | POA: Diagnosis not present

## 2020-02-16 DIAGNOSIS — I130 Hypertensive heart and chronic kidney disease with heart failure and stage 1 through stage 4 chronic kidney disease, or unspecified chronic kidney disease: Secondary | ICD-10-CM | POA: Diagnosis not present

## 2020-02-16 DIAGNOSIS — M7731 Calcaneal spur, right foot: Secondary | ICD-10-CM | POA: Diagnosis not present

## 2020-02-16 DIAGNOSIS — M7661 Achilles tendinitis, right leg: Secondary | ICD-10-CM | POA: Diagnosis not present

## 2020-02-16 DIAGNOSIS — M722 Plantar fascial fibromatosis: Secondary | ICD-10-CM | POA: Diagnosis not present

## 2020-02-16 NOTE — Telephone Encounter (Signed)
Patient's daughter calling to request she come with the patient to her appointment 3/3, because she says the patient has trouble remembering and needs help getting around.

## 2020-02-16 NOTE — Telephone Encounter (Signed)
Left message-ok to come to appt

## 2020-02-17 ENCOUNTER — Encounter: Payer: Self-pay | Admitting: General Practice

## 2020-02-17 ENCOUNTER — Other Ambulatory Visit: Payer: Self-pay

## 2020-02-17 ENCOUNTER — Ambulatory Visit (INDEPENDENT_AMBULATORY_CARE_PROVIDER_SITE_OTHER): Payer: Medicare Other | Admitting: General Practice

## 2020-02-17 VITALS — BP 106/76 | HR 78 | Ht 66.0 in | Wt 106.4 lb

## 2020-02-17 DIAGNOSIS — I2510 Atherosclerotic heart disease of native coronary artery without angina pectoris: Secondary | ICD-10-CM | POA: Diagnosis not present

## 2020-02-17 DIAGNOSIS — I10 Essential (primary) hypertension: Secondary | ICD-10-CM

## 2020-02-17 DIAGNOSIS — E785 Hyperlipidemia, unspecified: Secondary | ICD-10-CM

## 2020-02-17 DIAGNOSIS — I5023 Acute on chronic systolic (congestive) heart failure: Secondary | ICD-10-CM

## 2020-02-17 DIAGNOSIS — Z79899 Other long term (current) drug therapy: Secondary | ICD-10-CM | POA: Diagnosis not present

## 2020-02-17 NOTE — Patient Instructions (Signed)
Labwork: BMET TODAY HERE IN OUR OFFICE AT [facility]    If you have labs (blood work) drawn today and your tests are completely normal, you will receive your results only by: Marland Kitchen MyChart Message (if you have MyChart) OR A paper copy in the mail If you have any lab test that is abnormal or we need to change your treatment, we will call you to review these results.  Special Instructions: PLEASE READ AND FOLLOW SALTY 6 ATTACHED  Follow-Up: 3 months  In Person Peter Martinique, MD.    At [facility], you and your health needs are our priority.  As part of our continuing mission to provide you with exceptional heart care, we have created designated Provider Care Teams.  These Care Teams include your primary Cardiologist (physician) and Advanced Practice Providers (APPs -  Physician Assistants and Nurse Practitioners) who all work together to provide you with the care you need, when you need it.  Reduce your risk of getting COVID-19 With your heart disease it is especially important for people at increased risk of severe illness from COVID-19, and those who live with them, to protect themselves from getting COVID-19. The best way to protect yourself and to help reduce the spread of the virus that causes COVID-19 is to: Marland Kitchen Limit your interactions with other people as much as possible. . Take COVID-19 when you do interact with others. If you start feeling sick and think you may have COVID-19, get in touch with your healthcare provider within 24 hours.  Thank you for choosing [facility] at [facility]!!

## 2020-02-18 DIAGNOSIS — M7731 Calcaneal spur, right foot: Secondary | ICD-10-CM | POA: Diagnosis not present

## 2020-02-18 DIAGNOSIS — I130 Hypertensive heart and chronic kidney disease with heart failure and stage 1 through stage 4 chronic kidney disease, or unspecified chronic kidney disease: Secondary | ICD-10-CM | POA: Diagnosis not present

## 2020-02-18 DIAGNOSIS — I2510 Atherosclerotic heart disease of native coronary artery without angina pectoris: Secondary | ICD-10-CM | POA: Diagnosis not present

## 2020-02-18 DIAGNOSIS — M722 Plantar fascial fibromatosis: Secondary | ICD-10-CM | POA: Diagnosis not present

## 2020-02-18 DIAGNOSIS — I5022 Chronic systolic (congestive) heart failure: Secondary | ICD-10-CM | POA: Diagnosis not present

## 2020-02-18 DIAGNOSIS — M7661 Achilles tendinitis, right leg: Secondary | ICD-10-CM | POA: Diagnosis not present

## 2020-02-18 LAB — BASIC METABOLIC PANEL
BUN/Creatinine Ratio: 48 — ABNORMAL HIGH (ref 12–28)
BUN: 78 mg/dL (ref 8–27)
CO2: 17 mmol/L — ABNORMAL LOW (ref 20–29)
Calcium: 9.4 mg/dL (ref 8.7–10.3)
Chloride: 99 mmol/L (ref 96–106)
Creatinine, Ser: 1.64 mg/dL — ABNORMAL HIGH (ref 0.57–1.00)
GFR calc Af Amer: 32 mL/min/{1.73_m2} — ABNORMAL LOW (ref >59)
GFR calc non Af Amer: 28 mL/min/{1.73_m2} — ABNORMAL LOW (ref >59)
Glucose: 96 mg/dL (ref 65–99)
Potassium: 4.6 mmol/L (ref 3.5–5.2)
Sodium: 139 mmol/L (ref 134–144)

## 2020-02-23 DIAGNOSIS — I130 Hypertensive heart and chronic kidney disease with heart failure and stage 1 through stage 4 chronic kidney disease, or unspecified chronic kidney disease: Secondary | ICD-10-CM | POA: Diagnosis not present

## 2020-02-23 DIAGNOSIS — M7731 Calcaneal spur, right foot: Secondary | ICD-10-CM | POA: Diagnosis not present

## 2020-02-23 DIAGNOSIS — I5022 Chronic systolic (congestive) heart failure: Secondary | ICD-10-CM | POA: Diagnosis not present

## 2020-02-23 DIAGNOSIS — M7661 Achilles tendinitis, right leg: Secondary | ICD-10-CM | POA: Diagnosis not present

## 2020-02-23 DIAGNOSIS — I2510 Atherosclerotic heart disease of native coronary artery without angina pectoris: Secondary | ICD-10-CM | POA: Diagnosis not present

## 2020-02-23 DIAGNOSIS — M722 Plantar fascial fibromatosis: Secondary | ICD-10-CM | POA: Diagnosis not present

## 2020-02-25 DIAGNOSIS — I5022 Chronic systolic (congestive) heart failure: Secondary | ICD-10-CM | POA: Diagnosis not present

## 2020-02-25 DIAGNOSIS — M7661 Achilles tendinitis, right leg: Secondary | ICD-10-CM | POA: Diagnosis not present

## 2020-02-25 DIAGNOSIS — M7731 Calcaneal spur, right foot: Secondary | ICD-10-CM | POA: Diagnosis not present

## 2020-02-25 DIAGNOSIS — I130 Hypertensive heart and chronic kidney disease with heart failure and stage 1 through stage 4 chronic kidney disease, or unspecified chronic kidney disease: Secondary | ICD-10-CM | POA: Diagnosis not present

## 2020-02-25 DIAGNOSIS — M722 Plantar fascial fibromatosis: Secondary | ICD-10-CM | POA: Diagnosis not present

## 2020-02-25 DIAGNOSIS — I2510 Atherosclerotic heart disease of native coronary artery without angina pectoris: Secondary | ICD-10-CM | POA: Diagnosis not present

## 2020-02-28 DIAGNOSIS — M7661 Achilles tendinitis, right leg: Secondary | ICD-10-CM | POA: Diagnosis not present

## 2020-02-28 DIAGNOSIS — M7731 Calcaneal spur, right foot: Secondary | ICD-10-CM | POA: Diagnosis not present

## 2020-02-28 DIAGNOSIS — I5022 Chronic systolic (congestive) heart failure: Secondary | ICD-10-CM | POA: Diagnosis not present

## 2020-02-28 DIAGNOSIS — Z7982 Long term (current) use of aspirin: Secondary | ICD-10-CM | POA: Diagnosis not present

## 2020-02-28 DIAGNOSIS — N183 Chronic kidney disease, stage 3 unspecified: Secondary | ICD-10-CM | POA: Diagnosis not present

## 2020-02-28 DIAGNOSIS — M722 Plantar fascial fibromatosis: Secondary | ICD-10-CM | POA: Diagnosis not present

## 2020-02-28 DIAGNOSIS — G4733 Obstructive sleep apnea (adult) (pediatric): Secondary | ICD-10-CM | POA: Diagnosis not present

## 2020-02-28 DIAGNOSIS — D631 Anemia in chronic kidney disease: Secondary | ICD-10-CM | POA: Diagnosis not present

## 2020-02-28 DIAGNOSIS — I130 Hypertensive heart and chronic kidney disease with heart failure and stage 1 through stage 4 chronic kidney disease, or unspecified chronic kidney disease: Secondary | ICD-10-CM | POA: Diagnosis not present

## 2020-02-28 DIAGNOSIS — E785 Hyperlipidemia, unspecified: Secondary | ICD-10-CM | POA: Diagnosis not present

## 2020-02-28 DIAGNOSIS — Z9181 History of falling: Secondary | ICD-10-CM | POA: Diagnosis not present

## 2020-02-28 DIAGNOSIS — I2510 Atherosclerotic heart disease of native coronary artery without angina pectoris: Secondary | ICD-10-CM | POA: Diagnosis not present

## 2020-02-28 DIAGNOSIS — I480 Paroxysmal atrial fibrillation: Secondary | ICD-10-CM | POA: Diagnosis not present

## 2020-02-29 DIAGNOSIS — I5022 Chronic systolic (congestive) heart failure: Secondary | ICD-10-CM | POA: Diagnosis not present

## 2020-02-29 DIAGNOSIS — I130 Hypertensive heart and chronic kidney disease with heart failure and stage 1 through stage 4 chronic kidney disease, or unspecified chronic kidney disease: Secondary | ICD-10-CM | POA: Diagnosis not present

## 2020-02-29 DIAGNOSIS — M722 Plantar fascial fibromatosis: Secondary | ICD-10-CM | POA: Diagnosis not present

## 2020-02-29 DIAGNOSIS — M7731 Calcaneal spur, right foot: Secondary | ICD-10-CM | POA: Diagnosis not present

## 2020-02-29 DIAGNOSIS — M7661 Achilles tendinitis, right leg: Secondary | ICD-10-CM | POA: Diagnosis not present

## 2020-02-29 DIAGNOSIS — I2510 Atherosclerotic heart disease of native coronary artery without angina pectoris: Secondary | ICD-10-CM | POA: Diagnosis not present

## 2020-03-08 DIAGNOSIS — I130 Hypertensive heart and chronic kidney disease with heart failure and stage 1 through stage 4 chronic kidney disease, or unspecified chronic kidney disease: Secondary | ICD-10-CM | POA: Diagnosis not present

## 2020-03-08 DIAGNOSIS — I5022 Chronic systolic (congestive) heart failure: Secondary | ICD-10-CM | POA: Diagnosis not present

## 2020-03-08 DIAGNOSIS — M7731 Calcaneal spur, right foot: Secondary | ICD-10-CM | POA: Diagnosis not present

## 2020-03-08 DIAGNOSIS — M7661 Achilles tendinitis, right leg: Secondary | ICD-10-CM | POA: Diagnosis not present

## 2020-03-08 DIAGNOSIS — M722 Plantar fascial fibromatosis: Secondary | ICD-10-CM | POA: Diagnosis not present

## 2020-03-08 DIAGNOSIS — I2510 Atherosclerotic heart disease of native coronary artery without angina pectoris: Secondary | ICD-10-CM | POA: Diagnosis not present

## 2020-03-09 ENCOUNTER — Ambulatory Visit: Payer: Medicare Other | Admitting: Pulmonary Disease

## 2020-03-13 ENCOUNTER — Other Ambulatory Visit: Payer: Self-pay | Admitting: Cardiology

## 2020-04-12 ENCOUNTER — Other Ambulatory Visit: Payer: Self-pay | Admitting: Cardiology

## 2020-05-11 DIAGNOSIS — N3000 Acute cystitis without hematuria: Secondary | ICD-10-CM | POA: Diagnosis not present

## 2020-05-11 DIAGNOSIS — R8279 Other abnormal findings on microbiological examination of urine: Secondary | ICD-10-CM | POA: Diagnosis not present

## 2020-05-24 ENCOUNTER — Telehealth: Payer: Self-pay | Admitting: Cardiology

## 2020-05-24 NOTE — Telephone Encounter (Signed)
Patient's daughter, Anderson Malta, is requesting to accompany the patient during appointment scheduled for 05/27/20 with Dr. Martinique. She states patient requires assistance due to issues with memory. Please advise.

## 2020-05-24 NOTE — Telephone Encounter (Signed)
LM for daughter that it is OK to come to 05/27/20 visit

## 2020-05-25 DIAGNOSIS — I5022 Chronic systolic (congestive) heart failure: Secondary | ICD-10-CM | POA: Diagnosis not present

## 2020-05-25 DIAGNOSIS — E875 Hyperkalemia: Secondary | ICD-10-CM | POA: Diagnosis not present

## 2020-05-25 DIAGNOSIS — Z79899 Other long term (current) drug therapy: Secondary | ICD-10-CM | POA: Diagnosis not present

## 2020-05-26 LAB — BASIC METABOLIC PANEL
BUN/Creatinine Ratio: 49 — ABNORMAL HIGH (ref 12–28)
BUN: 96 mg/dL (ref 10–36)
CO2: 20 mmol/L (ref 20–29)
Calcium: 8.7 mg/dL (ref 8.7–10.3)
Chloride: 100 mmol/L (ref 96–106)
Creatinine, Ser: 1.96 mg/dL — ABNORMAL HIGH (ref 0.57–1.00)
GFR calc Af Amer: 25 mL/min/{1.73_m2} — ABNORMAL LOW (ref >59)
GFR calc non Af Amer: 22 mL/min/{1.73_m2} — ABNORMAL LOW (ref >59)
Glucose: 158 mg/dL — ABNORMAL HIGH (ref 65–99)
Potassium: 4.5 mmol/L (ref 3.5–5.2)
Sodium: 139 mmol/L (ref 134–144)

## 2020-05-26 NOTE — Telephone Encounter (Signed)
Not sure who ordered this lab as I have not seen the patient in 5 month. It appears appear she is getting slightly dry. Mr. Jillson will be seeing Dr. Martinique tomorrow, will need physical exam and potential medication adjustment after the visit.

## 2020-05-26 NOTE — Progress Notes (Signed)
Cardiology Office Note:    Date:  05/27/2020   ID:  Paige Cook, DOB 1928-05-05, MRN [id number]  PCP:  Burnard Bunting, MD  Cardiologist:  Esha Fincher [place], MD  Electrophysiologist:  None   Referring MD: Burnard Bunting, MD   Chief Complaint  Patient presents with  . Congestive Heart Failure    History of Present Illness:    Paige Cook is a 86 y.o. female with a hx of nonischemic cardiomyopathy, PACs, CAD, hyperlipidemia, and OSA on CPAP.  She is seen for follow up CHF. She is seen with her daughter. Previous cardiac catheterization on 01/10/2010 showed EF 30%, 40 to 50% proximal LAD lesion, 30% mid LAD lesion, otherwise normal left circumflex, and left main and RCA.  Patient had EF of 40% in 2012.  Subsequent echocardiogram in 2013 showed EF of 45%.  He has history of very frequent PACs on the Holter monitor however no atrial fibrillation was identified.    Patient was seen in February 2020 and noted to have mild edema however otherwise asymptomatic.  In August he presented with symptom of PND.  Her Lasix was increased.  Echocardiogram obtained showed decreased LVEF to 20% with moderate MR.  It was noted she was noncompliant with CPAP therapy.  Symptoms did not improve with increased Lasix.  She was seen by Dr. Caryl Comes who felt she was not a candidate for prophylactic ICD due to age and comorbidity.  Titration of medication was limited by CKD and her low BP.  Due to worsening renal function in August, Lasix was reduced to 3 times a week.  She later reported increased edema and shortness of breath.  Lasix was eventually increased back to daily dosing and symptoms improved. Was seen by Almyra Deforest PA-C on 12/16/2019.  She was felt to be volume overloaded and gained about 8 pounds within 1 week.  He increased  her Lasix to 40 mg daily. She initially improved but then had more fluid retention. Her lasix was increased to 60 mg for three days. When seen back  she complained of breathlessness and  increased edema in legs and some abdominal swelling. No cough. No chest pain. States her CPAP isn't working well "blowing too much air". Is scheduled for follow up with her sleep physician on Feb 10. She has been more diligent with sodium restriction over the past week with daughter's help. She was started on low dose nitrates and hydralazine. Lasix was increased to 40 mg bid but later reduced to 40 mg daily due to hypotension. When seen in follow up she was doing much better and had lost 14 lbs.   On follow up today she is doing well. Just turned 89. States breathing is doing good and she has no swelling. Recently treated for UTI with doxycycline.     Past Medical History:  Diagnosis Date  . Allergic rhinitis   . Anemia   . Arthritis   . Bladder cancer ([place])    giant tumor  . CAD (coronary artery disease)   . Cardiac arrhythmia    benign  . Carotid bruit    left  . CHF (congestive heart failure) (HCC)    CHF with systolic dysfuntion ; dilated nonishemic cardiomyopathy  . Colon cancer ([place]) 1989  . Colon polyps   . Diverticulosis   . Epistaxis   . Female bladder prolapse   . Gallstones   . Heart failure ([place])   . Hypercholesteremia   . Hyponatremia    Probably secondary  to her CHF  . OSA (obstructive sleep apnea) 04/29/2012  . SOB (shortness of breath)    Sometimes at night    Past Surgical History:  Procedure Laterality Date  . ABDOMINAL HYSTERECTOMY    . BLADDER SURGERY  2013   cancer removed  . bladder tact    . CARDIAC CATHETERIZATION     non obstruction CAD  . CHOLECYSTECTOMY    . 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 COLONOSCOPY      Current Medications: Current Meds  Medication Sig  . aspirin 81 MG chewable tablet Chew 81 mg by mouth daily with breakfast.  . carvedilol (COREG) 6.25 MG tablet TAKE 1 TABLET BY MOUTH TWICE A DAY WITH MEALS  . Cholecalciferol (VITAMIN D3) 50 MCG (2000 UT) TABS Take 50 mcg by mouth.  . Cyanocobalamin (B-12 PO) Take by mouth 3  (three) times a week.   . diclofenac Sodium (VOLTAREN) 1 % GEL Apply 2 g topically 2 (two) times daily. As needed  . hydrALAZINE (APRESOLINE) 10 MG tablet Take 1 tablet (10 mg total) by mouth 3 (three) times daily.  . isosorbide mononitrate (IMDUR) 30 MG 24 hr tablet Take 1 tablet (30 mg total) by mouth daily.  . vitamin C (ASCORBIC ACID) 250 MG tablet Take 500 mg by mouth daily with breakfast.   . [DISCONTINUED] furosemide (LASIX) 40 MG tablet Take 1 tablet (40 mg total) by mouth 2 (two) times daily. Take 40 mg daily     Allergies:   Penicillins and Tramadol   Social History   Socioeconomic History  . Marital status: Married    Spouse name: Not on file  . Number of children: 4  . Years of education: Not on file  . Highest education level: Not on file  Occupational History  . Occupation: retired Paramedic  Tobacco Use  . Smoking status: Never Smoker  . Smokeless tobacco: Never Used  Substance and Sexual Activity  . Alcohol use: No  . Drug use: No  . Sexual activity: Not Currently  Other Topics Concern  . Not on file  Social History Narrative   Married, 1 son and 3 daughters.    One coffee a day no alcohol no tobacco   05/05/2015   Social Determinants of Health   Financial Resource Strain:   . Difficulty of Paying Living Expenses:   Food Insecurity:   . Worried About Charity fundraiser in the Last Year:   . Arboriculturist in the Last Year:   Transportation Needs:   . Film/video editor (Medical):   Marland Kitchen Lack of Transportation (Non-Medical):   Physical Activity:   . Days of Exercise per Week:   . Minutes of Exercise per Session:   Stress:   . Feeling of Stress :   Social Connections:   . Frequency of Communication with Friends and Family:   . Frequency of Social Gatherings with Friends and Family:   . Attends Religious Services:   . Active Member of Clubs or Organizations:   . Attends Archivist Meetings:   Marland Kitchen Marital Status:      Family  History: The patient's family history includes Heart attack in her father and mother; Heart disease in her father; Heart failure (age of onset: 63) in her sister. There is no history of Stroke.  ROS:   Please see the history of present illness.     All other systems reviewed and are negative.  EKGs/Labs/Other  Studies Reviewed:    The following studies were reviewed today:  Cath 01/10/2010    Echo 07/30/2019 IMPRESSIONS   1. The left ventricle has a visually estimated ejection fraction of 20%. The cavity size was severely dilated. Left ventricular diastolic Doppler parameters are consistent with restrictive filling. Elevated left ventricular end-diastolic pressure Left  ventrical global hypokinesis without regional wall motion abnormalities. 2. The right ventricle has normal systolic function. The cavity was normal. There is no increase in right ventricular wall thickness. 3. Left atrial size was moderately dilated. 4. Right atrial size was mildly dilated. 5. Moderate thickening of the mitral valve leaflet. Mild calcification of the mitral valve leaflet. There is mild mitral annular calcification present. Mitral valve regurgitation is moderate by color flow Doppler. 6. The aortic valve is tricuspid. Moderate thickening of the aortic valve. Sclerosis without any evidence of stenosis of the aortic valve. Aortic valve regurgitation is mild by color flow Doppler. 7. The aorta is normal unless otherwise noted.  EKG:  EKG is not ordered today.   Recent Labs: 10/20/2019: BNP 2,519.0 01/28/2020: ALT 25; Magnesium 2.4 05/25/2020: BUN 96; Creatinine, Ser 1.96; Potassium 4.5; Sodium 139  Recent Lipid Panel    Component Value Date/Time   CHOL 164 01/28/2020 0940   TRIG 64 01/28/2020 0940   HDL 60 01/28/2020 0940   CHOLHDL 2.7 01/28/2020 0940   CHOLHDL 2.2 11/03/2015 1042   VLDL 15 11/03/2015 1042   LDLCALC 91 01/28/2020 0940   LDLDIRECT 152.9 03/06/2013 0924   Dated 06/22/19:  cholesterol 178, triglycerides 51, HDL 64, LDL 104. LFTs normal. Hgb 11. TSH normal Dated 12/24/19: creatinine 1.89. potassium 5.2.   Physical Exam:    VS:  BP (!) 120/52   Pulse 81   Ht 5\' 6"  (1.676 m)   Wt 103 lb 12.8 oz (47.1 kg)   SpO2 98%   BMI 16.75 kg/m     Wt Readings from Last 3 Encounters:  05/27/20 103 lb 12.8 oz (47.1 kg)  02/17/20 106 lb 6.4 oz (48.3 kg)  01/28/20 106 lb (48.1 kg)     GEN:  Elderly WF  in no acute distress HEENT: Normal NECK: no JVD; No carotid bruits LYMPHATICS: No lymphadenopathy CARDIAC: RRR, no murmurs, rubs, gallops RESPIRATORY:  Clear to auscultation without rales, wheezing or rhonchi  ABDOMEN: Soft, non-tender, non-distended MUSCULOSKELETAL:  no edema; marked scoliosis SKIN: Warm and dry NEUROLOGIC:  Alert and oriented x 3 PSYCHIATRIC:  Normal affect   ASSESSMENT:    1. NICM (nonischemic cardiomyopathy) ([place])   2. Chronic systolic HF (heart failure) ([place])   3. Essential hypertension   4. Stage 3b chronic kidney disease    PLAN:    In order of problems listed above:  1. Chronic systolic heart failure: weight is down and she has no edema. BMET indicates she is pre renal. Will reduce lasix to 20 mg daily. Monitor weight and swelling.   On Imdur 30 mg daily and hydralazine 5 mg tid. Not a candidate for ACEi/ARV,ARNI, or spironolactone due to CKD.  Sodium restriction. Follow up 4 months with BMET  2. Nonischemic cardiomyopathy: Previous echocardiogram in March 2020 showed worsening EF of 20%.  Poor long-term prognosis.  3. CAD: No  chest pain  4. Hyperlipidemia: LDL 91.   5.   CKD stage 3b. Monitor with medication changes.    Medication Adjustments/Labs and Tests Ordered: Current medicines are reviewed at length with the patient today.  Concerns regarding medicines are outlined above.  No orders  of the defined types were placed in this encounter.  Meds ordered this encounter  Medications  . furosemide (LASIX) 20 MG tablet     Sig: Take 1 tablet (20 mg total) by mouth daily.    Dispense:  90 tablet    Refill:  3    Patient Instructions  Reduce lasix to 20 mg daily. If your weight increases or increased swelling can take an extra 20 mg  Continue your other therapy      Signed, Oryan Winterton [place], MD  05/27/2020 2:56 PM    [place]

## 2020-05-27 ENCOUNTER — Encounter: Payer: Self-pay | Admitting: Cardiology

## 2020-05-27 ENCOUNTER — Ambulatory Visit (INDEPENDENT_AMBULATORY_CARE_PROVIDER_SITE_OTHER): Payer: Medicare Other | Admitting: Cardiology

## 2020-05-27 ENCOUNTER — Other Ambulatory Visit: Payer: Self-pay

## 2020-05-27 VITALS — BP 120/52 | HR 81 | Ht 66.0 in | Wt 103.8 lb

## 2020-05-27 DIAGNOSIS — I428 Other cardiomyopathies: Secondary | ICD-10-CM | POA: Diagnosis not present

## 2020-05-27 DIAGNOSIS — I2510 Atherosclerotic heart disease of native coronary artery without angina pectoris: Secondary | ICD-10-CM | POA: Diagnosis not present

## 2020-05-27 DIAGNOSIS — I5022 Chronic systolic (congestive) heart failure: Secondary | ICD-10-CM | POA: Diagnosis not present

## 2020-05-27 DIAGNOSIS — I10 Essential (primary) hypertension: Secondary | ICD-10-CM | POA: Diagnosis not present

## 2020-05-27 DIAGNOSIS — N1832 Chronic kidney disease, stage 3b: Secondary | ICD-10-CM

## 2020-05-27 MED ORDER — FUROSEMIDE 20 MG PO TABS: 20.0000 mg | ORAL_TABLET | Freq: Every day | ORAL | 3 refills | Status: DC

## 2020-05-27 NOTE — Patient Instructions (Signed)
Reduce lasix to 20 mg daily. If your weight increases or increased swelling can take an extra 20 mg  Continue your other therapy

## 2020-06-16 DIAGNOSIS — R8279 Other abnormal findings on microbiological examination of urine: Secondary | ICD-10-CM | POA: Diagnosis not present

## 2020-06-27 DIAGNOSIS — M859 Disorder of bone density and structure, unspecified: Secondary | ICD-10-CM | POA: Diagnosis not present

## 2020-06-27 DIAGNOSIS — E7849 Other hyperlipidemia: Secondary | ICD-10-CM | POA: Diagnosis not present

## 2020-06-30 DIAGNOSIS — N3000 Acute cystitis without hematuria: Secondary | ICD-10-CM | POA: Diagnosis not present

## 2020-07-04 ENCOUNTER — Telehealth: Payer: Self-pay | Admitting: Cardiology

## 2020-07-04 DIAGNOSIS — M5136 Other intervertebral disc degeneration, lumbar region: Secondary | ICD-10-CM | POA: Diagnosis not present

## 2020-07-04 DIAGNOSIS — Z0000 Encounter for general adult medical examination without abnormal findings: Secondary | ICD-10-CM | POA: Diagnosis not present

## 2020-07-04 DIAGNOSIS — R2689 Other abnormalities of gait and mobility: Secondary | ICD-10-CM | POA: Diagnosis not present

## 2020-07-04 DIAGNOSIS — I491 Atrial premature depolarization: Secondary | ICD-10-CM | POA: Diagnosis not present

## 2020-07-04 DIAGNOSIS — H9190 Unspecified hearing loss, unspecified ear: Secondary | ICD-10-CM | POA: Diagnosis not present

## 2020-07-04 DIAGNOSIS — I2510 Atherosclerotic heart disease of native coronary artery without angina pectoris: Secondary | ICD-10-CM | POA: Diagnosis not present

## 2020-07-04 DIAGNOSIS — M419 Scoliosis, unspecified: Secondary | ICD-10-CM | POA: Diagnosis not present

## 2020-07-04 DIAGNOSIS — I130 Hypertensive heart and chronic kidney disease with heart failure and stage 1 through stage 4 chronic kidney disease, or unspecified chronic kidney disease: Secondary | ICD-10-CM | POA: Diagnosis not present

## 2020-07-04 DIAGNOSIS — E7849 Other hyperlipidemia: Secondary | ICD-10-CM | POA: Diagnosis not present

## 2020-07-04 DIAGNOSIS — R82998 Other abnormal findings in urine: Secondary | ICD-10-CM | POA: Diagnosis not present

## 2020-07-04 DIAGNOSIS — N1832 Chronic kidney disease, stage 3b: Secondary | ICD-10-CM | POA: Diagnosis not present

## 2020-07-04 DIAGNOSIS — I10 Essential (primary) hypertension: Secondary | ICD-10-CM | POA: Diagnosis not present

## 2020-07-04 DIAGNOSIS — I509 Heart failure, unspecified: Secondary | ICD-10-CM | POA: Diagnosis not present

## 2020-07-04 NOTE — Telephone Encounter (Signed)
Pt c/o medication issue:  1. Name of Medication:   isosorbide mononitrate (IMDUR) 30 MG 24 hr tablet    and hydrALAZINE (APRESOLINE) 10 MG tablet  2. How are you currently taking this medication (dosage and times per day)? As directed  3. Are you having a reaction (difficulty breathing--STAT)? No  4. What is your medication issue? Patient is getting RFA injections in her back tomorrow for scoliosis. Her daughter wants to know if these should have been stopped prior to the injections or if they are ok to take. Please advise.

## 2020-07-04 NOTE — Telephone Encounter (Signed)
Spoke with pt, aware okay to continue medications.

## 2020-07-05 DIAGNOSIS — M47816 Spondylosis without myelopathy or radiculopathy, lumbar region: Secondary | ICD-10-CM | POA: Diagnosis not present

## 2020-07-08 DIAGNOSIS — Z7982 Long term (current) use of aspirin: Secondary | ICD-10-CM | POA: Diagnosis not present

## 2020-07-08 DIAGNOSIS — C679 Malignant neoplasm of bladder, unspecified: Secondary | ICD-10-CM | POA: Diagnosis not present

## 2020-07-08 DIAGNOSIS — I491 Atrial premature depolarization: Secondary | ICD-10-CM | POA: Diagnosis not present

## 2020-07-08 DIAGNOSIS — I2510 Atherosclerotic heart disease of native coronary artery without angina pectoris: Secondary | ICD-10-CM | POA: Diagnosis not present

## 2020-07-08 DIAGNOSIS — Z9181 History of falling: Secondary | ICD-10-CM | POA: Diagnosis not present

## 2020-07-08 DIAGNOSIS — I509 Heart failure, unspecified: Secondary | ICD-10-CM | POA: Diagnosis not present

## 2020-07-08 DIAGNOSIS — D631 Anemia in chronic kidney disease: Secondary | ICD-10-CM | POA: Diagnosis not present

## 2020-07-08 DIAGNOSIS — H9190 Unspecified hearing loss, unspecified ear: Secondary | ICD-10-CM | POA: Diagnosis not present

## 2020-07-08 DIAGNOSIS — R2689 Other abnormalities of gait and mobility: Secondary | ICD-10-CM | POA: Diagnosis not present

## 2020-07-08 DIAGNOSIS — M419 Scoliosis, unspecified: Secondary | ICD-10-CM | POA: Diagnosis not present

## 2020-07-08 DIAGNOSIS — N1832 Chronic kidney disease, stage 3b: Secondary | ICD-10-CM | POA: Diagnosis not present

## 2020-07-08 DIAGNOSIS — G8929 Other chronic pain: Secondary | ICD-10-CM | POA: Diagnosis not present

## 2020-07-08 DIAGNOSIS — D630 Anemia in neoplastic disease: Secondary | ICD-10-CM | POA: Diagnosis not present

## 2020-07-08 DIAGNOSIS — E785 Hyperlipidemia, unspecified: Secondary | ICD-10-CM | POA: Diagnosis not present

## 2020-07-08 DIAGNOSIS — M5136 Other intervertebral disc degeneration, lumbar region: Secondary | ICD-10-CM | POA: Diagnosis not present

## 2020-07-08 DIAGNOSIS — M8580 Other specified disorders of bone density and structure, unspecified site: Secondary | ICD-10-CM | POA: Diagnosis not present

## 2020-07-08 DIAGNOSIS — G4733 Obstructive sleep apnea (adult) (pediatric): Secondary | ICD-10-CM | POA: Diagnosis not present

## 2020-07-08 DIAGNOSIS — Z9049 Acquired absence of other specified parts of digestive tract: Secondary | ICD-10-CM | POA: Diagnosis not present

## 2020-07-08 DIAGNOSIS — I130 Hypertensive heart and chronic kidney disease with heart failure and stage 1 through stage 4 chronic kidney disease, or unspecified chronic kidney disease: Secondary | ICD-10-CM | POA: Diagnosis not present

## 2020-07-13 DIAGNOSIS — C678 Malignant neoplasm of overlapping sites of bladder: Secondary | ICD-10-CM | POA: Diagnosis not present

## 2020-07-14 DIAGNOSIS — M5136 Other intervertebral disc degeneration, lumbar region: Secondary | ICD-10-CM | POA: Diagnosis not present

## 2020-07-14 DIAGNOSIS — G8929 Other chronic pain: Secondary | ICD-10-CM | POA: Diagnosis not present

## 2020-07-14 DIAGNOSIS — R2689 Other abnormalities of gait and mobility: Secondary | ICD-10-CM | POA: Diagnosis not present

## 2020-07-14 DIAGNOSIS — I130 Hypertensive heart and chronic kidney disease with heart failure and stage 1 through stage 4 chronic kidney disease, or unspecified chronic kidney disease: Secondary | ICD-10-CM | POA: Diagnosis not present

## 2020-07-14 DIAGNOSIS — I2510 Atherosclerotic heart disease of native coronary artery without angina pectoris: Secondary | ICD-10-CM | POA: Diagnosis not present

## 2020-07-14 DIAGNOSIS — M419 Scoliosis, unspecified: Secondary | ICD-10-CM | POA: Diagnosis not present

## 2020-07-15 DIAGNOSIS — I2510 Atherosclerotic heart disease of native coronary artery without angina pectoris: Secondary | ICD-10-CM | POA: Diagnosis not present

## 2020-07-15 DIAGNOSIS — M419 Scoliosis, unspecified: Secondary | ICD-10-CM | POA: Diagnosis not present

## 2020-07-15 DIAGNOSIS — M5136 Other intervertebral disc degeneration, lumbar region: Secondary | ICD-10-CM | POA: Diagnosis not present

## 2020-07-15 DIAGNOSIS — G8929 Other chronic pain: Secondary | ICD-10-CM | POA: Diagnosis not present

## 2020-07-15 DIAGNOSIS — I130 Hypertensive heart and chronic kidney disease with heart failure and stage 1 through stage 4 chronic kidney disease, or unspecified chronic kidney disease: Secondary | ICD-10-CM | POA: Diagnosis not present

## 2020-07-15 DIAGNOSIS — R2689 Other abnormalities of gait and mobility: Secondary | ICD-10-CM | POA: Diagnosis not present

## 2020-07-18 DIAGNOSIS — N3000 Acute cystitis without hematuria: Secondary | ICD-10-CM | POA: Diagnosis not present

## 2020-07-20 DIAGNOSIS — R2689 Other abnormalities of gait and mobility: Secondary | ICD-10-CM | POA: Diagnosis not present

## 2020-07-20 DIAGNOSIS — M5136 Other intervertebral disc degeneration, lumbar region: Secondary | ICD-10-CM | POA: Diagnosis not present

## 2020-07-20 DIAGNOSIS — M419 Scoliosis, unspecified: Secondary | ICD-10-CM | POA: Diagnosis not present

## 2020-07-20 DIAGNOSIS — I2510 Atherosclerotic heart disease of native coronary artery without angina pectoris: Secondary | ICD-10-CM | POA: Diagnosis not present

## 2020-07-20 DIAGNOSIS — G8929 Other chronic pain: Secondary | ICD-10-CM | POA: Diagnosis not present

## 2020-07-20 DIAGNOSIS — I130 Hypertensive heart and chronic kidney disease with heart failure and stage 1 through stage 4 chronic kidney disease, or unspecified chronic kidney disease: Secondary | ICD-10-CM | POA: Diagnosis not present

## 2020-07-21 DIAGNOSIS — I2510 Atherosclerotic heart disease of native coronary artery without angina pectoris: Secondary | ICD-10-CM | POA: Diagnosis not present

## 2020-07-21 DIAGNOSIS — I130 Hypertensive heart and chronic kidney disease with heart failure and stage 1 through stage 4 chronic kidney disease, or unspecified chronic kidney disease: Secondary | ICD-10-CM | POA: Diagnosis not present

## 2020-07-21 DIAGNOSIS — M5136 Other intervertebral disc degeneration, lumbar region: Secondary | ICD-10-CM | POA: Diagnosis not present

## 2020-07-21 DIAGNOSIS — G8929 Other chronic pain: Secondary | ICD-10-CM | POA: Diagnosis not present

## 2020-07-21 DIAGNOSIS — R2689 Other abnormalities of gait and mobility: Secondary | ICD-10-CM | POA: Diagnosis not present

## 2020-07-21 DIAGNOSIS — M419 Scoliosis, unspecified: Secondary | ICD-10-CM | POA: Diagnosis not present

## 2020-07-26 DIAGNOSIS — I2510 Atherosclerotic heart disease of native coronary artery without angina pectoris: Secondary | ICD-10-CM | POA: Diagnosis not present

## 2020-07-26 DIAGNOSIS — M5136 Other intervertebral disc degeneration, lumbar region: Secondary | ICD-10-CM | POA: Diagnosis not present

## 2020-07-26 DIAGNOSIS — M419 Scoliosis, unspecified: Secondary | ICD-10-CM | POA: Diagnosis not present

## 2020-07-26 DIAGNOSIS — G8929 Other chronic pain: Secondary | ICD-10-CM | POA: Diagnosis not present

## 2020-07-26 DIAGNOSIS — I130 Hypertensive heart and chronic kidney disease with heart failure and stage 1 through stage 4 chronic kidney disease, or unspecified chronic kidney disease: Secondary | ICD-10-CM | POA: Diagnosis not present

## 2020-07-26 DIAGNOSIS — R2689 Other abnormalities of gait and mobility: Secondary | ICD-10-CM | POA: Diagnosis not present

## 2020-07-28 DIAGNOSIS — M419 Scoliosis, unspecified: Secondary | ICD-10-CM | POA: Diagnosis not present

## 2020-07-28 DIAGNOSIS — G8929 Other chronic pain: Secondary | ICD-10-CM | POA: Diagnosis not present

## 2020-07-28 DIAGNOSIS — R2689 Other abnormalities of gait and mobility: Secondary | ICD-10-CM | POA: Diagnosis not present

## 2020-07-28 DIAGNOSIS — M5136 Other intervertebral disc degeneration, lumbar region: Secondary | ICD-10-CM | POA: Diagnosis not present

## 2020-07-28 DIAGNOSIS — I2510 Atherosclerotic heart disease of native coronary artery without angina pectoris: Secondary | ICD-10-CM | POA: Diagnosis not present

## 2020-07-28 DIAGNOSIS — I130 Hypertensive heart and chronic kidney disease with heart failure and stage 1 through stage 4 chronic kidney disease, or unspecified chronic kidney disease: Secondary | ICD-10-CM | POA: Diagnosis not present

## 2020-08-02 DIAGNOSIS — M47816 Spondylosis without myelopathy or radiculopathy, lumbar region: Secondary | ICD-10-CM | POA: Diagnosis not present

## 2020-08-03 DIAGNOSIS — I2510 Atherosclerotic heart disease of native coronary artery without angina pectoris: Secondary | ICD-10-CM | POA: Diagnosis not present

## 2020-08-03 DIAGNOSIS — M419 Scoliosis, unspecified: Secondary | ICD-10-CM | POA: Diagnosis not present

## 2020-08-03 DIAGNOSIS — I130 Hypertensive heart and chronic kidney disease with heart failure and stage 1 through stage 4 chronic kidney disease, or unspecified chronic kidney disease: Secondary | ICD-10-CM | POA: Diagnosis not present

## 2020-08-03 DIAGNOSIS — R2689 Other abnormalities of gait and mobility: Secondary | ICD-10-CM | POA: Diagnosis not present

## 2020-08-03 DIAGNOSIS — M5136 Other intervertebral disc degeneration, lumbar region: Secondary | ICD-10-CM | POA: Diagnosis not present

## 2020-08-03 DIAGNOSIS — G8929 Other chronic pain: Secondary | ICD-10-CM | POA: Diagnosis not present

## 2020-08-04 DIAGNOSIS — I2510 Atherosclerotic heart disease of native coronary artery without angina pectoris: Secondary | ICD-10-CM | POA: Diagnosis not present

## 2020-08-04 DIAGNOSIS — R2689 Other abnormalities of gait and mobility: Secondary | ICD-10-CM | POA: Diagnosis not present

## 2020-08-04 DIAGNOSIS — M5136 Other intervertebral disc degeneration, lumbar region: Secondary | ICD-10-CM | POA: Diagnosis not present

## 2020-08-04 DIAGNOSIS — I130 Hypertensive heart and chronic kidney disease with heart failure and stage 1 through stage 4 chronic kidney disease, or unspecified chronic kidney disease: Secondary | ICD-10-CM | POA: Diagnosis not present

## 2020-08-04 DIAGNOSIS — M419 Scoliosis, unspecified: Secondary | ICD-10-CM | POA: Diagnosis not present

## 2020-08-04 DIAGNOSIS — G8929 Other chronic pain: Secondary | ICD-10-CM | POA: Diagnosis not present

## 2020-08-07 DIAGNOSIS — E785 Hyperlipidemia, unspecified: Secondary | ICD-10-CM | POA: Diagnosis not present

## 2020-08-07 DIAGNOSIS — R2689 Other abnormalities of gait and mobility: Secondary | ICD-10-CM | POA: Diagnosis not present

## 2020-08-07 DIAGNOSIS — I130 Hypertensive heart and chronic kidney disease with heart failure and stage 1 through stage 4 chronic kidney disease, or unspecified chronic kidney disease: Secondary | ICD-10-CM | POA: Diagnosis not present

## 2020-08-07 DIAGNOSIS — H9190 Unspecified hearing loss, unspecified ear: Secondary | ICD-10-CM | POA: Diagnosis not present

## 2020-08-07 DIAGNOSIS — Z9049 Acquired absence of other specified parts of digestive tract: Secondary | ICD-10-CM | POA: Diagnosis not present

## 2020-08-07 DIAGNOSIS — Z7982 Long term (current) use of aspirin: Secondary | ICD-10-CM | POA: Diagnosis not present

## 2020-08-07 DIAGNOSIS — C679 Malignant neoplasm of bladder, unspecified: Secondary | ICD-10-CM | POA: Diagnosis not present

## 2020-08-07 DIAGNOSIS — Z9181 History of falling: Secondary | ICD-10-CM | POA: Diagnosis not present

## 2020-08-07 DIAGNOSIS — M419 Scoliosis, unspecified: Secondary | ICD-10-CM | POA: Diagnosis not present

## 2020-08-07 DIAGNOSIS — G8929 Other chronic pain: Secondary | ICD-10-CM | POA: Diagnosis not present

## 2020-08-07 DIAGNOSIS — D631 Anemia in chronic kidney disease: Secondary | ICD-10-CM | POA: Diagnosis not present

## 2020-08-07 DIAGNOSIS — M5136 Other intervertebral disc degeneration, lumbar region: Secondary | ICD-10-CM | POA: Diagnosis not present

## 2020-08-07 DIAGNOSIS — D630 Anemia in neoplastic disease: Secondary | ICD-10-CM | POA: Diagnosis not present

## 2020-08-07 DIAGNOSIS — I2510 Atherosclerotic heart disease of native coronary artery without angina pectoris: Secondary | ICD-10-CM | POA: Diagnosis not present

## 2020-08-07 DIAGNOSIS — M8580 Other specified disorders of bone density and structure, unspecified site: Secondary | ICD-10-CM | POA: Diagnosis not present

## 2020-08-07 DIAGNOSIS — I509 Heart failure, unspecified: Secondary | ICD-10-CM | POA: Diagnosis not present

## 2020-08-07 DIAGNOSIS — G4733 Obstructive sleep apnea (adult) (pediatric): Secondary | ICD-10-CM | POA: Diagnosis not present

## 2020-08-07 DIAGNOSIS — I491 Atrial premature depolarization: Secondary | ICD-10-CM | POA: Diagnosis not present

## 2020-08-07 DIAGNOSIS — N1832 Chronic kidney disease, stage 3b: Secondary | ICD-10-CM | POA: Diagnosis not present

## 2020-08-09 DIAGNOSIS — N3000 Acute cystitis without hematuria: Secondary | ICD-10-CM | POA: Diagnosis not present

## 2020-08-10 DIAGNOSIS — M5136 Other intervertebral disc degeneration, lumbar region: Secondary | ICD-10-CM | POA: Diagnosis not present

## 2020-08-10 DIAGNOSIS — M419 Scoliosis, unspecified: Secondary | ICD-10-CM | POA: Diagnosis not present

## 2020-08-10 DIAGNOSIS — G8929 Other chronic pain: Secondary | ICD-10-CM | POA: Diagnosis not present

## 2020-08-10 DIAGNOSIS — R2689 Other abnormalities of gait and mobility: Secondary | ICD-10-CM | POA: Diagnosis not present

## 2020-08-10 DIAGNOSIS — I2510 Atherosclerotic heart disease of native coronary artery without angina pectoris: Secondary | ICD-10-CM | POA: Diagnosis not present

## 2020-08-10 DIAGNOSIS — I130 Hypertensive heart and chronic kidney disease with heart failure and stage 1 through stage 4 chronic kidney disease, or unspecified chronic kidney disease: Secondary | ICD-10-CM | POA: Diagnosis not present

## 2020-08-16 DIAGNOSIS — M47816 Spondylosis without myelopathy or radiculopathy, lumbar region: Secondary | ICD-10-CM | POA: Diagnosis not present

## 2020-08-18 DIAGNOSIS — G8929 Other chronic pain: Secondary | ICD-10-CM | POA: Diagnosis not present

## 2020-08-18 DIAGNOSIS — R2689 Other abnormalities of gait and mobility: Secondary | ICD-10-CM | POA: Diagnosis not present

## 2020-08-18 DIAGNOSIS — M419 Scoliosis, unspecified: Secondary | ICD-10-CM | POA: Diagnosis not present

## 2020-08-18 DIAGNOSIS — I130 Hypertensive heart and chronic kidney disease with heart failure and stage 1 through stage 4 chronic kidney disease, or unspecified chronic kidney disease: Secondary | ICD-10-CM | POA: Diagnosis not present

## 2020-08-18 DIAGNOSIS — I2510 Atherosclerotic heart disease of native coronary artery without angina pectoris: Secondary | ICD-10-CM | POA: Diagnosis not present

## 2020-08-18 DIAGNOSIS — M5136 Other intervertebral disc degeneration, lumbar region: Secondary | ICD-10-CM | POA: Diagnosis not present

## 2020-08-25 DIAGNOSIS — G8929 Other chronic pain: Secondary | ICD-10-CM | POA: Diagnosis not present

## 2020-08-25 DIAGNOSIS — I2510 Atherosclerotic heart disease of native coronary artery without angina pectoris: Secondary | ICD-10-CM | POA: Diagnosis not present

## 2020-08-25 DIAGNOSIS — I130 Hypertensive heart and chronic kidney disease with heart failure and stage 1 through stage 4 chronic kidney disease, or unspecified chronic kidney disease: Secondary | ICD-10-CM | POA: Diagnosis not present

## 2020-08-25 DIAGNOSIS — R2689 Other abnormalities of gait and mobility: Secondary | ICD-10-CM | POA: Diagnosis not present

## 2020-08-25 DIAGNOSIS — M419 Scoliosis, unspecified: Secondary | ICD-10-CM | POA: Diagnosis not present

## 2020-08-25 DIAGNOSIS — M5136 Other intervertebral disc degeneration, lumbar region: Secondary | ICD-10-CM | POA: Diagnosis not present

## 2020-08-30 DIAGNOSIS — N3000 Acute cystitis without hematuria: Secondary | ICD-10-CM | POA: Diagnosis not present

## 2020-09-01 DIAGNOSIS — R2689 Other abnormalities of gait and mobility: Secondary | ICD-10-CM | POA: Diagnosis not present

## 2020-09-01 DIAGNOSIS — G8929 Other chronic pain: Secondary | ICD-10-CM | POA: Diagnosis not present

## 2020-09-01 DIAGNOSIS — M419 Scoliosis, unspecified: Secondary | ICD-10-CM | POA: Diagnosis not present

## 2020-09-01 DIAGNOSIS — I130 Hypertensive heart and chronic kidney disease with heart failure and stage 1 through stage 4 chronic kidney disease, or unspecified chronic kidney disease: Secondary | ICD-10-CM | POA: Diagnosis not present

## 2020-09-01 DIAGNOSIS — M5136 Other intervertebral disc degeneration, lumbar region: Secondary | ICD-10-CM | POA: Diagnosis not present

## 2020-09-01 DIAGNOSIS — I2510 Atherosclerotic heart disease of native coronary artery without angina pectoris: Secondary | ICD-10-CM | POA: Diagnosis not present

## 2020-09-06 DIAGNOSIS — M1990 Unspecified osteoarthritis, unspecified site: Secondary | ICD-10-CM | POA: Diagnosis not present

## 2020-09-06 DIAGNOSIS — G4733 Obstructive sleep apnea (adult) (pediatric): Secondary | ICD-10-CM | POA: Diagnosis not present

## 2020-09-06 DIAGNOSIS — M8580 Other specified disorders of bone density and structure, unspecified site: Secondary | ICD-10-CM | POA: Diagnosis not present

## 2020-09-06 DIAGNOSIS — H9190 Unspecified hearing loss, unspecified ear: Secondary | ICD-10-CM | POA: Diagnosis not present

## 2020-09-06 DIAGNOSIS — D630 Anemia in neoplastic disease: Secondary | ICD-10-CM | POA: Diagnosis not present

## 2020-09-06 DIAGNOSIS — M5136 Other intervertebral disc degeneration, lumbar region: Secondary | ICD-10-CM | POA: Diagnosis not present

## 2020-09-06 DIAGNOSIS — E785 Hyperlipidemia, unspecified: Secondary | ICD-10-CM | POA: Diagnosis not present

## 2020-09-06 DIAGNOSIS — D631 Anemia in chronic kidney disease: Secondary | ICD-10-CM | POA: Diagnosis not present

## 2020-09-06 DIAGNOSIS — Z9049 Acquired absence of other specified parts of digestive tract: Secondary | ICD-10-CM | POA: Diagnosis not present

## 2020-09-06 DIAGNOSIS — Z7982 Long term (current) use of aspirin: Secondary | ICD-10-CM | POA: Diagnosis not present

## 2020-09-06 DIAGNOSIS — I130 Hypertensive heart and chronic kidney disease with heart failure and stage 1 through stage 4 chronic kidney disease, or unspecified chronic kidney disease: Secondary | ICD-10-CM | POA: Diagnosis not present

## 2020-09-06 DIAGNOSIS — I491 Atrial premature depolarization: Secondary | ICD-10-CM | POA: Diagnosis not present

## 2020-09-06 DIAGNOSIS — G8929 Other chronic pain: Secondary | ICD-10-CM | POA: Diagnosis not present

## 2020-09-06 DIAGNOSIS — I509 Heart failure, unspecified: Secondary | ICD-10-CM | POA: Diagnosis not present

## 2020-09-06 DIAGNOSIS — N1832 Chronic kidney disease, stage 3b: Secondary | ICD-10-CM | POA: Diagnosis not present

## 2020-09-06 DIAGNOSIS — M419 Scoliosis, unspecified: Secondary | ICD-10-CM | POA: Diagnosis not present

## 2020-09-06 DIAGNOSIS — I2510 Atherosclerotic heart disease of native coronary artery without angina pectoris: Secondary | ICD-10-CM | POA: Diagnosis not present

## 2020-09-06 DIAGNOSIS — C679 Malignant neoplasm of bladder, unspecified: Secondary | ICD-10-CM | POA: Diagnosis not present

## 2020-09-06 DIAGNOSIS — R2689 Other abnormalities of gait and mobility: Secondary | ICD-10-CM | POA: Diagnosis not present

## 2020-09-06 DIAGNOSIS — Z9181 History of falling: Secondary | ICD-10-CM | POA: Diagnosis not present

## 2020-09-14 DIAGNOSIS — N1832 Chronic kidney disease, stage 3b: Secondary | ICD-10-CM | POA: Diagnosis not present

## 2020-09-14 DIAGNOSIS — M5136 Other intervertebral disc degeneration, lumbar region: Secondary | ICD-10-CM | POA: Diagnosis not present

## 2020-09-14 DIAGNOSIS — D631 Anemia in chronic kidney disease: Secondary | ICD-10-CM | POA: Diagnosis not present

## 2020-09-14 DIAGNOSIS — I2510 Atherosclerotic heart disease of native coronary artery without angina pectoris: Secondary | ICD-10-CM | POA: Diagnosis not present

## 2020-09-14 DIAGNOSIS — I130 Hypertensive heart and chronic kidney disease with heart failure and stage 1 through stage 4 chronic kidney disease, or unspecified chronic kidney disease: Secondary | ICD-10-CM | POA: Diagnosis not present

## 2020-09-14 DIAGNOSIS — I509 Heart failure, unspecified: Secondary | ICD-10-CM | POA: Diagnosis not present

## 2020-09-21 DIAGNOSIS — I130 Hypertensive heart and chronic kidney disease with heart failure and stage 1 through stage 4 chronic kidney disease, or unspecified chronic kidney disease: Secondary | ICD-10-CM | POA: Diagnosis not present

## 2020-09-21 DIAGNOSIS — M5136 Other intervertebral disc degeneration, lumbar region: Secondary | ICD-10-CM | POA: Diagnosis not present

## 2020-09-21 DIAGNOSIS — N1832 Chronic kidney disease, stage 3b: Secondary | ICD-10-CM | POA: Diagnosis not present

## 2020-09-21 DIAGNOSIS — I2510 Atherosclerotic heart disease of native coronary artery without angina pectoris: Secondary | ICD-10-CM | POA: Diagnosis not present

## 2020-09-21 DIAGNOSIS — I509 Heart failure, unspecified: Secondary | ICD-10-CM | POA: Diagnosis not present

## 2020-09-21 DIAGNOSIS — D631 Anemia in chronic kidney disease: Secondary | ICD-10-CM | POA: Diagnosis not present

## 2020-09-22 NOTE — Progress Notes (Signed)
Cardiology Office Note:    Date:  09/27/2020   ID:  Paige Cook, DOB 1928/05/29, MRN [id number]  PCP:  Paige Bunting, MD  Cardiologist:  Paige Crayton [place], MD  Electrophysiologist:  None   Referring MD: Paige Bunting, MD   Chief Complaint  Patient presents with  . Congestive Heart Failure    History of Present Illness:    Paige Cook is a 86 y.o. female with a hx of nonischemic cardiomyopathy, PACs, CAD, hyperlipidemia, and OSA on CPAP.  She is seen for follow up CHF. She is seen with her daughter. Previous cardiac catheterization on 01/10/2010 showed EF 30%, 40 to 50% proximal LAD lesion, 30% mid LAD lesion, otherwise normal left circumflex, and left main and RCA.  Patient had EF of 40% in 2012.  Subsequent echocardiogram in 2013 showed EF of 45%.  He has history of very frequent PACs on the Holter monitor however no atrial fibrillation was identified.    Patient was seen in February 2020 and noted to have mild edema however otherwise asymptomatic.  In August he presented with symptom of PND.  Her Lasix was increased.  Echocardiogram obtained showed decreased LVEF to 20% with moderate MR.  It was noted she was noncompliant with CPAP therapy.  Symptoms did not improve with increased Lasix.  She was seen by Dr. Caryl Cook who felt she was not a candidate for prophylactic ICD due to age and comorbidity.  Titration of medication was limited by CKD and her low BP.  Due to worsening renal function in August, Lasix was reduced to 3 times a week.  She later reported increased edema and shortness of breath.  Lasix was eventually increased back to daily dosing and symptoms improved. Was seen by Paige Deforest PA-C on 12/16/2019.  She was felt to be volume overloaded and gained about 8 pounds within 1 week.  He increased  her Lasix to 40 mg daily. She initially improved but then had more fluid retention. Her lasix was increased to 60 mg for three days. When seen back  she complained of breathlessness and  increased edema in legs and some abdominal swelling. No cough. No chest pain. States her CPAP isn't working well "blowing too much air". Is scheduled for follow up with her sleep physician on Feb 10. She has been more diligent with sodium restriction over the past week with daughter's help. She was started on low dose nitrates and hydralazine.   On follow up today she is doing well. Seen with her daughter. Now on lasix 20 mg daily with extra 20 mg if needed. Weight is stable. Mild ankle swelling during the day.  States breathing is doing good.    Past Medical History:  Diagnosis Date  . Allergic rhinitis   . Anemia   . Arthritis   . Bladder cancer ([place])    giant tumor  . CAD (coronary artery disease)   . Cardiac arrhythmia    benign  . Carotid bruit    left  . CHF (congestive heart failure) (HCC)    CHF with systolic dysfuntion ; dilated nonishemic cardiomyopathy  . Colon cancer ([place]) 1989  . Colon polyps   . Diverticulosis   . Epistaxis   . Female bladder prolapse   . Gallstones   . Heart failure ([place])   . Hypercholesteremia   . Hyponatremia    Probably secondary to her CHF  . OSA (obstructive sleep apnea) 04/29/2012  . SOB (shortness of breath)    Sometimes at  night    Past Surgical History:  Procedure Laterality Date  . ABDOMINAL HYSTERECTOMY    . BLADDER SURGERY  2013   cancer removed  . bladder tact    . CARDIAC CATHETERIZATION     non obstruction CAD  . CHOLECYSTECTOMY    . 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 COLONOSCOPY      Current Medications: Current Meds  Medication Sig  . aspirin 81 MG chewable tablet Chew 81 mg by mouth daily with breakfast.  . carvedilol (COREG) 6.25 MG tablet TAKE 1 TABLET BY MOUTH TWICE A DAY WITH MEALS  . Cholecalciferol (VITAMIN D3) 50 MCG (2000 UT) TABS Take 50 mcg by mouth.  . Cyanocobalamin (B-12 PO) Take by mouth 3 (three) times a week.   . diclofenac Sodium (VOLTAREN) 1 % GEL Apply 2 g topically 2 (two) times daily. As  needed  . furosemide (LASIX) 40 MG tablet Take 1/2 tablet 20 mg daily  . hydrALAZINE (APRESOLINE) 10 MG tablet Take 1 tablet (10 mg total) by mouth 3 (three) times daily.  . isosorbide mononitrate (IMDUR) 30 MG 24 hr tablet Take 1 tablet (30 mg total) by mouth daily.  . vitamin C (ASCORBIC ACID) 250 MG tablet Take 500 mg by mouth daily with breakfast.      Allergies:   Penicillins and Tramadol   Social History   Socioeconomic History  . Marital status: Married    Spouse name: Not on file  . Number of children: 4  . Years of education: Not on file  . Highest education level: Not on file  Occupational History  . Occupation: retired Paramedic  Tobacco Use  . Smoking status: Never Smoker  . Smokeless tobacco: Never Used  Substance and Sexual Activity  . Alcohol use: No  . Drug use: No  . Sexual activity: Not Currently  Other Topics Concern  . Not on file  Social History Narrative   Married, 1 son and 3 daughters.    One coffee a day no alcohol no tobacco   05/05/2015   Social Determinants of Health   Financial Resource Strain:   . Difficulty of Paying Living Expenses: Not on file  Food Insecurity:   . Worried About Charity fundraiser in the Last Year: Not on file  . Ran Out of Food in the Last Year: Not on file  Transportation Needs:   . Lack of Transportation (Medical): Not on file  . Lack of Transportation (Non-Medical): Not on file  Physical Activity:   . Days of Exercise per Week: Not on file  . Minutes of Exercise per Session: Not on file  Stress:   . Feeling of Stress : Not on file  Social Connections:   . Frequency of Communication with Friends and Family: Not on file  . Frequency of Social Gatherings with Friends and Family: Not on file  . Attends Religious Services: Not on file  . Active Member of Clubs or Organizations: Not on file  . Attends Archivist Meetings: Not on file  . Marital Status: Not on file     Family History: The  patient's family history includes Heart attack in her father and mother; Heart disease in her father; Heart failure (age of onset: 4) in her sister. There is no history of Stroke.  ROS:   Please see the history of present illness.     All other systems reviewed and are negative.  EKGs/Labs/Other Studies Reviewed:  The following studies were reviewed today:  Cath 01/10/2010    Echo 07/30/2019 IMPRESSIONS   1. The left ventricle has a visually estimated ejection fraction of 20%. The cavity size was severely dilated. Left ventricular diastolic Doppler parameters are consistent with restrictive filling. Elevated left ventricular end-diastolic pressure Left  ventrical global hypokinesis without regional wall motion abnormalities. 2. The right ventricle has normal systolic function. The cavity was normal. There is no increase in right ventricular wall thickness. 3. Left atrial size was moderately dilated. 4. Right atrial size was mildly dilated. 5. Moderate thickening of the mitral valve leaflet. Mild calcification of the mitral valve leaflet. There is mild mitral annular calcification present. Mitral valve regurgitation is moderate by color flow Doppler. 6. The aortic valve is tricuspid. Moderate thickening of the aortic valve. Sclerosis without any evidence of stenosis of the aortic valve. Aortic valve regurgitation is mild by color flow Doppler. 7. The aorta is normal unless otherwise noted.  EKG:  EKG is not ordered today.   Recent Labs: 10/20/2019: BNP 2,519.0 01/28/2020: ALT 25; Magnesium 2.4 05/25/2020: BUN 96; Creatinine, Ser 1.96; Potassium 4.5; Sodium 139  Recent Lipid Panel    Component Value Date/Time   CHOL 164 01/28/2020 0940   TRIG 64 01/28/2020 0940   HDL 60 01/28/2020 0940   CHOLHDL 2.7 01/28/2020 0940   CHOLHDL 2.2 11/03/2015 1042   VLDL 15 11/03/2015 1042   LDLCALC 91 01/28/2020 0940   LDLDIRECT 152.9 03/06/2013 0924   Dated 06/22/19: cholesterol 178,  triglycerides 51, HDL 64, LDL 104. LFTs normal. Hgb 11. TSH normal Dated 12/24/19: creatinine 1.89. potassium 5.2.  Dated 06/27/20: BUN 88, creatinine 1.6. cholesterol 177, triglycerides 73, HDL 70, LDL 92. Hgb 10.8. plt 177K. TSH normal.   Physical Exam:    VS:  BP 140/60   Pulse 70   Ht 5\' 3"  (1.6 m)   Wt 106 lb (48.1 kg)   SpO2 97%   BMI 18.78 kg/m     Wt Readings from Last 3 Encounters:  09/27/20 106 lb (48.1 kg)  05/27/20 103 lb 12.8 oz (47.1 kg)  02/17/20 106 lb 6.4 oz (48.3 kg)     GEN:  Elderly WF  in no acute distress HEENT: Normal NECK: no JVD; No carotid bruits Back: kyphoscoliosis CARDIAC: RRR, no murmurs, rubs, gallops RESPIRATORY:  Clear to auscultation without rales, wheezing or rhonchi  ABDOMEN: Soft, non-tender, non-distended MUSCULOSKELETAL:  Tr ankle edema; marked scoliosis SKIN: Warm and dry NEUROLOGIC:  Alert and oriented x 3 PSYCHIATRIC:  Normal affect   ASSESSMENT:    1. NICM (nonischemic cardiomyopathy) ([place])   2. Chronic systolic HF (heart failure) ([place])   3. Essential hypertension   4. Stage 3b chronic kidney disease ([place])    PLAN:    In order of problems listed above:  1. Chronic systolic heart failure: she is doing well on lasix 20 mg daily.   On Imdur 30 mg daily and hydralazine 5 mg tid. Not a candidate for ACEi/ARV,ARNI, or spironolactone due to CKD.  Sodium restriction. Will check a BMET today.  2. Nonischemic cardiomyopathy: Previous echocardiogram in March 2020 showed worsening EF of 20%.  Poor long-term prognosis.  3. CAD: No  chest pain  4. Hyperlipidemia: LDL 91.   5.   CKD stage 3b. Monitor with BMET today.   Medication Adjustments/Labs and Tests Ordered: Current medicines are reviewed at length with the patient today.  Concerns regarding medicines are outlined above.  No orders of the defined types  were placed in this encounter.  No orders of the defined types were placed in this encounter.   There are no Patient  Instructions on file for this visit.   Signed, Anum Palecek Martinique, MD  09/27/2020 2:12 PM    [facility]

## 2020-09-23 DIAGNOSIS — Z23 Encounter for immunization: Secondary | ICD-10-CM | POA: Diagnosis not present

## 2020-09-27 ENCOUNTER — Other Ambulatory Visit: Payer: Self-pay

## 2020-09-27 ENCOUNTER — Ambulatory Visit (INDEPENDENT_AMBULATORY_CARE_PROVIDER_SITE_OTHER): Payer: Medicare Other | Admitting: Cardiology

## 2020-09-27 ENCOUNTER — Encounter: Payer: Self-pay | Admitting: Cardiology

## 2020-09-27 VITALS — BP 140/60 | HR 70 | Ht 63.0 in | Wt 106.0 lb

## 2020-09-27 DIAGNOSIS — I2510 Atherosclerotic heart disease of native coronary artery without angina pectoris: Secondary | ICD-10-CM

## 2020-09-27 DIAGNOSIS — I428 Other cardiomyopathies: Secondary | ICD-10-CM | POA: Diagnosis not present

## 2020-09-27 DIAGNOSIS — N1832 Chronic kidney disease, stage 3b: Secondary | ICD-10-CM | POA: Diagnosis not present

## 2020-09-27 DIAGNOSIS — I10 Essential (primary) hypertension: Secondary | ICD-10-CM

## 2020-09-27 DIAGNOSIS — I5022 Chronic systolic (congestive) heart failure: Secondary | ICD-10-CM | POA: Diagnosis not present

## 2020-09-27 NOTE — Addendum Note (Signed)
Addended by: Kathyrn Lass on: 09/27/2020 02:16 PM   Modules accepted: Orders

## 2020-09-28 DIAGNOSIS — I130 Hypertensive heart and chronic kidney disease with heart failure and stage 1 through stage 4 chronic kidney disease, or unspecified chronic kidney disease: Secondary | ICD-10-CM | POA: Diagnosis not present

## 2020-09-28 DIAGNOSIS — N1832 Chronic kidney disease, stage 3b: Secondary | ICD-10-CM | POA: Diagnosis not present

## 2020-09-28 DIAGNOSIS — I2510 Atherosclerotic heart disease of native coronary artery without angina pectoris: Secondary | ICD-10-CM | POA: Diagnosis not present

## 2020-09-28 DIAGNOSIS — D631 Anemia in chronic kidney disease: Secondary | ICD-10-CM | POA: Diagnosis not present

## 2020-09-28 DIAGNOSIS — M5136 Other intervertebral disc degeneration, lumbar region: Secondary | ICD-10-CM | POA: Diagnosis not present

## 2020-09-28 DIAGNOSIS — I509 Heart failure, unspecified: Secondary | ICD-10-CM | POA: Diagnosis not present

## 2020-09-28 LAB — BASIC METABOLIC PANEL
BUN/Creatinine Ratio: 46 — ABNORMAL HIGH (ref 12–28)
BUN: 81 mg/dL (ref 10–36)
CO2: 18 mmol/L — ABNORMAL LOW (ref 20–29)
Calcium: 9.4 mg/dL (ref 8.7–10.3)
Chloride: 105 mmol/L (ref 96–106)
Creatinine, Ser: 1.75 mg/dL — ABNORMAL HIGH (ref 0.57–1.00)
GFR calc Af Amer: 29 mL/min/{1.73_m2} — ABNORMAL LOW (ref >59)
GFR calc non Af Amer: 25 mL/min/{1.73_m2} — ABNORMAL LOW (ref >59)
Glucose: 120 mg/dL — ABNORMAL HIGH (ref 65–99)
Potassium: 4.7 mmol/L (ref 3.5–5.2)
Sodium: 139 mmol/L (ref 134–144)

## 2020-10-06 DIAGNOSIS — I2510 Atherosclerotic heart disease of native coronary artery without angina pectoris: Secondary | ICD-10-CM | POA: Diagnosis not present

## 2020-10-06 DIAGNOSIS — I509 Heart failure, unspecified: Secondary | ICD-10-CM | POA: Diagnosis not present

## 2020-10-06 DIAGNOSIS — M1990 Unspecified osteoarthritis, unspecified site: Secondary | ICD-10-CM | POA: Diagnosis not present

## 2020-10-06 DIAGNOSIS — H9190 Unspecified hearing loss, unspecified ear: Secondary | ICD-10-CM | POA: Diagnosis not present

## 2020-10-06 DIAGNOSIS — I130 Hypertensive heart and chronic kidney disease with heart failure and stage 1 through stage 4 chronic kidney disease, or unspecified chronic kidney disease: Secondary | ICD-10-CM | POA: Diagnosis not present

## 2020-10-06 DIAGNOSIS — M8580 Other specified disorders of bone density and structure, unspecified site: Secondary | ICD-10-CM | POA: Diagnosis not present

## 2020-10-06 DIAGNOSIS — N1832 Chronic kidney disease, stage 3b: Secondary | ICD-10-CM | POA: Diagnosis not present

## 2020-10-06 DIAGNOSIS — I491 Atrial premature depolarization: Secondary | ICD-10-CM | POA: Diagnosis not present

## 2020-10-06 DIAGNOSIS — M5136 Other intervertebral disc degeneration, lumbar region: Secondary | ICD-10-CM | POA: Diagnosis not present

## 2020-10-06 DIAGNOSIS — M419 Scoliosis, unspecified: Secondary | ICD-10-CM | POA: Diagnosis not present

## 2020-10-06 DIAGNOSIS — G4733 Obstructive sleep apnea (adult) (pediatric): Secondary | ICD-10-CM | POA: Diagnosis not present

## 2020-10-06 DIAGNOSIS — E785 Hyperlipidemia, unspecified: Secondary | ICD-10-CM | POA: Diagnosis not present

## 2020-10-06 DIAGNOSIS — D631 Anemia in chronic kidney disease: Secondary | ICD-10-CM | POA: Diagnosis not present

## 2020-10-07 DIAGNOSIS — D631 Anemia in chronic kidney disease: Secondary | ICD-10-CM | POA: Diagnosis not present

## 2020-10-07 DIAGNOSIS — I130 Hypertensive heart and chronic kidney disease with heart failure and stage 1 through stage 4 chronic kidney disease, or unspecified chronic kidney disease: Secondary | ICD-10-CM | POA: Diagnosis not present

## 2020-10-07 DIAGNOSIS — M5136 Other intervertebral disc degeneration, lumbar region: Secondary | ICD-10-CM | POA: Diagnosis not present

## 2020-10-07 DIAGNOSIS — N1832 Chronic kidney disease, stage 3b: Secondary | ICD-10-CM | POA: Diagnosis not present

## 2020-10-07 DIAGNOSIS — I509 Heart failure, unspecified: Secondary | ICD-10-CM | POA: Diagnosis not present

## 2020-10-07 DIAGNOSIS — I2510 Atherosclerotic heart disease of native coronary artery without angina pectoris: Secondary | ICD-10-CM | POA: Diagnosis not present

## 2020-10-10 DIAGNOSIS — R8271 Bacteriuria: Secondary | ICD-10-CM | POA: Diagnosis not present

## 2020-10-10 DIAGNOSIS — N3000 Acute cystitis without hematuria: Secondary | ICD-10-CM | POA: Diagnosis not present

## 2020-10-12 DIAGNOSIS — I130 Hypertensive heart and chronic kidney disease with heart failure and stage 1 through stage 4 chronic kidney disease, or unspecified chronic kidney disease: Secondary | ICD-10-CM | POA: Diagnosis not present

## 2020-10-12 DIAGNOSIS — D631 Anemia in chronic kidney disease: Secondary | ICD-10-CM | POA: Diagnosis not present

## 2020-10-12 DIAGNOSIS — I2510 Atherosclerotic heart disease of native coronary artery without angina pectoris: Secondary | ICD-10-CM | POA: Diagnosis not present

## 2020-10-12 DIAGNOSIS — I509 Heart failure, unspecified: Secondary | ICD-10-CM | POA: Diagnosis not present

## 2020-10-12 DIAGNOSIS — N1832 Chronic kidney disease, stage 3b: Secondary | ICD-10-CM | POA: Diagnosis not present

## 2020-10-12 DIAGNOSIS — M5136 Other intervertebral disc degeneration, lumbar region: Secondary | ICD-10-CM | POA: Diagnosis not present

## 2020-10-26 DIAGNOSIS — M5136 Other intervertebral disc degeneration, lumbar region: Secondary | ICD-10-CM | POA: Diagnosis not present

## 2020-10-26 DIAGNOSIS — I130 Hypertensive heart and chronic kidney disease with heart failure and stage 1 through stage 4 chronic kidney disease, or unspecified chronic kidney disease: Secondary | ICD-10-CM | POA: Diagnosis not present

## 2020-10-26 DIAGNOSIS — D631 Anemia in chronic kidney disease: Secondary | ICD-10-CM | POA: Diagnosis not present

## 2020-10-26 DIAGNOSIS — I509 Heart failure, unspecified: Secondary | ICD-10-CM | POA: Diagnosis not present

## 2020-10-26 DIAGNOSIS — N1832 Chronic kidney disease, stage 3b: Secondary | ICD-10-CM | POA: Diagnosis not present

## 2020-10-26 DIAGNOSIS — I2510 Atherosclerotic heart disease of native coronary artery without angina pectoris: Secondary | ICD-10-CM | POA: Diagnosis not present

## 2020-11-01 DIAGNOSIS — D631 Anemia in chronic kidney disease: Secondary | ICD-10-CM | POA: Diagnosis not present

## 2020-11-01 DIAGNOSIS — I2510 Atherosclerotic heart disease of native coronary artery without angina pectoris: Secondary | ICD-10-CM | POA: Diagnosis not present

## 2020-11-01 DIAGNOSIS — N1832 Chronic kidney disease, stage 3b: Secondary | ICD-10-CM | POA: Diagnosis not present

## 2020-11-01 DIAGNOSIS — I130 Hypertensive heart and chronic kidney disease with heart failure and stage 1 through stage 4 chronic kidney disease, or unspecified chronic kidney disease: Secondary | ICD-10-CM | POA: Diagnosis not present

## 2020-11-01 DIAGNOSIS — M5136 Other intervertebral disc degeneration, lumbar region: Secondary | ICD-10-CM | POA: Diagnosis not present

## 2020-11-01 DIAGNOSIS — I509 Heart failure, unspecified: Secondary | ICD-10-CM | POA: Diagnosis not present

## 2020-12-28 DIAGNOSIS — R2689 Other abnormalities of gait and mobility: Secondary | ICD-10-CM | POA: Diagnosis not present

## 2020-12-28 DIAGNOSIS — I130 Hypertensive heart and chronic kidney disease with heart failure and stage 1 through stage 4 chronic kidney disease, or unspecified chronic kidney disease: Secondary | ICD-10-CM | POA: Diagnosis not present

## 2020-12-28 DIAGNOSIS — N1832 Chronic kidney disease, stage 3b: Secondary | ICD-10-CM | POA: Diagnosis not present

## 2021-01-11 ENCOUNTER — Other Ambulatory Visit: Payer: Self-pay | Admitting: Cardiology

## 2021-01-13 NOTE — Progress Notes (Signed)
Cardiology Office Note:    Date:  01/17/2021   ID:  Paige Cook, DOB 10-04-1928, MRN [id number]  PCP:  Burnard Bunting, MD  Cardiologist:  Jaymere Alen Martinique, MD  Electrophysiologist:  None   Referring MD: Burnard Bunting, MD   Chief Complaint  Patient presents with  . Coronary Artery Disease    History of Present Illness:    Paige Cook is a 87 y.o. female with a hx of nonischemic cardiomyopathy, PACs, CAD, hyperlipidemia, and OSA on CPAP.  She is seen for follow up CHF. She is seen with her daughter. Previous cardiac catheterization on 01/10/2010 showed EF 30%, 40 to 50% proximal LAD lesion, 30% mid LAD lesion, otherwise normal left circumflex, and left main and RCA.  Patient had EF of 40% in 2012.  Subsequent echocardiogram in 2013 showed EF of 45%.  He has history of very frequent PACs on the Holter monitor however no atrial fibrillation was identified.    Patient was seen in February 2020 and noted to have mild edema however otherwise asymptomatic.  In August he presented with symptom of PND.  Her Lasix was increased.  Echocardiogram obtained showed decreased LVEF to 20% with moderate MR.  It was noted she was noncompliant with CPAP therapy.  Symptoms did not improve with increased Lasix.  She was seen by Dr. Caryl Comes who felt she was not a candidate for prophylactic ICD due to age and comorbidity.  Titration of medication was limited by CKD and her low BP.  Due to worsening renal function in August, Lasix was reduced to 3 times a week.  She later reported increased edema and shortness of breath.  Lasix was eventually increased back to daily dosing and symptoms improved. Was seen by Almyra Deforest PA-C on 12/16/2019.  She was felt to be volume overloaded and gained about 8 pounds within 1 week.  He increased  her Lasix to 40 mg daily.   On follow up today she is doing well. Seen with her daughter. Now on lasix 20 mg daily with extra 20 mg if needed. Weight is stable down 3 lbs. breathing is doing  well. No edema. Denies any palpitations, dizziness. Notes occasional skipped beats.    Past Medical History:  Diagnosis Date  . Allergic rhinitis   . Anemia   . Arthritis   . Bladder cancer ([place])    giant tumor  . CAD (coronary artery disease)   . Cardiac arrhythmia    benign  . Carotid bruit    left  . CHF (congestive heart failure) (HCC)    CHF with systolic dysfuntion ; dilated nonishemic cardiomyopathy  . Colon cancer ([place]) 1989  . Colon polyps   . Diverticulosis   . Epistaxis   . Female bladder prolapse   . Gallstones   . Heart failure ([place])   . Hypercholesteremia   . Hyponatremia    Probably secondary to her CHF  . OSA (obstructive sleep apnea) 04/29/2012  . SOB (shortness of breath)    Sometimes at night    Past Surgical History:  Procedure Laterality Date  . ABDOMINAL HYSTERECTOMY    . BLADDER SURGERY  2013   cancer removed  . bladder tact    . CARDIAC CATHETERIZATION     non obstruction CAD  . CHOLECYSTECTOMY    . 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 COLONOSCOPY      Current Medications: Current Meds  Medication Sig  . aspirin 81 MG chewable tablet Chew  81 mg by mouth daily with breakfast.  . carvedilol (COREG) 6.25 MG tablet TAKE 1 TABLET BY MOUTH TWICE A DAY WITH MEALS  . Cholecalciferol (VITAMIN D3) 50 MCG (2000 UT) TABS Take 50 mcg by mouth.  . furosemide (LASIX) 40 MG tablet Take 1/2 tablet 20 mg daily  . hydrALAZINE (APRESOLINE) 10 MG tablet Take 1 tablet (10 mg total) by mouth 3 (three) times daily.  . vitamin C (ASCORBIC ACID) 250 MG tablet Take 500 mg by mouth daily with breakfast.      Allergies:   Penicillins and Tramadol   Social History   Socioeconomic History  . Marital status: Married    Spouse name: Not on file  . Number of children: 4  . Years of education: Not on file  . Highest education level: Not on file  Occupational History  . Occupation: retired Paramedic  Tobacco Use  . Smoking status: Never Smoker  .  Smokeless tobacco: Never Used  Substance and Sexual Activity  . Alcohol use: No  . Drug use: No  . Sexual activity: Not Currently  Other Topics Concern  . Not on file  Social History Narrative   Married, 1 son and 3 daughters.    One coffee a day no alcohol no tobacco   05/05/2015   Social Determinants of Health   Financial Resource Strain: Not on file  Food Insecurity: Not on file  Transportation Needs: Not on file  Physical Activity: Not on file  Stress: Not on file  Social Connections: Not on file     Family History: The patient's family history includes Heart attack in her father and mother; Heart disease in her father; Heart failure (age of onset: 4) in her sister. There is no history of Stroke.  ROS:   Please see the history of present illness.     All other systems reviewed and are negative.  EKGs/Labs/Other Studies Reviewed:    The following studies were reviewed today:  Cath 01/10/2010    Echo 07/30/2019 IMPRESSIONS   1. The left ventricle has a visually estimated ejection fraction of 20%. The cavity size was severely dilated. Left ventricular diastolic Doppler parameters are consistent with restrictive filling. Elevated left ventricular end-diastolic pressure Left  ventrical global hypokinesis without regional wall motion abnormalities. 2. The right ventricle has normal systolic function. The cavity was normal. There is no increase in right ventricular wall thickness. 3. Left atrial size was moderately dilated. 4. Right atrial size was mildly dilated. 5. Moderate thickening of the mitral valve leaflet. Mild calcification of the mitral valve leaflet. There is mild mitral annular calcification present. Mitral valve regurgitation is moderate by color flow Doppler. 6. The aortic valve is tricuspid. Moderate thickening of the aortic valve. Sclerosis without any evidence of stenosis of the aortic valve. Aortic valve regurgitation is mild by color flow  Doppler. 7. The aorta is normal unless otherwise noted.  EKG:  EKG is done today. NSR with occ PVC. Nonspecific ST abnormality. I have personally reviewed and interpreted this study.   Recent Labs: 01/28/2020: ALT 25; Magnesium 2.4 09/27/2020: BUN 81; Creatinine, Ser 1.75; Potassium 4.7; Sodium 139  Recent Lipid Panel    Component Value Date/Time   CHOL 164 01/28/2020 0940   TRIG 64 01/28/2020 0940   HDL 60 01/28/2020 0940   CHOLHDL 2.7 01/28/2020 0940   CHOLHDL 2.2 11/03/2015 1042   VLDL 15 11/03/2015 1042   LDLCALC 91 01/28/2020 0940   LDLDIRECT 152.9 03/06/2013 9758  Dated 06/22/19: cholesterol 178, triglycerides 51, HDL 64, LDL 104. LFTs normal. Hgb 11. TSH normal Dated 12/24/19: creatinine 1.89. potassium 5.2.  Dated 06/27/20: BUN 88, creatinine 1.6. cholesterol 177, triglycerides 73, HDL 70, LDL 92. Hgb 10.8. plt 177K. TSH normal.   Physical Exam:    VS:  BP 120/63   Pulse 66   Ht 5' (1.524 m)   Wt 103 lb 9.6 oz (47 kg)   SpO2 98%   BMI 20.23 kg/m     Wt Readings from Last 3 Encounters:  01/17/21 103 lb 9.6 oz (47 kg)  09/27/20 106 lb (48.1 kg)  05/27/20 103 lb 12.8 oz (47.1 kg)     GEN:  Elderly WF  in no acute distress HEENT: Normal NECK: no JVD; No carotid bruits Back: kyphoscoliosis CARDIAC: RRR, no murmurs, rubs, gallops RESPIRATORY:  Clear to auscultation without rales, wheezing or rhonchi  ABDOMEN: Soft, non-tender, non-distended MUSCULOSKELETAL:  No ankle edema; marked scoliosis SKIN: Warm and dry NEUROLOGIC:  Alert and oriented x 3 PSYCHIATRIC:  Normal affect   ASSESSMENT:    1. Chronic systolic HF (heart failure) (HCC)   2. Stage 3b chronic kidney disease ([place])   3. NICM (nonischemic cardiomyopathy) ([place])   4. Coronary artery disease involving native coronary artery of native heart without angina pectoris   5. Essential hypertension    PLAN:    In order of problems listed above:  1. Chronic systolic heart failure: she is doing well on  lasix 20 mg daily.   On Imdur 30 mg daily and hydralazine 5 mg tid. Not a candidate for ACEi/ARV,ARNI, or spironolactone due to CKD. Continue Sodium restriction. Follow up in 6 months.  2. Nonischemic cardiomyopathy: Previous echocardiogram in March 2020 showed worsening EF of 20%.  Poor long-term prognosis.  3. CAD: No chest pain  4. Hyperlipidemia: LDL 91.   5.   CKD stage 3b.    Medication Adjustments/Labs and Tests Ordered: Current medicines are reviewed at length with the patient today.  Concerns regarding medicines are outlined above.  No orders of the defined types were placed in this encounter.  No orders of the defined types were placed in this encounter.   There are no Patient Instructions on file for this visit.   Signed, Ayde Record Martinique, MD  01/17/2021 2:24 PM    [facility]

## 2021-01-17 ENCOUNTER — Encounter: Payer: Self-pay | Admitting: Cardiology

## 2021-01-17 ENCOUNTER — Other Ambulatory Visit: Payer: Self-pay

## 2021-01-17 ENCOUNTER — Ambulatory Visit (INDEPENDENT_AMBULATORY_CARE_PROVIDER_SITE_OTHER): Payer: Medicare Other | Admitting: Cardiology

## 2021-01-17 VITALS — BP 120/63 | HR 66 | Ht 60.0 in | Wt 103.6 lb

## 2021-01-17 DIAGNOSIS — I2510 Atherosclerotic heart disease of native coronary artery without angina pectoris: Secondary | ICD-10-CM

## 2021-01-17 DIAGNOSIS — I10 Essential (primary) hypertension: Secondary | ICD-10-CM

## 2021-01-17 DIAGNOSIS — N1832 Chronic kidney disease, stage 3b: Secondary | ICD-10-CM | POA: Diagnosis not present

## 2021-01-17 DIAGNOSIS — I5022 Chronic systolic (congestive) heart failure: Secondary | ICD-10-CM

## 2021-01-17 DIAGNOSIS — I428 Other cardiomyopathies: Secondary | ICD-10-CM | POA: Diagnosis not present

## 2021-01-28 ENCOUNTER — Other Ambulatory Visit: Payer: Self-pay | Admitting: Cardiology

## 2021-04-06 DIAGNOSIS — N184 Chronic kidney disease, stage 4 (severe): Secondary | ICD-10-CM | POA: Diagnosis not present

## 2021-04-06 DIAGNOSIS — H903 Sensorineural hearing loss, bilateral: Secondary | ICD-10-CM | POA: Diagnosis not present

## 2021-04-06 DIAGNOSIS — Z822 Family history of deafness and hearing loss: Secondary | ICD-10-CM | POA: Diagnosis not present

## 2021-05-05 ENCOUNTER — Other Ambulatory Visit: Payer: Self-pay | Admitting: Cardiology

## 2021-05-22 ENCOUNTER — Inpatient Hospital Stay (HOSPITAL_BASED_OUTPATIENT_CLINIC_OR_DEPARTMENT_OTHER)
Admission: EM | Admit: 2021-05-22 | Discharge: 2021-05-25 | DRG: 372 | Disposition: A | Discharge status: Home / Self Care | Payer: Medicare Other | Attending: Internal Medicine | Admitting: Internal Medicine

## 2021-05-22 ENCOUNTER — Other Ambulatory Visit: Payer: Self-pay

## 2021-05-22 ENCOUNTER — Encounter (HOSPITAL_BASED_OUTPATIENT_CLINIC_OR_DEPARTMENT_OTHER): Payer: Self-pay | Admitting: Emergency Medicine

## 2021-05-22 DIAGNOSIS — R197 Diarrhea, unspecified: Secondary | ICD-10-CM

## 2021-05-22 DIAGNOSIS — Z20822 Contact with and (suspected) exposure to covid-19: Secondary | ICD-10-CM | POA: Diagnosis present

## 2021-05-22 DIAGNOSIS — E872 Acidosis: Secondary | ICD-10-CM | POA: Diagnosis present

## 2021-05-22 DIAGNOSIS — I2510 Atherosclerotic heart disease of native coronary artery without angina pectoris: Secondary | ICD-10-CM | POA: Diagnosis present

## 2021-05-22 DIAGNOSIS — N179 Acute kidney failure, unspecified: Secondary | ICD-10-CM

## 2021-05-22 DIAGNOSIS — A0472 Enterocolitis due to Clostridium difficile, not specified as recurrent: Secondary | ICD-10-CM | POA: Diagnosis not present

## 2021-05-22 DIAGNOSIS — E876 Hypokalemia: Secondary | ICD-10-CM | POA: Diagnosis present

## 2021-05-22 DIAGNOSIS — N1832 Chronic kidney disease, stage 3b: Secondary | ICD-10-CM | POA: Diagnosis present

## 2021-05-22 DIAGNOSIS — E7800 Pure hypercholesterolemia, unspecified: Secondary | ICD-10-CM | POA: Diagnosis present

## 2021-05-22 DIAGNOSIS — Z8249 Family history of ischemic heart disease and other diseases of the circulatory system: Secondary | ICD-10-CM | POA: Diagnosis not present

## 2021-05-22 DIAGNOSIS — I428 Other cardiomyopathies: Secondary | ICD-10-CM | POA: Diagnosis present

## 2021-05-22 DIAGNOSIS — E860 Dehydration: Secondary | ICD-10-CM | POA: Diagnosis present

## 2021-05-22 DIAGNOSIS — G4733 Obstructive sleep apnea (adult) (pediatric): Secondary | ICD-10-CM | POA: Diagnosis present

## 2021-05-22 DIAGNOSIS — Z90710 Acquired absence of both cervix and uterus: Secondary | ICD-10-CM

## 2021-05-22 DIAGNOSIS — Z79899 Other long term (current) drug therapy: Secondary | ICD-10-CM

## 2021-05-22 DIAGNOSIS — Z7982 Long term (current) use of aspirin: Secondary | ICD-10-CM

## 2021-05-22 DIAGNOSIS — I10 Essential (primary) hypertension: Secondary | ICD-10-CM | POA: Diagnosis present

## 2021-05-22 DIAGNOSIS — Z8551 Personal history of malignant neoplasm of bladder: Secondary | ICD-10-CM | POA: Diagnosis not present

## 2021-05-22 DIAGNOSIS — I5022 Chronic systolic (congestive) heart failure: Secondary | ICD-10-CM

## 2021-05-22 DIAGNOSIS — Z85038 Personal history of other malignant neoplasm of large intestine: Secondary | ICD-10-CM | POA: Diagnosis not present

## 2021-05-22 DIAGNOSIS — Z9049 Acquired absence of other specified parts of digestive tract: Secondary | ICD-10-CM | POA: Diagnosis not present

## 2021-05-22 DIAGNOSIS — I130 Hypertensive heart and chronic kidney disease with heart failure and stage 1 through stage 4 chronic kidney disease, or unspecified chronic kidney disease: Secondary | ICD-10-CM | POA: Diagnosis present

## 2021-05-22 DIAGNOSIS — N189 Chronic kidney disease, unspecified: Secondary | ICD-10-CM

## 2021-05-22 HISTORY — DX: Acute kidney failure, unspecified: N17.9

## 2021-05-22 LAB — CBC WITH DIFFERENTIAL/PLATELET
Abs Immature Granulocytes: 0.01 10*3/uL (ref 0.00–0.07)
Basophils Absolute: 0 10*3/uL (ref 0.0–0.1)
Basophils Relative: 1 %
Eosinophils Absolute: 0.2 10*3/uL (ref 0.0–0.5)
Eosinophils Relative: 4 %
HCT: 28.6 % — ABNORMAL LOW (ref 36.0–46.0)
Hemoglobin: 9.8 g/dL — ABNORMAL LOW (ref 12.0–15.0)
Immature Granulocytes: 0 %
Lymphocytes Relative: 17 %
Lymphs Abs: 0.9 10*3/uL (ref 0.7–4.0)
MCH: 33 pg (ref 26.0–34.0)
MCHC: 34.3 g/dL (ref 30.0–36.0)
MCV: 96.3 fL (ref 80.0–100.0)
Monocytes Absolute: 0.5 10*3/uL (ref 0.1–1.0)
Monocytes Relative: 10 %
Neutro Abs: 3.7 10*3/uL (ref 1.7–7.7)
Neutrophils Relative %: 68 %
Platelets: 188 10*3/uL (ref 150–400)
RBC: 2.97 MIL/uL — ABNORMAL LOW (ref 3.87–5.11)
RDW: 13.5 % (ref 11.5–15.5)
WBC: 5.4 10*3/uL (ref 4.0–10.5)
nRBC: 0 % (ref 0.0–0.2)

## 2021-05-22 LAB — COMPREHENSIVE METABOLIC PANEL
ALT: 9 U/L (ref 0–44)
AST: 17 U/L (ref 15–41)
Albumin: 4 g/dL (ref 3.5–5.0)
Alkaline Phosphatase: 62 U/L (ref 38–126)
Anion gap: 17 — ABNORMAL HIGH (ref 5–15)
BUN: 110 mg/dL — ABNORMAL HIGH (ref 8–23)
CO2: 15 mmol/L — ABNORMAL LOW (ref 22–32)
Calcium: 9.2 mg/dL (ref 8.9–10.3)
Chloride: 108 mmol/L (ref 98–111)
Creatinine, Ser: 2.49 mg/dL — ABNORMAL HIGH (ref 0.44–1.00)
GFR, Estimated: 18 mL/min — ABNORMAL LOW (ref >60)
Glucose, Bld: 122 mg/dL — ABNORMAL HIGH (ref 70–99)
Potassium: 3.2 mmol/L — ABNORMAL LOW (ref 3.5–5.1)
Sodium: 140 mmol/L (ref 135–145)
Total Bilirubin: 0.3 mg/dL (ref 0.3–1.2)
Total Protein: 6.7 g/dL (ref 6.5–8.1)

## 2021-05-22 LAB — RESP PANEL BY RT-PCR (FLU A&B, COVID) ARPGX2
Influenza A by PCR: NEGATIVE
Influenza B by PCR: NEGATIVE
SARS Coronavirus 2 by RT PCR: NEGATIVE

## 2021-05-22 MED ORDER — LACTATED RINGERS IV BOLUS: 1000.0000 mL | Freq: Once | INTRAVENOUS | Status: DC

## 2021-05-22 MED ORDER — LACTATED RINGERS IV SOLN: INTRAVENOUS | Status: DC

## 2021-05-22 MED ORDER — HEPARIN SODIUM (PORCINE) 5000 UNIT/ML IJ SOLN
5000.0000 [IU] | Freq: Three times a day (TID) | INTRAMUSCULAR | Status: DC
  Administered 2021-05-22 – 2021-05-25 (×8): 5000 [IU] via SUBCUTANEOUS
  Filled 2021-05-22 (×8): qty 1

## 2021-05-22 MED ORDER — POTASSIUM CHLORIDE CRYS ER 20 MEQ PO TBCR
40.0000 meq | EXTENDED_RELEASE_TABLET | Freq: Once | ORAL | Status: AC
  Administered 2021-05-22: 40 meq via ORAL
  Filled 2021-05-22: qty 2

## 2021-05-22 MED ORDER — LOPERAMIDE HCL 2 MG PO CAPS: 2.0000 mg | ORAL_CAPSULE | ORAL | Status: DC | PRN

## 2021-05-22 MED ORDER — LACTATED RINGERS IV BOLUS
1000.0000 mL | Freq: Once | INTRAVENOUS | Status: AC
  Administered 2021-05-22: 1000 mL via INTRAVENOUS

## 2021-05-22 MED ORDER — LACTATED RINGERS IV BOLUS: 500.0000 mL | Freq: Once | INTRAVENOUS | Status: DC

## 2021-05-22 NOTE — H&P (Signed)
History and Physical    Paige Cook:[id number] DOB: 1928/01/16 DOA: 05/22/2021  PCP: Burnard Bunting, MD  Patient coming from: [facility]  I have personally briefly reviewed patient's old medical records in [place]  Chief Complaint: diarrhea  HPI: Paige Cook is a 87 y.o. female with medical history significant for chronic systolic heart failure (EF 20% on 07/2019), CKD stage 3b, OSA on CPAP, CAD, history of remote colon and bladder cancer who presents with concerns of persistent diarrhea.  Patient reports that for at least greater than a week she has been having persistent diarrhea.  Denies any associated abdominal pain.  No nausea or vomiting.  Continues to have a good appetite.  Denies any recent travel or sick contact.  No new medications or antibiotics.  No fever.  Diarrhea is nonbloody.  ED Course: She was afebrile and normotensive.  K of 3.2.  Creatinine significantly elevated 2.49 from baseline of around 1.8.  Gap was 17.  Review of Systems: Constitutional: No Weight Change, No Fever ENT/Mouth: No sore throat, No Rhinorrhea Eyes: No Eye Pain, No Vision Changes Cardiovascular: No Chest Pain, no SOB Respiratory: No Cough, No Sputum Gastrointestinal: No Nausea, No Vomiting, +Diarrhea, No Constipation, No Pain Genitourinary: no Urinary Incontinence Musculoskeletal: No Arthralgias, No Myalgias Skin: No Skin Lesions, No Pruritus, Neuro: + Weakness, No Numbness Psych: No Anxiety/Panic, No Depression, no decrease appetite Heme/Lymph: No Bruising, No Bleeding  Past Medical History:  Diagnosis Date  . Allergic rhinitis   . Anemia   . Arthritis   . Bladder cancer ([place])    giant tumor  . CAD (coronary artery disease)   . Cardiac arrhythmia    benign  . Carotid bruit    left  . CHF (congestive heart failure) (HCC)    CHF with systolic dysfuntion ; dilated nonishemic cardiomyopathy  . Colon cancer ([place]) 1989  . Colon polyps   . Diverticulosis   .  Epistaxis   . Female bladder prolapse   . Gallstones   . Heart failure ([place])   . Hypercholesteremia   . Hyponatremia    Probably secondary to her CHF  . OSA (obstructive sleep apnea) 04/29/2012  . SOB (shortness of breath)    Sometimes at night    Past Surgical History:  Procedure Laterality Date  . ABDOMINAL HYSTERECTOMY    . BLADDER SURGERY  2013   cancer removed  . bladder tact    . CARDIAC CATHETERIZATION     non obstruction CAD  . CHOLECYSTECTOMY    . 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 COLONOSCOPY       reports that she has never smoked. She has never used smokeless tobacco. She reports that she does not drink alcohol and does not use drugs. Social History  Allergies  Allergen Reactions  . Penicillins Hives    Has patient had a PCN reaction causing immediate rash, facial/tongue/throat swelling, SOB or lightheadedness with hypotension: Yes Has patient had a PCN reaction causing severe rash involving mucus membranes or skin necrosis: Yes Has patient had a PCN reaction that required hospitalization No Has patient had a PCN reaction occurring within the last 10 years: No If all of the above answers are "NO", then may proceed with Cephalosporin use.   . Tramadol Nausea Only and Other (See Comments)    shakes    Family History  Problem Relation Age of Onset  . Heart disease Father   . Heart attack Father   .  Heart attack Mother   . Heart failure Sister 35  . Stroke Neg Hx      Prior to Admission medications   Medication Sig Start Date End Date Taking? Authorizing Provider  aspirin 81 MG chewable tablet Chew 81 mg by mouth daily with breakfast.   Yes [provider]  carvedilol (COREG) 6.25 MG tablet TAKE 1 TABLET BY MOUTH TWICE A DAY WITH MEALS 01/11/21  Yes Martinique, Peter M, MD  Cholecalciferol (VITAMIN D3) 50 MCG (2000 UT) TABS Take 50 mcg by mouth.   Yes [provider]  furosemide (LASIX) 40 MG tablet Take 1/2 tablet 20 mg daily 09/27/20   Yes Martinique, Peter M, MD  hydrALAZINE (APRESOLINE) 10 MG tablet TAKE 1 TABLET BY MOUTH THREE TIMES A DAY 05/08/21  Yes Martinique, Peter M, MD  isosorbide mononitrate (IMDUR) 30 MG 24 hr tablet TAKE 1 TABLET BY MOUTH EVERY DAY 01/30/21  Yes Martinique, Peter M, MD  vitamin C (ASCORBIC ACID) 250 MG tablet Take 500 mg by mouth daily with breakfast.    Yes [provider]    Physical Exam: Vitals:   05/22/21 1315 05/22/21 1445 05/22/21 1846 05/22/21 2103  BP: (!) 131/51 (!) 147/50 (!) 130/49 (!) 152/56  Pulse: 69 84 74 74  Resp: (!) 22 (!) 27 (!) 22 20  Temp:    98.3 F (36.8 C)  TempSrc:    Oral  SpO2: 99% 100% 98% 100%  Weight:      Height:        Constitutional: NAD, calm, comfortable, nontoxic thin elderly female appearing younger than stated age laying flat in bed Vitals:   05/22/21 1315 05/22/21 1445 05/22/21 1846 05/22/21 2103  BP: (!) 131/51 (!) 147/50 (!) 130/49 (!) 152/56  Pulse: 69 84 74 74  Resp: (!) 22 (!) 27 (!) 22 20  Temp:    98.3 F (36.8 C)  TempSrc:    Oral  SpO2: 99% 100% 98% 100%  Weight:      Height:       Eyes: PERRL, lids and conjunctivae normal ENMT: Mucous membranes are moist.  Neck: normal, supple Respiratory: clear to auscultation bilaterally, no wheezing, no crackles. Normal respiratory effort. No accessory muscle use.  Cardiovascular: Regular rate and rhythm, no murmurs / rubs / gallops. No extremity edema.  Abdomen: no tenderness, no masses palpated.  Bowel sounds positive.  Musculoskeletal: no clubbing / cyanosis. No joint deformity upper and lower extremities.   Skin: no rashes, lesions, ulcers. No induration Neurologic: CN 2-12 grossly intact. Sensation intact, Strength 5/5 in all 4.  Psychiatric: Normal judgment and insight. Alert and oriented x 3. Normal mood.     Labs on Admission: I have personally reviewed following labs and imaging studies  CBC: Recent Labs  Lab 05/22/21 1350  WBC 5.4  NEUTROABS 3.7  HGB 9.8*  HCT 28.6*  MCV  96.3  PLT 891   Basic Metabolic Panel: Recent Labs  Lab 05/22/21 1350  NA 140  K 3.2*  CL 108  CO2 15*  GLUCOSE 122*  BUN 110*  CREATININE 2.49*  CALCIUM 9.2   GFR: Estimated Creatinine Clearance: 10.8 mL/min (A) (by C-G formula based on SCr of 2.49 mg/dL (H)). Liver Function Tests: Recent Labs  Lab 05/22/21 1350  AST 17  ALT 9  ALKPHOS 62  BILITOT 0.3  PROT 6.7  ALBUMIN 4.0   No results for input(s): LIPASE, AMYLASE in the last 168 hours. No results for input(s): AMMONIA in the last  168 hours. Coagulation Profile: No results for input(s): INR, PROTIME in the last 168 hours. Cardiac Enzymes: No results for input(s): CKTOTAL, CKMB, CKMBINDEX, TROPONINI in the last 168 hours. BNP (last 3 results) No results for input(s): PROBNP in the last 8760 hours. HbA1C: No results for input(s): HGBA1C in the last 72 hours. CBG: No results for input(s): GLUCAP in the last 168 hours. Lipid Profile: No results for input(s): CHOL, HDL, LDLCALC, TRIG, CHOLHDL, LDLDIRECT in the last 72 hours. Thyroid Function Tests: No results for input(s): TSH, T4TOTAL, FREET4, T3FREE, THYROIDAB in the last 72 hours. Anemia Panel: No results for input(s): VITAMINB12, FOLATE, FERRITIN, TIBC, IRON, RETICCTPCT in the last 72 hours. Urine analysis:    Component Value Date/Time   COLORURINE YELLOW 03/12/2017 0831   APPEARANCEUR CLEAR 03/12/2017 0831   LABSPEC 1.016 03/12/2017 0831   PHURINE 6.0 03/12/2017 0831   GLUCOSEU NEGATIVE 03/12/2017 0831   HGBUR NEGATIVE 03/12/2017 0831   BILIRUBINUR NEGATIVE 03/12/2017 0831   KETONESUR 5 (A) 03/12/2017 0831   PROTEINUR NEGATIVE 03/12/2017 0831   UROBILINOGEN 0.2 12/06/2009 0207   NITRITE NEGATIVE 03/12/2017 0831   LEUKOCYTESUR TRACE (A) 03/12/2017 0831    Radiological Exams on Admission: No results found.    Assessment/Plan  Diarrhea Unclear etiology but likely viral gastroenteritis. Abdominal exam benign GI panel pending  Acute on chronic  kidney disease stage 3b from dehydration -continues IV LR at 50cc/hr due to low EF - Check FENA lab   Hypokalemia -repleted with K supplement   Chronic systolic HF stable. EF 20% on 07/2019 Hold Lasix for now while volume depleted  HTN  -Continue hydralazine, Coreg, isosorbide mononitrate  OSA Continue CPAP   DVT prophylaxis:.Lovenox Code Status: Full Family Communication: Plan discussed with patient at bedside  disposition Plan: Home with at least 2 midnight stays  Consults called:  Admission status: inpatient  Level of care: Med-Surg  Status is: Inpatient  Remains inpatient appropriate because:Inpatient level of care appropriate due to severity of illness   Dispo: The patient is from: Home              Anticipated d/c is to: Home              Patient currently is not medically stable to d/c.   Difficult to place patient No         Orene Desanctis DO [facility]   If 7PM-7AM, please contact night-coverage [email or web address]   05/22/2021, 9:58 PM

## 2021-05-22 NOTE — ED Notes (Signed)
Patient aware stool sample needed, will attempt to collect when patient has urge to go

## 2021-05-22 NOTE — ED Triage Notes (Signed)
Diarrhea for two weeks, taking immodium at home with some relief - states only four doses. Concerned about how long it's been ongoing/dehydration.

## 2021-05-22 NOTE — ED Notes (Signed)
Attempted IV and blood draw x2 without success.

## 2021-05-22 NOTE — ED Provider Notes (Addendum)
[facility] EMERGENCY DEPT Provider Note   CSN: [id number] Arrival date & time: 05/22/21  1254  History Chief Complaint  Patient presents with  . Diarrhea    Paige Cook is a 87 y.o. female with history listed below presents for diarrhea for the past 2 weeks. Patient's daughter also present in the room. Notes diarrhea started 2 weeks ago after going to a picnic and had some shrimp. Rest of family also ate similar food and did not have any symptoms. Describes BM as 6-8 liquid episodes of day. When this first started noted stool was dark in color. Now normal in color but has still been having 6 episodes of diarrhea most days until Thursday. On Friday, family contact clinic about persistent diarrhea and was advised to start imodium. Has take 4 doses since Friday with improvement in diarrhea, has only had 2 episodes yesterday and once this morning. Otherwise notes she is feeling overall weak since all of this started. She has been eating and drinking well and able to get up and do her normally daily activities. Daughter has been trying to get her to drink more to stay hydrated. She denies fever, chills, shortness of breath, abdominal pain, nausea, vomiting, dizziness, hematochezia, dysuria, dizziness, or syncope. Denies recent antibiotic use. Daughter thinks last time was around a year ago for UTI.   Past Medical History:  Diagnosis Date  . Allergic rhinitis   . Anemia   . Arthritis   . Bladder cancer ([place])    giant tumor  . CAD (coronary artery disease)   . Cardiac arrhythmia    benign  . Carotid bruit    left  . CHF (congestive heart failure) (HCC)    CHF with systolic dysfuntion ; dilated nonishemic cardiomyopathy  . Colon cancer ([place]) 1989  . Colon polyps   . Diverticulosis   . Epistaxis   . Female bladder prolapse   . Gallstones   . Heart failure ([place])   . Hypercholesteremia   . Hyponatremia    Probably secondary to her CHF  . OSA (obstructive sleep apnea)  04/29/2012  . SOB (shortness of breath)    Sometimes at night    Patient Active Problem List   Diagnosis Date Noted  . Acute on chronic systolic (congestive) heart failure ([place]) 08/14/2019  . PAT (paroxysmal atrial tachycardia) ([place]) 08/14/2019  . NICM (nonischemic cardiomyopathy) ([place]) 07/23/2019  . Orthopnea 07/23/2019  . SBO (small bowel obstruction) ([place]) 03/12/2017  . CKD (chronic kidney disease), stage III ([place]) 03/12/2017  . HTN (hypertension) 03/12/2017  . PAC (premature atrial contraction) 08/14/2012  . PVC's (premature ventricular contractions) 08/14/2012  . Heart palpitations 07/25/2012  . OSA (obstructive sleep apnea) 04/29/2012  . Carotid bruit   . Hypercholesteremia   . CAD (coronary artery disease)     Past Surgical History:  Procedure Laterality Date  . ABDOMINAL HYSTERECTOMY    . BLADDER SURGERY  2013   cancer removed  . bladder tact    . CARDIAC CATHETERIZATION     non obstruction CAD  . CHOLECYSTECTOMY    . 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 COLONOSCOPY       OB History   No obstetric history on file.     Family History  Problem Relation Age of Onset  . Heart disease Father   . Heart attack Father   . Heart attack Mother   . Heart failure Sister 18  . Stroke Neg Hx  Social History   Tobacco Use  . Smoking status: Never Smoker  . Smokeless tobacco: Never Used  Substance Use Topics  . Alcohol use: No  . Drug use: No    Home Medications Prior to Admission medications   Medication Sig Start Date End Date Taking? Authorizing Provider  aspirin 81 MG chewable tablet Chew 81 mg by mouth daily with breakfast.   Yes [provider]  carvedilol (COREG) 6.25 MG tablet TAKE 1 TABLET BY MOUTH TWICE A DAY WITH MEALS 01/11/21  Yes [place], Peter M, MD  Cholecalciferol (VITAMIN D3) 50 MCG (2000 UT) TABS Take 50 mcg by mouth.   Yes [provider]  furosemide (LASIX) 40 MG tablet Take 1/2 tablet 20 mg daily 09/27/20  Yes [place],  Peter M, MD  hydrALAZINE (APRESOLINE) 10 MG tablet TAKE 1 TABLET BY MOUTH THREE TIMES A DAY 05/08/21  Yes Martinique, Peter M, MD  isosorbide mononitrate (IMDUR) 30 MG 24 hr tablet TAKE 1 TABLET BY MOUTH EVERY DAY 01/30/21  Yes [place], Peter M, MD  vitamin C (ASCORBIC ACID) 250 MG tablet Take 500 mg by mouth daily with breakfast.    Yes [provider]    Allergies    Penicillins and Tramadol  Review of Systems   Review of Systems  Constitutional: Positive for activity change. Negative for chills and fever.  HENT: Negative for congestion and sore throat.   Eyes: Negative for pain and discharge.  Respiratory: Negative for shortness of breath and wheezing.   Cardiovascular: Negative for chest pain and palpitations.  Gastrointestinal: Positive for diarrhea. Negative for abdominal distention, abdominal pain, constipation, nausea and vomiting.  Endocrine: Negative for polydipsia and polyphagia.  Genitourinary: Negative for decreased urine volume, dysuria and frequency.  Musculoskeletal: Negative for back pain and joint swelling.  Skin: Negative for pallor and wound.  Neurological: Negative for dizziness, syncope and light-headedness.  Psychiatric/Behavioral: Negative for agitation and behavioral problems.    Physical Exam Updated Vital Signs BP (!) 147/50   Pulse 84   Temp 98.9 F (37.2 C) (Oral)   Resp (!) 27   Ht 5\' 1"  (1.549 m)   Wt 46.7 kg   SpO2 100%   BMI 19.46 kg/m   Physical Exam Constitutional:      General: She is not in acute distress.    Appearance: Normal appearance.  HENT:     Head: Normocephalic and atraumatic.     Nose: No congestion or rhinorrhea.     Mouth/Throat:     Mouth: Mucous membranes are dry.     Pharynx: Oropharynx is clear.  Eyes:     Extraocular Movements: Extraocular movements intact.     Pupils: Pupils are equal, round, and reactive to light.  Cardiovascular:     Rate and Rhythm: Normal rate and regular rhythm.     Pulses: Normal  pulses.  Pulmonary:     Effort: Pulmonary effort is normal.     Breath sounds: Normal breath sounds. No wheezing or rales.  Abdominal:     General: Abdomen is flat. Bowel sounds are normal. There is no distension.     Palpations: Abdomen is soft. There is no mass.     Tenderness: There is no abdominal tenderness. There is no guarding.  Musculoskeletal:     Cervical back: Normal range of motion and neck supple.     Right lower leg: No edema.     Left lower leg: No edema.  Skin:    General: Skin is warm  and dry.     Capillary Refill: Capillary refill takes less than 2 seconds.  Neurological:     General: No focal deficit present.     Mental Status: She is alert.  Psychiatric:        Mood and Affect: Mood normal.        Behavior: Behavior normal.     ED Results / Procedures / Treatments   Labs (all labs ordered are listed, but only abnormal results are displayed) Labs Reviewed  COMPREHENSIVE METABOLIC PANEL - Abnormal; Notable for the following components:      Result Value   Potassium 3.2 (*)    CO2 15 (*)    Glucose, Bld 122 (*)    BUN 110 (*)    Creatinine, Ser 2.49 (*)    GFR, Estimated 18 (*)    Anion gap 17 (*)    All other components within normal limits  CBC WITH DIFFERENTIAL/PLATELET - Abnormal; Notable for the following components:   RBC 2.97 (*)    Hemoglobin 9.8 (*)    HCT 28.6 (*)    All other components within normal limits  GASTROINTESTINAL PANEL BY PCR, STOOL (REPLACES STOOL CULTURE)  C DIFFICILE QUICK SCREEN W PCR REFLEX   EKG None  Radiology No results found.  Medications Ordered in ED Medications  potassium chloride SA (KLOR-CON) CR tablet 40 mEq (has no administration in time range)  lactated ringers bolus 1,000 mL (1,000 mLs Intravenous New Bag/Given 05/22/21 1359)    ED Course  I have reviewed the triage vital signs and the nursing notes.  Pertinent labs & imaging results that were available during my care of the patient were reviewed by  me and considered in my medical decision making (see chart for details).    MDM Rules/Calculators/A&P Patient presenting with 2 weeks of diarrhea and weakness. Afebrile, BP briefly in 80s over 50s but now hypertensive to 140s.  Appear dry on exam. No abdominal pain on exam or nausea and vomiting. Will hold off on imaging at this time. Will order GI panel and C. Diff given length of symptoms. CMP significant for BUN 110 and sCr of 2.49 (baseline appears around 1.8). No leukocytosis, hgb of 9.8 appears close to her baseline, although elevated BUN is concerning for possible bleed. Denies any symptoms of bleeding. Likely AKI likely due to dehydration in setting of acute diarrhea. Started on fluids, will need close monitoring given history of CHF. Has not been able to provide a stool sample, but suspect etiology of diarrhea may be infectious. WIll discuss with hospitalist for rehydration and renal monitoring.   Final Clinical Impression(s) / ED Diagnoses Final diagnoses:  None    Rx / DC Orders ED Discharge Orders    None       Iona Beard, MD 05/22/21 1521    Iona Beard, MD 05/22/21 [place]    Iona Beard, MD 05/22/21 1531    Tegeler, Gwenyth Allegra, MD 05/22/21 1539

## 2021-05-22 NOTE — ED Notes (Signed)
Patient placed on purwick

## 2021-05-23 ENCOUNTER — Other Ambulatory Visit: Payer: Self-pay | Admitting: Infectious Diseases

## 2021-05-23 DIAGNOSIS — R197 Diarrhea, unspecified: Secondary | ICD-10-CM

## 2021-05-23 DIAGNOSIS — A0472 Enterocolitis due to Clostridium difficile, not specified as recurrent: Principal | ICD-10-CM

## 2021-05-23 DIAGNOSIS — I2510 Atherosclerotic heart disease of native coronary artery without angina pectoris: Secondary | ICD-10-CM

## 2021-05-23 LAB — GASTROINTESTINAL PANEL BY PCR, STOOL (REPLACES STOOL CULTURE)

## 2021-05-23 LAB — C DIFFICILE QUICK SCREEN W PCR REFLEX
C Diff antigen: POSITIVE — AB
C Diff toxin: NEGATIVE

## 2021-05-23 LAB — BASIC METABOLIC PANEL
Anion gap: 10 (ref 5–15)
BUN: 99 mg/dL — ABNORMAL HIGH (ref 8–23)
CO2: 15 mmol/L — ABNORMAL LOW (ref 22–32)
Calcium: 8.5 mg/dL — ABNORMAL LOW (ref 8.9–10.3)
Chloride: 116 mmol/L — ABNORMAL HIGH (ref 98–111)
Creatinine, Ser: 1.95 mg/dL — ABNORMAL HIGH (ref 0.44–1.00)
GFR, Estimated: 24 mL/min — ABNORMAL LOW (ref >60)
Glucose, Bld: 118 mg/dL — ABNORMAL HIGH (ref 70–99)
Potassium: 3.7 mmol/L (ref 3.5–5.1)
Sodium: 141 mmol/L (ref 135–145)

## 2021-05-23 LAB — CLOSTRIDIUM DIFFICILE BY PCR, REFLEXED: Toxigenic C. Difficile by PCR: POSITIVE — AB

## 2021-05-23 MED ORDER — SODIUM BICARBONATE 650 MG PO TABS
650.0000 mg | ORAL_TABLET | Freq: Three times a day (TID) | ORAL | Status: DC
  Administered 2021-05-23 – 2021-05-25 (×6): 650 mg via ORAL
  Filled 2021-05-23 (×7): qty 1

## 2021-05-23 MED ORDER — CARVEDILOL 6.25 MG PO TABS
6.2500 mg | ORAL_TABLET | Freq: Two times a day (BID) | ORAL | Status: DC
  Administered 2021-05-23 – 2021-05-25 (×5): 6.25 mg via ORAL
  Filled 2021-05-23 (×5): qty 1

## 2021-05-23 MED ORDER — VITAMIN D 25 MCG (1000 UNIT) PO TABS
2000.0000 [IU] | ORAL_TABLET | Freq: Every day | ORAL | Status: DC
  Administered 2021-05-23 – 2021-05-25 (×3): 2000 [IU] via ORAL
  Filled 2021-05-23 (×3): qty 2

## 2021-05-23 MED ORDER — ISOSORBIDE MONONITRATE ER 30 MG PO TB24
30.0000 mg | ORAL_TABLET | Freq: Every day | ORAL | Status: DC
  Administered 2021-05-23 – 2021-05-25 (×3): 30 mg via ORAL
  Filled 2021-05-23 (×3): qty 1

## 2021-05-23 MED ORDER — VANCOMYCIN HCL 125 MG PO CAPS
125.0000 mg | ORAL_CAPSULE | Freq: Four times a day (QID) | ORAL | Status: DC
  Administered 2021-05-23 – 2021-05-25 (×9): 125 mg via ORAL
  Filled 2021-05-23 (×10): qty 1

## 2021-05-23 MED ORDER — DEXTROSE-NACL 5-0.45 % IV SOLN
INTRAVENOUS | Status: DC
  Filled 2021-05-23 (×2): qty 1000

## 2021-05-23 MED ORDER — ASPIRIN 81 MG PO CHEW
81.0000 mg | CHEWABLE_TABLET | Freq: Every day | ORAL | Status: DC
  Administered 2021-05-23 – 2021-05-25 (×3): 81 mg via ORAL
  Filled 2021-05-23 (×3): qty 1

## 2021-05-23 MED ORDER — HYDRALAZINE HCL 10 MG PO TABS
10.0000 mg | ORAL_TABLET | Freq: Three times a day (TID) | ORAL | Status: DC
  Administered 2021-05-23 – 2021-05-24 (×4): 10 mg via ORAL
  Filled 2021-05-23 (×8): qty 1

## 2021-05-23 MED ORDER — CYANOCOBALAMIN 500 MCG PO TABS
250.0000 ug | ORAL_TABLET | Freq: Every day | ORAL | Status: DC
  Administered 2021-05-23 – 2021-05-25 (×3): 250 ug via ORAL
  Filled 2021-05-23 (×3): qty 1

## 2021-05-23 MED ORDER — ASCORBIC ACID 500 MG PO TABS
500.0000 mg | ORAL_TABLET | Freq: Every day | ORAL | Status: DC
  Administered 2021-05-23 – 2021-05-25 (×3): 500 mg via ORAL
  Filled 2021-05-23 (×3): qty 1

## 2021-05-23 NOTE — Plan of Care (Signed)
Plan of care initiated and discussed with the patient. 

## 2021-05-23 NOTE — Progress Notes (Signed)
Patient refused CPAP for the night  

## 2021-05-23 NOTE — Progress Notes (Signed)
Spoke with the patient's daughter this morning re: visitation policy and staying overnight.  I had allowed her to stay overnight after conversation centered around her fear of her mother falling if we were unable to get to her quickly.  Tried multiple times to reassure her that we were capable of caring for her mom and keeping her safe, yet she persisted.  I finally conceded, and told her clearly that she would NOT be allowed to stay a subsequent night.  This morning she asked again.  Again, I repeated to her that our policy was that no one is allowed to stay overnight except in extenuating circumstances.

## 2021-05-23 NOTE — Progress Notes (Addendum)
PROGRESS NOTE    Paige Cook  SFK:[id number] DOB: 06-Aug-1928 DOA: 05/22/2021 PCP: Burnard Bunting, MD    Brief Narrative:  Paige Cook was admitted with the working diagnosis of C diff related diarrhea.   87 yo female with the past medical history systolic heart failure, CKD stage 3b, CAD, OSA and remote history of colon and bladder cancer who presented with persistent diarrhea. Reported 7 days of diarrhea with no abdominal pain, no nausea or vomiting. On her initial physical examination her blood pressure was 131/51 with HR 69 and RR 22 with oxygen saturation 99%, lungs clear to auscultation, heart S1 S2 present and rhythmic, abdomen soft and non tender, no lower extremity edema.   Sodium 140, potassium 3.2, chloride 108, bicarb 15, glucose 122, BUN 110, creatinine 3.49, white count 5.4, hemoglobin 9.8, hematocrit 28.6, platelets 188. SARS COVID-19 negative.  Still sedated and patient positive, negative toxin.  Assessment & Plan:   Principal Problem:   C. difficile diarrhea Active Problems:   CAD (coronary artery disease)   OSA (obstructive sleep apnea)   HTN (hypertension)   Chronic systolic CHF (congestive heart failure) (HCC)   Acute-on-chronic kidney injury (HCC)   1. C diff diarrhea present on admission. Patient with no significant abdominal pain. Her last bowel movement last night. She has positive antigen but negative toxin in the setting of severe diarrhea, clinical picture consistent with C diff infection. She had multiple abdominal surgeries in the past.   Plan for antibiotic therapy with oral Vancomycin. Continue close follow up of cell count and electrolytes. Discontinue loperamide.  Advance diet as tolerated. Consult nutrition.   2. AKI on CKD stage 3b, non anion gap metabolic acidosis/ hypokalemia. Renal function with serum cr down to 1,95 with K of 3,7 and serum bicarbonate at 15, CL 116.  Continue gentle hydration with half normal saline, continue close follow  up on renal function and electrolytes. Add oral sodium bicarbonate.    3. HTN/ systolic heart failure. No clinical signs of heart failure exacerbation, continue gentle hydration with IV fluids. Continue blood pressure monitoring, on carvedilol and isosorbide/ hydralazine.   4. OSA. Continue home Cpap.   Patient continue to be at high risk for worsening C diff and renal failure   Status is: Inpatient  Remains inpatient appropriate because:IV treatments appropriate due to intensity of illness or inability to take PO   Dispo: The patient is from: Home              Anticipated d/c is to: Home              Patient currently is not medically stable to d/c.   Difficult to place patient No   DVT prophylaxis: Heparin   Code Status:   full  Family Communication:  I spoke with patient's daughter at the bedside, we talked in detail about patient's condition, plan of care and prognosis and all questions were addressed.    Antimicrobials:   Oral vancomycin     Subjective: Patient feeling better but not yet back to baseline, diarrhea slowing down last episode last night, no nausea or vomiting.   Objective: Vitals:   05/22/21 2103 05/23/21 0148 05/23/21 0527 05/23/21 1341  BP: (!) 152/56 (!) 124/50 (!) 111/52 (!) 114/46  Pulse: 74 68 68 66  Resp: 20 17 16 18   Temp: 98.3 F (36.8 C) 98.8 F (37.1 C) 98.5 F (36.9 C)   TempSrc: Oral  Oral   SpO2: 100% 96% 96% 100%  Weight:      Height:        Intake/Output Summary (Last 24 hours) at 05/23/2021 1510 Last data filed at 05/23/2021 0200 Gross per 24 hour  Intake 1520 ml  Output --  Net 1520 ml   Filed Weights   05/22/21 1302  Weight: 46.7 kg    Examination:   General: Not in pain or dyspnea, deconditioned  Neurology: Awake and alert, non focal  E ENT: mild pallor, no icterus, oral mucosa moist Cardiovascular: No JVD. S1-S2 present, rhythmic, no gallops, rubs, or murmurs. No lower extremity edema. Pulmonary: positive breath  sounds bilaterally, with wheezing, rhonchi or rales. Gastrointestinal. Abdomen mild distended but not tender Skin. No rashes Musculoskeletal: no joint deformities     Data Reviewed: I have personally reviewed following labs and imaging studies  CBC: Recent Labs  Lab 05/22/21 1350  WBC 5.4  NEUTROABS 3.7  HGB 9.8*  HCT 28.6*  MCV 96.3  PLT 676   Basic Metabolic Panel: Recent Labs  Lab 05/22/21 1350 05/23/21 0325  NA 140 141  K 3.2* 3.7  CL 108 116*  CO2 15* 15*  GLUCOSE 122* 118*  BUN 110* 99*  CREATININE 2.49* 1.95*  CALCIUM 9.2 8.5*   GFR: Estimated Creatinine Clearance: 13.9 mL/min (A) (by C-G formula based on SCr of 1.95 mg/dL (H)). Liver Function Tests: Recent Labs  Lab 05/22/21 1350  AST 17  ALT 9  ALKPHOS 62  BILITOT 0.3  PROT 6.7  ALBUMIN 4.0   No results for input(s): LIPASE, AMYLASE in the last 168 hours. No results for input(s): AMMONIA in the last 168 hours. Coagulation Profile: No results for input(s): INR, PROTIME in the last 168 hours. Cardiac Enzymes: No results for input(s): CKTOTAL, CKMB, CKMBINDEX, TROPONINI in the last 168 hours. BNP (last 3 results) No results for input(s): PROBNP in the last 8760 hours. HbA1C: No results for input(s): HGBA1C in the last 72 hours. CBG: No results for input(s): GLUCAP in the last 168 hours. Lipid Profile: No results for input(s): CHOL, HDL, LDLCALC, TRIG, CHOLHDL, LDLDIRECT in the last 72 hours. Thyroid Function Tests: No results for input(s): TSH, T4TOTAL, FREET4, T3FREE, THYROIDAB in the last 72 hours. Anemia Panel: No results for input(s): VITAMINB12, FOLATE, FERRITIN, TIBC, IRON, RETICCTPCT in the last 72 hours.    Radiology Studies: I have reviewed all of the imaging during this hospital visit personally     Scheduled Meds: . vitamin C  500 mg Oral Q breakfast  . aspirin  81 mg Oral Q breakfast  . carvedilol  6.25 mg Oral BID WC  . cholecalciferol  2,000 Units Oral Daily  .  heparin  5,000 Units Subcutaneous Q8H  . hydrALAZINE  10 mg Oral TID  . isosorbide mononitrate  30 mg Oral Daily  . vancomycin  125 mg Oral QID  . vitamin B-12  250 mcg Oral Daily   Continuous Infusions:   LOS: 1 day        Keelie Zemanek Gerome Apley, MD

## 2021-05-24 LAB — BASIC METABOLIC PANEL
Anion gap: 7 (ref 5–15)
BUN: 80 mg/dL — ABNORMAL HIGH (ref 8–23)
CO2: 16 mmol/L — ABNORMAL LOW (ref 22–32)
Calcium: 8.6 mg/dL — ABNORMAL LOW (ref 8.9–10.3)
Chloride: 115 mmol/L — ABNORMAL HIGH (ref 98–111)
Creatinine, Ser: 1.88 mg/dL — ABNORMAL HIGH (ref 0.44–1.00)
GFR, Estimated: 25 mL/min — ABNORMAL LOW (ref >60)
Glucose, Bld: 98 mg/dL (ref 70–99)
Potassium: 3.8 mmol/L (ref 3.5–5.1)
Sodium: 138 mmol/L (ref 135–145)

## 2021-05-24 LAB — CBC WITH DIFFERENTIAL/PLATELET
Abs Immature Granulocytes: 0.01 10*3/uL (ref 0.00–0.07)
Basophils Absolute: 0 10*3/uL (ref 0.0–0.1)
Basophils Relative: 1 %
Eosinophils Absolute: 0.3 10*3/uL (ref 0.0–0.5)
Eosinophils Relative: 6 %
HCT: 25 % — ABNORMAL LOW (ref 36.0–46.0)
Hemoglobin: 8.2 g/dL — ABNORMAL LOW (ref 12.0–15.0)
Immature Granulocytes: 0 %
Lymphocytes Relative: 25 %
Lymphs Abs: 1.2 10*3/uL (ref 0.7–4.0)
MCH: 33.1 pg (ref 26.0–34.0)
MCHC: 32.8 g/dL (ref 30.0–36.0)
MCV: 100.8 fL — ABNORMAL HIGH (ref 80.0–100.0)
Monocytes Absolute: 0.7 10*3/uL (ref 0.1–1.0)
Monocytes Relative: 15 %
Neutro Abs: 2.6 10*3/uL (ref 1.7–7.7)
Neutrophils Relative %: 53 %
Platelets: 154 10*3/uL (ref 150–400)
RBC: 2.48 MIL/uL — ABNORMAL LOW (ref 3.87–5.11)
RDW: 13.9 % (ref 11.5–15.5)
WBC: 4.7 10*3/uL (ref 4.0–10.5)
nRBC: 0 % (ref 0.0–0.2)

## 2021-05-24 MED ORDER — ADULT MULTIVITAMIN W/MINERALS CH
1.0000 | ORAL_TABLET | Freq: Every day | ORAL | Status: DC
  Administered 2021-05-24 – 2021-05-25 (×2): 1 via ORAL
  Filled 2021-05-24 (×2): qty 1

## 2021-05-24 NOTE — Progress Notes (Signed)
Initial Nutrition Assessment  DOCUMENTATION CODES:   Not applicable  INTERVENTION:  - will order Magic Cup BID with lunch and dinner meals, each supplement provides 290 kcal and 9 grams of protein. - will order 1 tablet multivitamin with minerals/day.  - liberalized diet from Heart Healthy to Regular.    NUTRITION DIAGNOSIS:   Increased nutrient needs related to acute illness as evidenced by estimated needs.  GOAL:   Patient will meet greater than or equal to 90% of their needs  MONITOR:   PO intake,Supplement acceptance,Labs,Weight trends  REASON FOR ASSESSMENT:   Consult Assessment of nutrition requirement/status  ASSESSMENT:   87 year-old female with the medical history of CHF, stage 3 CKD, CAD, OSA and remote hx of colon and bladder cancers. She presented with persistent diarrhea x7 days without abdominal pain or N/V. Reported 7 days of diarrhea with no abdominal pain, no nausea or vomiting.  Diet advanced from FLD to Heart Healthy yesterday at 1550. No intakes documented since that time. Patient denies any changes in appetite PTA. She did not experience any abdominal pain/pressure or nausea or any episodes of vomiting PTA. No foods or beverages seemed to make diarrhea better or worse.  Weight on 6/6 was 103 lb, weight on 01/17/21 was 103 lb, and weight on 01/27/20 was 107 lb.   Per notes: - cdiff--positive antigen but negative toxin  - AKI on stage 3 CKD - from home with plan to return home at the time of d/c   Labs reviewed; Cl: 115 mmol/l, BUN: 80 mg/dl, creatinine: 1.88 mg/dl, Ca: 8.6 mg/dl, GFR: 25 ml/min.  Medications reviewed; 500 mg ascorbic acid/day, 2000 units cholecalciferol/day, 250 mcg oral cyanocobalamin/day. IVF; D5-NS @ 75 ml/hr (306 kcal).     NUTRITION - FOCUSED PHYSICAL EXAM:  Flowsheet Row Most Recent Value  Orbital Region No depletion  Upper Arm Region Mild depletion  Thoracic and Lumbar Region Unable to assess  Buccal Region No depletion   Temple Region Mild depletion  Clavicle Bone Region Mild depletion  Clavicle and Acromion Bone Region Mild depletion  Scapular Bone Region No depletion  Dorsal Hand Mild depletion  Patellar Region Unable to assess  Anterior Thigh Region Unable to assess  Posterior Calf Region Unable to assess  Edema (RD Assessment) Unable to assess  Hair Reviewed  Eyes Reviewed  Mouth Reviewed  Skin Reviewed  Nails Reviewed       Diet Order:   Diet Order            Diet Heart Room service appropriate? Yes; Fluid consistency: Thin  Diet effective now                 EDUCATION NEEDS:   No education needs have been identified at this time  Skin:  Skin Assessment: Reviewed RN Assessment  Last BM:  6/7  Height:   Ht Readings from Last 1 Encounters:  05/22/21 5\' 1"  (1.549 m)    Weight:   Wt Readings from Last 1 Encounters:  05/22/21 46.7 kg     Estimated Nutritional Needs:  Kcal:  1170-1350 kcal Protein:  60-70 grams Fluid:  >/= 1.8 L/day     Jarome Matin, MS, RD, LDN, CNSC Inpatient Clinical Dietitian RD pager # available in [facility]  After hours/weekend pager # available in [facility]

## 2021-05-24 NOTE — Plan of Care (Signed)
  Problem: Activity: Goal: Risk for activity intolerance will decrease Outcome: Progressing   Problem: Nutrition: Goal: Adequate nutrition will be maintained Outcome: Progressing   

## 2021-05-24 NOTE — Progress Notes (Signed)
PROGRESS NOTE    ILLANA Cook  WGN:[id number] DOB: 13-Oct-1928 DOA: 05/22/2021 PCP: Burnard Bunting, MD    Brief Narrative:  87 yo female with the past medical history systolic heart failure, CKD stage 3b, CAD, OSA and remote history of colon and bladder cancer who presented with persistent diarrhea. Reported 7 days of diarrhea with no abdominal pain, no nausea or vomiting.   Assessment & Plan:   C. difficile colitis  -Presented to the ED with ongoing diarrhea for 7 days, C. difficile PCR was positive but toxin was negative, she was started on oral vancomycin with good clinical response  -Discontinue IV fluids today -Labs in a.m. -Discharge planning  AKI on CKD stage 3b, non anion gap metabolic acidosis/ hypokalemia.  -Creatinine improving with hydration, down to 1.8  HTN chronic systolic heart failure.  -Clinically appears euvolemic Continue blood pressure monitoring, on carvedilol and isosorbide/ hydralazine.   OSA. Continue home Cpap.   DVT prophylaxis: Heparin   Code Status:   full  Family Communication:  Discussed with daughter at bedside   Status is: Inpatient  Remains inpatient appropriate because:IV treatments appropriate due to intensity of illness or inability to take PO   Dispo: The patient is from: Home              Anticipated d/c is to: Home              Patient currently is not medically stable to d/c.   Difficult to place patient No  Antimicrobials:   Oral vancomycin     Subjective: -Feels better, no further diarrhea, tolerating diet  Objective: Vitals:   05/23/21 0527 05/23/21 1341 05/23/21 2149 05/24/21 0534  BP: (!) 111/52 (!) 114/46 (!) 113/45 (!) 112/52  Pulse: 68 66 61 69  Resp: 16 18 17 16   Temp: 98.5 F (36.9 C)  97.8 F (36.6 C) 98.2 F (36.8 C)  TempSrc: Oral  Oral Oral  SpO2: 96% 100% 98% 96%  Weight:      Height:        Intake/Output Summary (Last 24 hours) at 05/24/2021 1419 Last data filed at 05/24/2021 0600 Gross per 24  hour  Intake 743.78 ml  Output --  Net 743.78 ml   Filed Weights   05/22/21 1302  Weight: 46.7 kg    Examination:   Gen: Elderly frail, chronically ill-appearing awake, Alert, Oriented X 3, no distress HEENT: no JVD Lungs: Clear CVS: S1S2/RRR Abd: soft, Non tender, non distended, BS present Extremities: No edema Skin: no new rashes on exposed skin    Data Reviewed: I have personally reviewed following labs and imaging studies  CBC: Recent Labs  Lab 05/22/21 1350 05/24/21 0334  WBC 5.4 4.7  NEUTROABS 3.7 2.6  HGB 9.8* 8.2*  HCT 28.6* 25.0*  MCV 96.3 100.8*  PLT 188 784   Basic Metabolic Panel: Recent Labs  Lab 05/22/21 1350 05/23/21 0325 05/24/21 0334  NA 140 141 138  K 3.2* 3.7 3.8  CL 108 116* 115*  CO2 15* 15* 16*  GLUCOSE 122* 118* 98  BUN 110* 99* 80*  CREATININE 2.49* 1.95* 1.88*  CALCIUM 9.2 8.5* 8.6*   GFR: Estimated Creatinine Clearance: 14.4 mL/min (A) (by C-G formula based on SCr of 1.88 mg/dL (H)). Liver Function Tests: Recent Labs  Lab 05/22/21 1350  AST 17  ALT 9  ALKPHOS 62  BILITOT 0.3  PROT 6.7  ALBUMIN 4.0   No results for input(s): LIPASE, AMYLASE in the last 168 hours.  No results for input(s): AMMONIA in the last 168 hours. Coagulation Profile: No results for input(s): INR, PROTIME in the last 168 hours. Cardiac Enzymes: No results for input(s): CKTOTAL, CKMB, CKMBINDEX, TROPONINI in the last 168 hours. BNP (last 3 results) No results for input(s): PROBNP in the last 8760 hours. HbA1C: No results for input(s): HGBA1C in the last 72 hours. CBG: No results for input(s): GLUCAP in the last 168 hours. Lipid Profile: No results for input(s): CHOL, HDL, LDLCALC, TRIG, CHOLHDL, LDLDIRECT in the last 72 hours. Thyroid Function Tests: No results for input(s): TSH, T4TOTAL, FREET4, T3FREE, THYROIDAB in the last 72 hours. Anemia Panel: No results for input(s): VITAMINB12, FOLATE, FERRITIN, TIBC, IRON, RETICCTPCT in the last 72  hours.  Scheduled Meds: . vitamin C  500 mg Oral Q breakfast  . aspirin  81 mg Oral Q breakfast  . carvedilol  6.25 mg Oral BID WC  . cholecalciferol  2,000 Units Oral Daily  . heparin  5,000 Units Subcutaneous Q8H  . hydrALAZINE  10 mg Oral TID  . isosorbide mononitrate  30 mg Oral Daily  . multivitamin with minerals  1 tablet Oral Daily  . sodium bicarbonate  650 mg Oral TID  . vancomycin  125 mg Oral QID  . vitamin B-12  250 mcg Oral Daily   Continuous Infusions: . dextrose 5 % and 0.45% NaCl 75 mL/hr at 05/24/21 1300     LOS: 2 days    Domenic Polite, MD

## 2021-05-25 LAB — CBC
HCT: 24.4 % — ABNORMAL LOW (ref 36.0–46.0)
Hemoglobin: 8 g/dL — ABNORMAL LOW (ref 12.0–15.0)
MCH: 33.1 pg (ref 26.0–34.0)
MCHC: 32.8 g/dL (ref 30.0–36.0)
MCV: 100.8 fL — ABNORMAL HIGH (ref 80.0–100.0)
Platelets: 143 10*3/uL — ABNORMAL LOW (ref 150–400)
RBC: 2.42 MIL/uL — ABNORMAL LOW (ref 3.87–5.11)
RDW: 13.8 % (ref 11.5–15.5)
WBC: 4.2 10*3/uL (ref 4.0–10.5)
nRBC: 0 % (ref 0.0–0.2)

## 2021-05-25 LAB — BASIC METABOLIC PANEL
Anion gap: 6 (ref 5–15)
BUN: 66 mg/dL — ABNORMAL HIGH (ref 8–23)
CO2: 17 mmol/L — ABNORMAL LOW (ref 22–32)
Calcium: 8.6 mg/dL — ABNORMAL LOW (ref 8.9–10.3)
Chloride: 115 mmol/L — ABNORMAL HIGH (ref 98–111)
Creatinine, Ser: 1.97 mg/dL — ABNORMAL HIGH (ref 0.44–1.00)
GFR, Estimated: 24 mL/min — ABNORMAL LOW (ref >60)
Glucose, Bld: 100 mg/dL — ABNORMAL HIGH (ref 70–99)
Potassium: 3.7 mmol/L (ref 3.5–5.1)
Sodium: 138 mmol/L (ref 135–145)

## 2021-05-25 MED ORDER — VANCOMYCIN HCL 125 MG PO CAPS: 125.0000 mg | ORAL_CAPSULE | Freq: Four times a day (QID) | ORAL | 0 refills | Status: AC

## 2021-05-25 NOTE — Care Management Important Message (Signed)
Important Message  Patient Details IM Letter given to the Patient. Name: Paige Cook MRN: [id number] Date of Birth: 01-05-1928   Medicare Important Message Given:  Yes     Kerin Salen 05/25/2021, 10:38 AM

## 2021-05-25 NOTE — Plan of Care (Signed)
  Problem: Activity: Goal: Risk for activity intolerance will decrease Outcome: Progressing   Problem: Safety: Goal: Ability to remain free from injury will improve Outcome: Progressing   

## 2021-05-25 NOTE — Progress Notes (Signed)
Discharge package printed and instructions given to daughter, Lorelee Cover. Verbalizes understanding. No question. Rx sent with pt.

## 2021-05-25 NOTE — Discharge Summary (Signed)
Physician Discharge Summary  Paige Cook JHE:[id number] DOB: Dec 08, 1928 DOA: 05/22/2021  PCP: Burnard Bunting, MD  Admit date: 05/22/2021 Discharge date: 05/25/2021  Time spent 65minutes  Recommendations for Outpatient Follow-up:  PCP in 1 week  Discharge Diagnoses:  Principal Problem:   C. difficile diarrhea Active Problems:   CAD (coronary artery disease)   OSA (obstructive sleep apnea)   HTN (hypertension)   Chronic systolic CHF (congestive heart failure) ([place])   Acute-on-chronic kidney injury ([place])   Discharge Condition: Stable  Diet recommendation: Low-sodium, heart healthy  Filed Weights   05/22/21 1302  Weight: 46.7 kg    History of present illness:  87 yo female with the past medical history systolic heart failure, CKD stage 3b, CAD, OSA and remote history of colon and bladder cancer who presented with persistent diarrhea. Reported 7 days of diarrhea with no abdominal pain, no nausea or vomiting  Hospital Course:   C. difficile colitis  -Presented to the ED with ongoing diarrhea for 7 days, C. difficile PCR was positive but toxin was negative, she was started on oral vancomycin with good clinical response  -Improved and stable now, diarrhea has resolved, tolerating diet -Discharged home in stable condition on oral vancomycin to complete 10-day course   AKI on CKD stage 3b, non anion gap metabolic acidosis/ hypokalemia.  -Creatinine in the 2 range on admission, baseline around 1.7-1.9, creatinine stabilized with hydration, back to baseline   HTN chronic systolic heart failure. -Clinically appears euvolemic -Stable, continued carvedilol and isosorbide/ hydralazine.,  Resumed low-dose Lasix at discharge   OSA. Continue home Cpap.    Discharge Exam: Vitals:   05/25/21 0407 05/25/21 0826  BP: (!) 130/50 (!) 113/42  Pulse: 68   Resp: 16   Temp: 99.3 F (37.4 C)   SpO2: 97%     General: Frail elderly female, AAOx3, no distress Cardiovascular: S1-S2,  regular rate rhythm Respiratory: Clear  Discharge Instructions   Discharge Instructions     Diet - low sodium heart healthy   Complete by: As directed    Increase activity slowly   Complete by: As directed       Allergies as of 05/25/2021       Reactions   Penicillins Hives   Has patient had a PCN reaction causing immediate rash, facial/tongue/throat swelling, SOB or lightheadedness with hypotension: Yes Has patient had a PCN reaction causing severe rash involving mucus membranes or skin necrosis: Yes Has patient had a PCN reaction that required hospitalization No Has patient had a PCN reaction occurring within the last 10 years: No If all of the above answers are "NO", then may proceed with Cephalosporin use.   Tramadol Nausea Only, Other (See Comments)   shakes        Medication List     TAKE these medications    aspirin 81 MG chewable tablet Chew 81 mg by mouth daily with breakfast.   carvedilol 6.25 MG tablet Commonly known as: COREG TAKE 1 TABLET BY MOUTH TWICE A DAY WITH MEALS   furosemide 40 MG tablet Commonly known as: LASIX Take 1/2 tablet 20 mg daily   hydrALAZINE 10 MG tablet Commonly known as: APRESOLINE TAKE 1 TABLET BY MOUTH THREE TIMES A DAY   isosorbide mononitrate 30 MG 24 hr tablet Commonly known as: IMDUR TAKE 1 TABLET BY MOUTH EVERY DAY   vancomycin 125 MG capsule Commonly known as: VANCOCIN Take 1 capsule (125 mg total) by mouth 4 (four) times daily for 6 days.  vitamin B-12 250 MCG tablet Commonly known as: CYANOCOBALAMIN Take 250 mcg by mouth daily.   vitamin C 250 MG tablet Commonly known as: ASCORBIC ACID Take 500 mg by mouth daily with breakfast.   Vitamin D3 50 MCG (2000 UT) Tabs Take 50 mcg by mouth.       Allergies  Allergen Reactions   Penicillins Hives    Has patient had a PCN reaction causing immediate rash, facial/tongue/throat swelling, SOB or lightheadedness with hypotension: Yes Has patient had a PCN  reaction causing severe rash involving mucus membranes or skin necrosis: Yes Has patient had a PCN reaction that required hospitalization No Has patient had a PCN reaction occurring within the last 10 years: No If all of the above answers are "NO", then may proceed with Cephalosporin use.    Tramadol Nausea Only and Other (See Comments)    shakes    Follow-up Information     Burnard Bunting, MD Follow up in 1 week(s).   Specialty: Internal Medicine Contact information: [place] [address] [phone]                  The results of significant diagnostics from this hospitalization (including imaging, microbiology, ancillary and laboratory) are listed below for reference.    Significant Diagnostic Studies: No results found.  Microbiology: Recent Results (from the past 240 hour(s))  Resp Panel by RT-PCR (Flu A&B, Covid) Nasopharyngeal Swab     Status: None   Collection Time: 05/22/21  3:35 PM   Specimen: Nasopharyngeal Swab; Nasopharyngeal(NP) swabs in vial transport medium  Result Value Ref Range Status   SARS Coronavirus 2 by RT PCR NEGATIVE NEGATIVE Final    Comment: (NOTE) SARS-CoV-2 target nucleic acids are NOT DETECTED.  The SARS-CoV-2 RNA is generally detectable in upper respiratory specimens during the acute phase of infection. The lowest concentration of SARS-CoV-2 viral copies this assay can detect is 138 copies/mL. A negative result does not preclude SARS-Cov-2 infection and should not be used as the sole basis for treatment or other patient management decisions. A negative result may occur with  improper specimen collection/handling, submission of specimen other than nasopharyngeal swab, presence of viral mutation(s) within the areas targeted by this assay, and inadequate number of viral copies(<138 copies/mL). A negative result must be combined with clinical observations, patient history, and epidemiological information. The  expected result is Negative.  Fact Sheet for Patients:  EntrepreneurPulse.com.au  Fact Sheet for Healthcare Providers:  IncredibleEmployment.be  This test is no t yet approved or cleared by the [place] FDA and  has been authorized for detection and/or diagnosis of SARS-CoV-2 by FDA under an Emergency Use Authorization (EUA). This EUA will remain  in effect (meaning this test can be used) for the duration of the COVID-19 declaration under Section 564(b)(1) of the Act, 21 U.S.C.section 360bbb-3(b)(1), unless the authorization is terminated  or revoked sooner.       Influenza A by PCR NEGATIVE NEGATIVE Final   Influenza B by PCR NEGATIVE NEGATIVE Final    Comment: (NOTE) The Xpert Xpress SARS-CoV-2/FLU/RSV plus assay is intended as an aid in the diagnosis of influenza from Nasopharyngeal swab specimens and should not be used as a sole basis for treatment. Nasal washings and aspirates are unacceptable for Xpert Xpress SARS-CoV-2/FLU/RSV testing.  Fact Sheet for Patients: EntrepreneurPulse.com.au  Fact Sheet for Healthcare Providers: IncredibleEmployment.be  This test is not yet approved or cleared by the [place] FDA and has been authorized for detection and/or  diagnosis of SARS-CoV-2 by FDA under an Emergency Use Authorization (EUA). This EUA will remain in effect (meaning this test can be used) for the duration of the COVID-19 declaration under Section 564(b)(1) of the Act, 21 U.S.C. section 360bbb-3(b)(1), unless the authorization is terminated or revoked.  Performed at [facility], [address] [place], [place] [address]   Gastrointestinal Panel by PCR , Stool     Status: None   Collection Time: 05/22/21  7:57 PM   Specimen: Stool  Result Value Ref Range Status   Campylobacter species NOT DETECTED NOT DETECTED Final   Plesimonas shigelloides NOT DETECTED NOT  DETECTED Final   Salmonella species NOT DETECTED NOT DETECTED Final   Yersinia enterocolitica NOT DETECTED NOT DETECTED Final   Vibrio species NOT DETECTED NOT DETECTED Final   Vibrio cholerae NOT DETECTED NOT DETECTED Final   Enteroaggregative E coli (EAEC) NOT DETECTED NOT DETECTED Final   Enteropathogenic E coli (EPEC) NOT DETECTED NOT DETECTED Final   Enterotoxigenic E coli (ETEC) NOT DETECTED NOT DETECTED Final   Shiga like toxin producing E coli (STEC) NOT DETECTED NOT DETECTED Final   Shigella/Enteroinvasive E coli (EIEC) NOT DETECTED NOT DETECTED Final   Cryptosporidium NOT DETECTED NOT DETECTED Final   Cyclospora cayetanensis NOT DETECTED NOT DETECTED Final   Entamoeba histolytica NOT DETECTED NOT DETECTED Final   Giardia lamblia NOT DETECTED NOT DETECTED Final   Adenovirus F40/41 NOT DETECTED NOT DETECTED Final   Astrovirus NOT DETECTED NOT DETECTED Final   Norovirus GI/GII NOT DETECTED NOT DETECTED Final   Rotavirus A NOT DETECTED NOT DETECTED Final   Sapovirus (I, II, IV, and V) NOT DETECTED NOT DETECTED Final    Comment: Performed at [facility], [address] [place], [place] [address]  C Difficile Quick Screen w PCR reflex     Status: Abnormal   Collection Time: 05/22/21  7:57 PM   Specimen: Stool  Result Value Ref Range Status   C Diff antigen POSITIVE (A) NEGATIVE Final   C Diff toxin NEGATIVE NEGATIVE Final   C Diff interpretation Results are indeterminate. See PCR results.  Final    Comment: Performed at [facility], [place] [address] [place], [place] [address]  C. Diff by PCR, Reflexed     Status: Abnormal   Collection Time: 05/22/21  7:57 PM  Result Value Ref Range Status   Toxigenic C. Difficile by PCR POSITIVE (A) NEGATIVE Final    Comment: Positive for toxigenic C. difficile with little to no toxin production. Only treat if clinical presentation suggests symptomatic illness. Performed at [facility], [place] [address], [place],  [place] [address]      Labs: Basic Metabolic Panel: Recent Labs  Lab 05/22/21 1350 05/23/21 0325 05/24/21 0334 05/25/21 0322  NA 140 141 138 138  K 3.2* 3.7 3.8 3.7  CL 108 116* 115* 115*  CO2 15* 15* 16* 17*  GLUCOSE 122* 118* 98 100*  BUN 110* 99* 80* 66*  CREATININE 2.49* 1.95* 1.88* 1.97*  CALCIUM 9.2 8.5* 8.6* 8.6*   Liver Function Tests: Recent Labs  Lab 05/22/21 1350  AST 17  ALT 9  ALKPHOS 62  BILITOT 0.3  PROT 6.7  ALBUMIN 4.0   No results for input(s): LIPASE, AMYLASE in the last 168 hours. No results for input(s): AMMONIA in the last 168 hours. CBC: Recent Labs  Lab 05/22/21 1350 05/24/21 0334 05/25/21 0322  WBC 5.4 4.7 4.2  NEUTROABS 3.7 2.6  --  HGB 9.8* 8.2* 8.0*  HCT 28.6* 25.0* 24.4*  MCV 96.3 100.8* 100.8*  PLT 188 154 143*   Cardiac Enzymes: No results for input(s): CKTOTAL, CKMB, CKMBINDEX, TROPONINI in the last 168 hours. BNP: BNP (last 3 results) No results for input(s): BNP in the last 8760 hours.  ProBNP (last 3 results) No results for input(s): PROBNP in the last 8760 hours.  CBG: No results for input(s): GLUCAP in the last 168 hours.     Signed:  Domenic Polite MD.  [facility] 05/25/2021, 2:24 PM

## 2021-06-01 DIAGNOSIS — A0472 Enterocolitis due to Clostridium difficile, not specified as recurrent: Secondary | ICD-10-CM | POA: Diagnosis not present

## 2021-06-01 DIAGNOSIS — G4733 Obstructive sleep apnea (adult) (pediatric): Secondary | ICD-10-CM | POA: Diagnosis not present

## 2021-06-01 DIAGNOSIS — I130 Hypertensive heart and chronic kidney disease with heart failure and stage 1 through stage 4 chronic kidney disease, or unspecified chronic kidney disease: Secondary | ICD-10-CM | POA: Diagnosis not present

## 2021-06-01 DIAGNOSIS — N179 Acute kidney failure, unspecified: Secondary | ICD-10-CM | POA: Diagnosis not present

## 2021-06-01 DIAGNOSIS — R531 Weakness: Secondary | ICD-10-CM | POA: Diagnosis not present

## 2021-06-01 DIAGNOSIS — I509 Heart failure, unspecified: Secondary | ICD-10-CM | POA: Diagnosis not present

## 2021-06-01 DIAGNOSIS — D638 Anemia in other chronic diseases classified elsewhere: Secondary | ICD-10-CM | POA: Diagnosis not present

## 2021-06-01 DIAGNOSIS — N1832 Chronic kidney disease, stage 3b: Secondary | ICD-10-CM | POA: Diagnosis not present

## 2021-06-02 DIAGNOSIS — I2510 Atherosclerotic heart disease of native coronary artery without angina pectoris: Secondary | ICD-10-CM | POA: Diagnosis not present

## 2021-06-02 DIAGNOSIS — N179 Acute kidney failure, unspecified: Secondary | ICD-10-CM | POA: Diagnosis not present

## 2021-06-02 DIAGNOSIS — Z8551 Personal history of malignant neoplasm of bladder: Secondary | ICD-10-CM | POA: Diagnosis not present

## 2021-06-02 DIAGNOSIS — H9190 Unspecified hearing loss, unspecified ear: Secondary | ICD-10-CM | POA: Diagnosis not present

## 2021-06-02 DIAGNOSIS — M5136 Other intervertebral disc degeneration, lumbar region: Secondary | ICD-10-CM | POA: Diagnosis not present

## 2021-06-02 DIAGNOSIS — I5020 Unspecified systolic (congestive) heart failure: Secondary | ICD-10-CM | POA: Diagnosis not present

## 2021-06-02 DIAGNOSIS — G4733 Obstructive sleep apnea (adult) (pediatric): Secondary | ICD-10-CM | POA: Diagnosis not present

## 2021-06-02 DIAGNOSIS — M1990 Unspecified osteoarthritis, unspecified site: Secondary | ICD-10-CM | POA: Diagnosis not present

## 2021-06-02 DIAGNOSIS — E785 Hyperlipidemia, unspecified: Secondary | ICD-10-CM | POA: Diagnosis not present

## 2021-06-02 DIAGNOSIS — D631 Anemia in chronic kidney disease: Secondary | ICD-10-CM | POA: Diagnosis not present

## 2021-06-02 DIAGNOSIS — N1832 Chronic kidney disease, stage 3b: Secondary | ICD-10-CM | POA: Diagnosis not present

## 2021-06-02 DIAGNOSIS — Z85038 Personal history of other malignant neoplasm of large intestine: Secondary | ICD-10-CM | POA: Diagnosis not present

## 2021-06-02 DIAGNOSIS — Z8619 Personal history of other infectious and parasitic diseases: Secondary | ICD-10-CM | POA: Diagnosis not present

## 2021-06-02 DIAGNOSIS — I130 Hypertensive heart and chronic kidney disease with heart failure and stage 1 through stage 4 chronic kidney disease, or unspecified chronic kidney disease: Secondary | ICD-10-CM | POA: Diagnosis not present

## 2021-06-02 DIAGNOSIS — M419 Scoliosis, unspecified: Secondary | ICD-10-CM | POA: Diagnosis not present

## 2021-06-02 DIAGNOSIS — M8580 Other specified disorders of bone density and structure, unspecified site: Secondary | ICD-10-CM | POA: Diagnosis not present

## 2021-06-02 DIAGNOSIS — Z7982 Long term (current) use of aspirin: Secondary | ICD-10-CM | POA: Diagnosis not present

## 2021-06-06 DIAGNOSIS — N179 Acute kidney failure, unspecified: Secondary | ICD-10-CM | POA: Diagnosis not present

## 2021-06-06 DIAGNOSIS — I130 Hypertensive heart and chronic kidney disease with heart failure and stage 1 through stage 4 chronic kidney disease, or unspecified chronic kidney disease: Secondary | ICD-10-CM | POA: Diagnosis not present

## 2021-06-06 DIAGNOSIS — I5020 Unspecified systolic (congestive) heart failure: Secondary | ICD-10-CM | POA: Diagnosis not present

## 2021-06-06 DIAGNOSIS — D631 Anemia in chronic kidney disease: Secondary | ICD-10-CM | POA: Diagnosis not present

## 2021-06-06 DIAGNOSIS — N1832 Chronic kidney disease, stage 3b: Secondary | ICD-10-CM | POA: Diagnosis not present

## 2021-06-06 DIAGNOSIS — I2510 Atherosclerotic heart disease of native coronary artery without angina pectoris: Secondary | ICD-10-CM | POA: Diagnosis not present

## 2021-06-09 DIAGNOSIS — I5020 Unspecified systolic (congestive) heart failure: Secondary | ICD-10-CM | POA: Diagnosis not present

## 2021-06-09 DIAGNOSIS — N179 Acute kidney failure, unspecified: Secondary | ICD-10-CM | POA: Diagnosis not present

## 2021-06-09 DIAGNOSIS — I2510 Atherosclerotic heart disease of native coronary artery without angina pectoris: Secondary | ICD-10-CM | POA: Diagnosis not present

## 2021-06-09 DIAGNOSIS — N1832 Chronic kidney disease, stage 3b: Secondary | ICD-10-CM | POA: Diagnosis not present

## 2021-06-09 DIAGNOSIS — D631 Anemia in chronic kidney disease: Secondary | ICD-10-CM | POA: Diagnosis not present

## 2021-06-09 DIAGNOSIS — I130 Hypertensive heart and chronic kidney disease with heart failure and stage 1 through stage 4 chronic kidney disease, or unspecified chronic kidney disease: Secondary | ICD-10-CM | POA: Diagnosis not present

## 2021-06-12 DIAGNOSIS — D631 Anemia in chronic kidney disease: Secondary | ICD-10-CM | POA: Diagnosis not present

## 2021-06-12 DIAGNOSIS — I5020 Unspecified systolic (congestive) heart failure: Secondary | ICD-10-CM | POA: Diagnosis not present

## 2021-06-12 DIAGNOSIS — N179 Acute kidney failure, unspecified: Secondary | ICD-10-CM | POA: Diagnosis not present

## 2021-06-12 DIAGNOSIS — I130 Hypertensive heart and chronic kidney disease with heart failure and stage 1 through stage 4 chronic kidney disease, or unspecified chronic kidney disease: Secondary | ICD-10-CM | POA: Diagnosis not present

## 2021-06-12 DIAGNOSIS — I2510 Atherosclerotic heart disease of native coronary artery without angina pectoris: Secondary | ICD-10-CM | POA: Diagnosis not present

## 2021-06-12 DIAGNOSIS — N1832 Chronic kidney disease, stage 3b: Secondary | ICD-10-CM | POA: Diagnosis not present

## 2021-06-14 DIAGNOSIS — I130 Hypertensive heart and chronic kidney disease with heart failure and stage 1 through stage 4 chronic kidney disease, or unspecified chronic kidney disease: Secondary | ICD-10-CM | POA: Diagnosis not present

## 2021-06-14 DIAGNOSIS — D631 Anemia in chronic kidney disease: Secondary | ICD-10-CM | POA: Diagnosis not present

## 2021-06-14 DIAGNOSIS — I2510 Atherosclerotic heart disease of native coronary artery without angina pectoris: Secondary | ICD-10-CM | POA: Diagnosis not present

## 2021-06-14 DIAGNOSIS — I5020 Unspecified systolic (congestive) heart failure: Secondary | ICD-10-CM | POA: Diagnosis not present

## 2021-06-14 DIAGNOSIS — N179 Acute kidney failure, unspecified: Secondary | ICD-10-CM | POA: Diagnosis not present

## 2021-06-14 DIAGNOSIS — N1832 Chronic kidney disease, stage 3b: Secondary | ICD-10-CM | POA: Diagnosis not present

## 2021-06-21 DIAGNOSIS — D631 Anemia in chronic kidney disease: Secondary | ICD-10-CM | POA: Diagnosis not present

## 2021-06-21 DIAGNOSIS — N179 Acute kidney failure, unspecified: Secondary | ICD-10-CM | POA: Diagnosis not present

## 2021-06-21 DIAGNOSIS — I5020 Unspecified systolic (congestive) heart failure: Secondary | ICD-10-CM | POA: Diagnosis not present

## 2021-06-21 DIAGNOSIS — I2510 Atherosclerotic heart disease of native coronary artery without angina pectoris: Secondary | ICD-10-CM | POA: Diagnosis not present

## 2021-06-21 DIAGNOSIS — I130 Hypertensive heart and chronic kidney disease with heart failure and stage 1 through stage 4 chronic kidney disease, or unspecified chronic kidney disease: Secondary | ICD-10-CM | POA: Diagnosis not present

## 2021-06-21 DIAGNOSIS — N1832 Chronic kidney disease, stage 3b: Secondary | ICD-10-CM | POA: Diagnosis not present

## 2021-06-23 DIAGNOSIS — N179 Acute kidney failure, unspecified: Secondary | ICD-10-CM | POA: Diagnosis not present

## 2021-06-23 DIAGNOSIS — D631 Anemia in chronic kidney disease: Secondary | ICD-10-CM | POA: Diagnosis not present

## 2021-06-23 DIAGNOSIS — I2510 Atherosclerotic heart disease of native coronary artery without angina pectoris: Secondary | ICD-10-CM | POA: Diagnosis not present

## 2021-06-23 DIAGNOSIS — I130 Hypertensive heart and chronic kidney disease with heart failure and stage 1 through stage 4 chronic kidney disease, or unspecified chronic kidney disease: Secondary | ICD-10-CM | POA: Diagnosis not present

## 2021-06-23 DIAGNOSIS — N1832 Chronic kidney disease, stage 3b: Secondary | ICD-10-CM | POA: Diagnosis not present

## 2021-06-23 DIAGNOSIS — I5020 Unspecified systolic (congestive) heart failure: Secondary | ICD-10-CM | POA: Diagnosis not present

## 2021-06-27 DIAGNOSIS — I2510 Atherosclerotic heart disease of native coronary artery without angina pectoris: Secondary | ICD-10-CM | POA: Diagnosis not present

## 2021-06-27 DIAGNOSIS — I130 Hypertensive heart and chronic kidney disease with heart failure and stage 1 through stage 4 chronic kidney disease, or unspecified chronic kidney disease: Secondary | ICD-10-CM | POA: Diagnosis not present

## 2021-06-27 DIAGNOSIS — D631 Anemia in chronic kidney disease: Secondary | ICD-10-CM | POA: Diagnosis not present

## 2021-06-27 DIAGNOSIS — I5020 Unspecified systolic (congestive) heart failure: Secondary | ICD-10-CM | POA: Diagnosis not present

## 2021-06-27 DIAGNOSIS — N1832 Chronic kidney disease, stage 3b: Secondary | ICD-10-CM | POA: Diagnosis not present

## 2021-06-27 DIAGNOSIS — N179 Acute kidney failure, unspecified: Secondary | ICD-10-CM | POA: Diagnosis not present

## 2021-06-30 DIAGNOSIS — I5020 Unspecified systolic (congestive) heart failure: Secondary | ICD-10-CM | POA: Diagnosis not present

## 2021-06-30 DIAGNOSIS — N1832 Chronic kidney disease, stage 3b: Secondary | ICD-10-CM | POA: Diagnosis not present

## 2021-06-30 DIAGNOSIS — D631 Anemia in chronic kidney disease: Secondary | ICD-10-CM | POA: Diagnosis not present

## 2021-06-30 DIAGNOSIS — I2510 Atherosclerotic heart disease of native coronary artery without angina pectoris: Secondary | ICD-10-CM | POA: Diagnosis not present

## 2021-06-30 DIAGNOSIS — N179 Acute kidney failure, unspecified: Secondary | ICD-10-CM | POA: Diagnosis not present

## 2021-06-30 DIAGNOSIS — I130 Hypertensive heart and chronic kidney disease with heart failure and stage 1 through stage 4 chronic kidney disease, or unspecified chronic kidney disease: Secondary | ICD-10-CM | POA: Diagnosis not present

## 2021-07-02 DIAGNOSIS — M8580 Other specified disorders of bone density and structure, unspecified site: Secondary | ICD-10-CM | POA: Diagnosis not present

## 2021-07-02 DIAGNOSIS — Z8551 Personal history of malignant neoplasm of bladder: Secondary | ICD-10-CM | POA: Diagnosis not present

## 2021-07-02 DIAGNOSIS — E785 Hyperlipidemia, unspecified: Secondary | ICD-10-CM | POA: Diagnosis not present

## 2021-07-02 DIAGNOSIS — N179 Acute kidney failure, unspecified: Secondary | ICD-10-CM | POA: Diagnosis not present

## 2021-07-02 DIAGNOSIS — M5136 Other intervertebral disc degeneration, lumbar region: Secondary | ICD-10-CM | POA: Diagnosis not present

## 2021-07-02 DIAGNOSIS — D631 Anemia in chronic kidney disease: Secondary | ICD-10-CM | POA: Diagnosis not present

## 2021-07-02 DIAGNOSIS — G4733 Obstructive sleep apnea (adult) (pediatric): Secondary | ICD-10-CM | POA: Diagnosis not present

## 2021-07-02 DIAGNOSIS — Z7982 Long term (current) use of aspirin: Secondary | ICD-10-CM | POA: Diagnosis not present

## 2021-07-02 DIAGNOSIS — M419 Scoliosis, unspecified: Secondary | ICD-10-CM | POA: Diagnosis not present

## 2021-07-02 DIAGNOSIS — M1990 Unspecified osteoarthritis, unspecified site: Secondary | ICD-10-CM | POA: Diagnosis not present

## 2021-07-02 DIAGNOSIS — H9190 Unspecified hearing loss, unspecified ear: Secondary | ICD-10-CM | POA: Diagnosis not present

## 2021-07-02 DIAGNOSIS — I2510 Atherosclerotic heart disease of native coronary artery without angina pectoris: Secondary | ICD-10-CM | POA: Diagnosis not present

## 2021-07-02 DIAGNOSIS — I5020 Unspecified systolic (congestive) heart failure: Secondary | ICD-10-CM | POA: Diagnosis not present

## 2021-07-02 DIAGNOSIS — Z85038 Personal history of other malignant neoplasm of large intestine: Secondary | ICD-10-CM | POA: Diagnosis not present

## 2021-07-02 DIAGNOSIS — Z8619 Personal history of other infectious and parasitic diseases: Secondary | ICD-10-CM | POA: Diagnosis not present

## 2021-07-02 DIAGNOSIS — I130 Hypertensive heart and chronic kidney disease with heart failure and stage 1 through stage 4 chronic kidney disease, or unspecified chronic kidney disease: Secondary | ICD-10-CM | POA: Diagnosis not present

## 2021-07-02 DIAGNOSIS — N1832 Chronic kidney disease, stage 3b: Secondary | ICD-10-CM | POA: Diagnosis not present

## 2021-07-05 DIAGNOSIS — D631 Anemia in chronic kidney disease: Secondary | ICD-10-CM | POA: Diagnosis not present

## 2021-07-05 DIAGNOSIS — I130 Hypertensive heart and chronic kidney disease with heart failure and stage 1 through stage 4 chronic kidney disease, or unspecified chronic kidney disease: Secondary | ICD-10-CM | POA: Diagnosis not present

## 2021-07-05 DIAGNOSIS — I5020 Unspecified systolic (congestive) heart failure: Secondary | ICD-10-CM | POA: Diagnosis not present

## 2021-07-05 DIAGNOSIS — N1832 Chronic kidney disease, stage 3b: Secondary | ICD-10-CM | POA: Diagnosis not present

## 2021-07-05 DIAGNOSIS — I2510 Atherosclerotic heart disease of native coronary artery without angina pectoris: Secondary | ICD-10-CM | POA: Diagnosis not present

## 2021-07-05 DIAGNOSIS — N179 Acute kidney failure, unspecified: Secondary | ICD-10-CM | POA: Diagnosis not present

## 2021-07-12 DIAGNOSIS — I5020 Unspecified systolic (congestive) heart failure: Secondary | ICD-10-CM | POA: Diagnosis not present

## 2021-07-12 DIAGNOSIS — I130 Hypertensive heart and chronic kidney disease with heart failure and stage 1 through stage 4 chronic kidney disease, or unspecified chronic kidney disease: Secondary | ICD-10-CM | POA: Diagnosis not present

## 2021-07-12 DIAGNOSIS — D631 Anemia in chronic kidney disease: Secondary | ICD-10-CM | POA: Diagnosis not present

## 2021-07-12 DIAGNOSIS — I2510 Atherosclerotic heart disease of native coronary artery without angina pectoris: Secondary | ICD-10-CM | POA: Diagnosis not present

## 2021-07-12 DIAGNOSIS — N179 Acute kidney failure, unspecified: Secondary | ICD-10-CM | POA: Diagnosis not present

## 2021-07-12 DIAGNOSIS — N1832 Chronic kidney disease, stage 3b: Secondary | ICD-10-CM | POA: Diagnosis not present

## 2021-07-18 DIAGNOSIS — R197 Diarrhea, unspecified: Secondary | ICD-10-CM | POA: Diagnosis not present

## 2021-07-19 DIAGNOSIS — I2510 Atherosclerotic heart disease of native coronary artery without angina pectoris: Secondary | ICD-10-CM | POA: Diagnosis not present

## 2021-07-19 DIAGNOSIS — D631 Anemia in chronic kidney disease: Secondary | ICD-10-CM | POA: Diagnosis not present

## 2021-07-19 DIAGNOSIS — I5020 Unspecified systolic (congestive) heart failure: Secondary | ICD-10-CM | POA: Diagnosis not present

## 2021-07-19 DIAGNOSIS — N1832 Chronic kidney disease, stage 3b: Secondary | ICD-10-CM | POA: Diagnosis not present

## 2021-07-19 DIAGNOSIS — I130 Hypertensive heart and chronic kidney disease with heart failure and stage 1 through stage 4 chronic kidney disease, or unspecified chronic kidney disease: Secondary | ICD-10-CM | POA: Diagnosis not present

## 2021-07-19 DIAGNOSIS — N179 Acute kidney failure, unspecified: Secondary | ICD-10-CM | POA: Diagnosis not present

## 2021-07-20 DIAGNOSIS — R82998 Other abnormal findings in urine: Secondary | ICD-10-CM | POA: Diagnosis not present

## 2021-07-20 DIAGNOSIS — I10 Essential (primary) hypertension: Secondary | ICD-10-CM | POA: Diagnosis not present

## 2021-07-20 DIAGNOSIS — E785 Hyperlipidemia, unspecified: Secondary | ICD-10-CM | POA: Diagnosis not present

## 2021-07-24 DIAGNOSIS — R2689 Other abnormalities of gait and mobility: Secondary | ICD-10-CM | POA: Diagnosis not present

## 2021-07-24 DIAGNOSIS — N1832 Chronic kidney disease, stage 3b: Secondary | ICD-10-CM | POA: Diagnosis not present

## 2021-07-24 DIAGNOSIS — C679 Malignant neoplasm of bladder, unspecified: Secondary | ICD-10-CM | POA: Diagnosis not present

## 2021-07-24 DIAGNOSIS — A0472 Enterocolitis due to Clostridium difficile, not specified as recurrent: Secondary | ICD-10-CM | POA: Diagnosis not present

## 2021-07-24 DIAGNOSIS — D638 Anemia in other chronic diseases classified elsewhere: Secondary | ICD-10-CM | POA: Diagnosis not present

## 2021-07-24 DIAGNOSIS — Z1389 Encounter for screening for other disorder: Secondary | ICD-10-CM | POA: Diagnosis not present

## 2021-07-24 DIAGNOSIS — Z0000 Encounter for general adult medical examination without abnormal findings: Secondary | ICD-10-CM | POA: Diagnosis not present

## 2021-07-24 DIAGNOSIS — Z1331 Encounter for screening for depression: Secondary | ICD-10-CM | POA: Diagnosis not present

## 2021-07-24 DIAGNOSIS — E785 Hyperlipidemia, unspecified: Secondary | ICD-10-CM | POA: Diagnosis not present

## 2021-07-24 DIAGNOSIS — I130 Hypertensive heart and chronic kidney disease with heart failure and stage 1 through stage 4 chronic kidney disease, or unspecified chronic kidney disease: Secondary | ICD-10-CM | POA: Diagnosis not present

## 2021-07-24 DIAGNOSIS — I2510 Atherosclerotic heart disease of native coronary artery without angina pectoris: Secondary | ICD-10-CM | POA: Diagnosis not present

## 2021-07-24 DIAGNOSIS — I10 Essential (primary) hypertension: Secondary | ICD-10-CM | POA: Diagnosis not present

## 2021-07-28 DIAGNOSIS — I2510 Atherosclerotic heart disease of native coronary artery without angina pectoris: Secondary | ICD-10-CM | POA: Diagnosis not present

## 2021-07-28 DIAGNOSIS — D631 Anemia in chronic kidney disease: Secondary | ICD-10-CM | POA: Diagnosis not present

## 2021-07-28 DIAGNOSIS — I130 Hypertensive heart and chronic kidney disease with heart failure and stage 1 through stage 4 chronic kidney disease, or unspecified chronic kidney disease: Secondary | ICD-10-CM | POA: Diagnosis not present

## 2021-07-28 DIAGNOSIS — I5020 Unspecified systolic (congestive) heart failure: Secondary | ICD-10-CM | POA: Diagnosis not present

## 2021-07-28 DIAGNOSIS — N1832 Chronic kidney disease, stage 3b: Secondary | ICD-10-CM | POA: Diagnosis not present

## 2021-07-28 DIAGNOSIS — N179 Acute kidney failure, unspecified: Secondary | ICD-10-CM | POA: Diagnosis not present

## 2021-08-01 DIAGNOSIS — I2510 Atherosclerotic heart disease of native coronary artery without angina pectoris: Secondary | ICD-10-CM | POA: Diagnosis not present

## 2021-08-01 DIAGNOSIS — H9190 Unspecified hearing loss, unspecified ear: Secondary | ICD-10-CM | POA: Diagnosis not present

## 2021-08-01 DIAGNOSIS — M8580 Other specified disorders of bone density and structure, unspecified site: Secondary | ICD-10-CM | POA: Diagnosis not present

## 2021-08-01 DIAGNOSIS — G4733 Obstructive sleep apnea (adult) (pediatric): Secondary | ICD-10-CM | POA: Diagnosis not present

## 2021-08-01 DIAGNOSIS — M419 Scoliosis, unspecified: Secondary | ICD-10-CM | POA: Diagnosis not present

## 2021-08-01 DIAGNOSIS — Z7982 Long term (current) use of aspirin: Secondary | ICD-10-CM | POA: Diagnosis not present

## 2021-08-01 DIAGNOSIS — I5020 Unspecified systolic (congestive) heart failure: Secondary | ICD-10-CM | POA: Diagnosis not present

## 2021-08-01 DIAGNOSIS — I130 Hypertensive heart and chronic kidney disease with heart failure and stage 1 through stage 4 chronic kidney disease, or unspecified chronic kidney disease: Secondary | ICD-10-CM | POA: Diagnosis not present

## 2021-08-01 DIAGNOSIS — E785 Hyperlipidemia, unspecified: Secondary | ICD-10-CM | POA: Diagnosis not present

## 2021-08-01 DIAGNOSIS — N1832 Chronic kidney disease, stage 3b: Secondary | ICD-10-CM | POA: Diagnosis not present

## 2021-08-01 DIAGNOSIS — D631 Anemia in chronic kidney disease: Secondary | ICD-10-CM | POA: Diagnosis not present

## 2021-08-01 DIAGNOSIS — M1990 Unspecified osteoarthritis, unspecified site: Secondary | ICD-10-CM | POA: Diagnosis not present

## 2021-08-01 DIAGNOSIS — M5136 Other intervertebral disc degeneration, lumbar region: Secondary | ICD-10-CM | POA: Diagnosis not present

## 2021-08-02 DIAGNOSIS — G4733 Obstructive sleep apnea (adult) (pediatric): Secondary | ICD-10-CM | POA: Diagnosis not present

## 2021-08-02 DIAGNOSIS — I2510 Atherosclerotic heart disease of native coronary artery without angina pectoris: Secondary | ICD-10-CM | POA: Diagnosis not present

## 2021-08-02 DIAGNOSIS — I5020 Unspecified systolic (congestive) heart failure: Secondary | ICD-10-CM | POA: Diagnosis not present

## 2021-08-02 DIAGNOSIS — M47816 Spondylosis without myelopathy or radiculopathy, lumbar region: Secondary | ICD-10-CM | POA: Diagnosis not present

## 2021-08-02 DIAGNOSIS — D631 Anemia in chronic kidney disease: Secondary | ICD-10-CM | POA: Diagnosis not present

## 2021-08-02 DIAGNOSIS — I130 Hypertensive heart and chronic kidney disease with heart failure and stage 1 through stage 4 chronic kidney disease, or unspecified chronic kidney disease: Secondary | ICD-10-CM | POA: Diagnosis not present

## 2021-08-02 DIAGNOSIS — N1832 Chronic kidney disease, stage 3b: Secondary | ICD-10-CM | POA: Diagnosis not present

## 2021-08-04 ENCOUNTER — Other Ambulatory Visit: Payer: Self-pay

## 2021-08-04 ENCOUNTER — Ambulatory Visit (INDEPENDENT_AMBULATORY_CARE_PROVIDER_SITE_OTHER): Payer: Medicare Other | Admitting: Physician Assistant

## 2021-08-04 ENCOUNTER — Encounter: Payer: Self-pay | Admitting: Physician Assistant

## 2021-08-04 VITALS — BP 132/68 | HR 74 | Ht 63.0 in | Wt 99.6 lb

## 2021-08-04 DIAGNOSIS — I5022 Chronic systolic (congestive) heart failure: Secondary | ICD-10-CM

## 2021-08-04 DIAGNOSIS — I10 Essential (primary) hypertension: Secondary | ICD-10-CM | POA: Diagnosis not present

## 2021-08-04 DIAGNOSIS — I2510 Atherosclerotic heart disease of native coronary artery without angina pectoris: Secondary | ICD-10-CM

## 2021-08-04 DIAGNOSIS — I340 Nonrheumatic mitral (valve) insufficiency: Secondary | ICD-10-CM

## 2021-08-04 DIAGNOSIS — E785 Hyperlipidemia, unspecified: Secondary | ICD-10-CM

## 2021-08-04 NOTE — Patient Instructions (Signed)
Medication Instructions:  No Changes *If you need a refill on your cardiac medications before your next appointment, please call your pharmacy*   Lab Work: No Labs If you have labs (blood work) drawn today and your tests are completely normal, you will receive your results only by: [place] (if you have MyChart) OR A paper copy in the mail If you have any lab test that is abnormal or we need to change your treatment, we will call you to review the results.   Testing/Procedures: No Testing   Follow-Up: At [facility], you and your health needs are our priority.  As part of our continuing mission to provide you with exceptional heart care, we have created designated Provider Care Teams.  These Care Teams include your primary Cardiologist (physician) and Advanced Practice Providers (APPs -  Physician Assistants and Nurse Practitioners) who all work together to provide you with the care you need, when you need it.  We recommend signing up for the patient portal called "MyChart".  Sign up information is provided on this After Visit Summary.  MyChart is used to connect with patients for Virtual Visits (Telemedicine).  Patients are able to view lab/test results, encounter notes, upcoming appointments, etc.  Non-urgent messages can be sent to your provider as well.   To learn more about what you can do with MyChart, go to [email or web address].    Your next appointment:   6 month(s)  The format for your next appointment:   In Person  Provider:   You may see Peter Martinique, MD or Caron Presume, PA-C

## 2021-08-04 NOTE — Progress Notes (Addendum)
Cardiology Office Note:    Date:  08/04/2021   ID:  Paige Cook, DOB [birth year], MRN [id number]  PCP:  Burnard Bunting, MD Referring MD: Burnard Bunting, [facility]  Cardiologist:  Peter Martinique, MD   Reason for visit: 63-month follow-up  History of Present Illness:    Paige Cook is a 87 y.o. female with a hx of nonischemic cardiomyopathy, PACs, CAD, hyperlipidemia, and OSA on CPAP.  Cardiac catheterization in 2011 showed EF 30%, 40 to 50% proximal LAD lesion, 30% mid LAD lesion, otherwise normal left circumflex, and left main and RCA.  She has history of very frequent PACs on the Holter monitor however no atrial fibrillation was identified.    She had a drop in EF to 20% with moderate MR on echo in 2020.  Patient was noted to be noncompliant with CPAP. She was seen by Dr. Caryl Comes who felt she was not a candidate for prophylactic ICD due to age and comorbidity.  Titration of medication was limited by CKD and her low BP.  She last saw Dr. Martinique in February 2022.  She is on Lasix 40 mg 1/2 tablet daily with extra tablet as needed and was doing well.  Today she comes in with her daughter.  She is doing well from a heart failure standpoint.  She denies PND, orthopnea and significant lower extremity edema.  Her weight has been overall stable with dry weight between 98 to 103 pounds.  She did lose a couple pounds recently after dealing with her second bout of C. difficile.  She just finished her antibiotics for this on Monday.  She was taken off Lasix temporarily secondary to dehydration.  She developed ankle edema and now she is back on her regular Lasix 40 mg 1/2 tablet daily.  She infrequently takes another half tablet for leg swelling.  She denies chest pain, shortness of breath, lightheadedness, syncope and bleeding.  Patient lives alone.  She has 2 daughters locally.  One of them is there every day.  Her daughter cooks for her.  The patient walks half mile  every day to the park.  She is compliant with her CPAP.  Past Medical History:  Diagnosis Date   Allergic rhinitis    Anemia    Arthritis    Bladder cancer ([place])    giant tumor   CAD (coronary artery disease)    Cardiac arrhythmia    benign   Carotid bruit    left   CHF (congestive heart failure) ([place])    CHF with systolic dysfuntion ; dilated nonishemic cardiomyopathy   Colon cancer ([place]) 1989   Colon polyps    Diverticulosis    Epistaxis    Female bladder prolapse    Gallstones    Heart failure (H[facility])    Hypercholesteremia    Hyponatremia    Probably secondary to her CHF   OSA (obstructive sleep apnea) 04/29/2012   SOB (shortness of breath)    Sometimes at night    Past Surgical History:  Procedure Laterality Date   ABDOMINAL HYSTERECTOMY     BLADDER SURGERY  2013   cancer removed   bladder tact     CARDIAC CATHETERIZATION     non obstruction CAD   CHOLECYSTECTOMY     COLECTOMY     sigmoid 1989 cancer   COLONOSCOPY      Current Medications: Current Meds  Medication Sig   aspirin 81 MG chewable tablet Chew 81  mg by mouth daily with breakfast.   carvedilol (COREG) 6.25 MG tablet TAKE 1 TABLET BY MOUTH TWICE A DAY WITH MEALS   Cholecalciferol (VITAMIN D3) 50 MCG (2000 UT) TABS Take 50 mcg by mouth.   furosemide (LASIX) 40 MG tablet Take 1/2 tablet 20 mg daily   hydrALAZINE (APRESOLINE) 10 MG tablet TAKE 1 TABLET BY MOUTH THREE TIMES A DAY (Patient taking differently: Take 5 mg by mouth 3 (three) times daily.)   isosorbide mononitrate (IMDUR) 30 MG 24 hr tablet TAKE 1 TABLET BY MOUTH EVERY DAY   vitamin B-12 (CYANOCOBALAMIN) 250 MCG tablet Take 250 mcg by mouth daily.   vitamin C (ASCORBIC ACID) 250 MG tablet Take 500 mg by mouth daily with breakfast.      Allergies:   Penicillins and Tramadol   Social History   Socioeconomic History   Marital status: Married    Spouse name: Not on file   Number of children: 4   Years of education: Not on file   Highest  education level: Not on file  Occupational History   Occupation: retired Paramedic  Tobacco Use   Smoking status: Never   Smokeless tobacco: Never  Substance and Sexual Activity   Alcohol use: No   Drug use: No   Sexual activity: Not Currently  Other Topics Concern   Not on file  Social History Narrative   Married, 1 son and 3 daughters.    One coffee a day no alcohol no tobacco   05/05/2015   Social Determinants of Health   Financial Resource Strain: Not on file  Food Insecurity: Not on file  Transportation Needs: Not on file  Physical Activity: Not on file  Stress: Not on file  Social Connections: Not on file     Family History: The patient's family history includes Heart attack in her father and mother; Heart disease in her father; Heart failure (age of onset: 89) in her sister. There is no history of Stroke.  ROS:   Please see the history of present illness.     EKGs/Labs/Other Studies Reviewed:    Recent Labs: 05/22/2021: ALT 9 05/25/2021: BUN 66; Creatinine, Ser 1.97; Hemoglobin 8.0; Platelets 143; Potassium 3.7; Sodium 138  Recent Lipid Panel    Component Value Date/Time   CHOL 164 01/28/2020 0940   TRIG 64 01/28/2020 0940   HDL 60 01/28/2020 0940   CHOLHDL 2.7 01/28/2020 0940   CHOLHDL 2.2 11/03/2015 1042   VLDL 15 11/03/2015 1042   LDLCALC 91 01/28/2020 0940   LDLDIRECT 152.9 03/06/2013 0924    Physical Exam:    VS:  BP 132/68   Pulse 74   Ht 5\' 3"  (1.6 m)   Wt 99 lb 9.6 oz (45.2 kg)   BMI 17.64 kg/m     Wt Readings from Last 3 Encounters:  08/04/21 99 lb 9.6 oz (45.2 kg)  05/22/21 103 lb (46.7 kg)  01/17/21 103 lb 9.6 oz (47 kg)     GEN: Well nourished, well developed in no acute distress, elderly HEENT: Normal NECK: No JVD CARDIAC: regular rhythm with ectopics, systolic murmur RESPIRATORY:  Clear to auscultation without rales, wheezing or rhonchi  ABDOMEN: Soft, non-tender, non-distended MUSCULOSKELETAL: No edema; No deformity   SKIN: Warm and dry NEUROLOGIC:  Alert and oriented PSYCHIATRIC:  Normal affect   ASSESSMENT AND PLAN   Chronic systolic heart failure, euvolemic -Continue Coreg, hydralazine and Imdur.   - With blood pressures dipping to the 110s and recent C. difficile,  will not try titrate her medications today. -She is not on an ARB/ACE or spironolactone secondary to CKD. -As she is euvolemic, I do not see reason to repeat a 2D echo. -We reviewed signs of heart failure and when to call the office and when to call 911.  Moderate MR - Moderate MR on echo 2020 - euvolemic  Nonobstructive coronary artery disease - no angina. - Continue aspirin and beta-blocker.  Hyperlipidemia - LDL 92 in July 2021.  Her daughter cooks a heart healthy diet.  At her age, I do not feel inclined to push statin therapy.  Disposition - Follow-up in 6 months with Dr. Martinique or myself.    Medication Adjustments/Labs and Tests Ordered: Current medicines are reviewed at length with the patient today.  Concerns regarding medicines are outlined above.  No orders of the defined types were placed in this encounter.  No orders of the defined types were placed in this encounter.   Patient Instructions  Medication Instructions:  No Changes *If you need a refill on your cardiac medications before your next appointment, please call your pharmacy*   Lab Work: No Labs If you have labs (blood work) drawn today and your tests are completely normal, you will receive your results only by: [place] (if you have MyChart) OR A paper copy in the mail If you have any lab test that is abnormal or we need to change your treatment, we will call you to review the results.   Testing/Procedures: No Testing   Follow-Up: At [facility], you and your health needs are our priority.  As part of our continuing mission to provide you with exceptional heart care, we have created designated Provider Care Teams.  These Care Teams  include your primary Cardiologist (physician) and Advanced Practice Providers (APPs -  Physician Assistants and Nurse Practitioners) who all work together to provide you with the care you need, when you need it.  We recommend signing up for the patient portal called "MyChart".  Sign up information is provided on this After Visit Summary.  MyChart is used to connect with patients for Virtual Visits (Telemedicine).  Patients are able to view lab/test results, encounter notes, upcoming appointments, etc.  Non-urgent messages can be sent to your provider as well.   To learn more about what you can do with MyChart, go to [email or web address].    Your next appointment:   6 month(s)  The format for your next appointment:   In Person  Provider:   You may see Peter Martinique, MD or Caron Presume, PA-C      Signed, Warren Lacy, PA-C  08/04/2021 12:20 PM    [facility]

## 2021-08-10 DIAGNOSIS — I5020 Unspecified systolic (congestive) heart failure: Secondary | ICD-10-CM | POA: Diagnosis not present

## 2021-08-10 DIAGNOSIS — N1832 Chronic kidney disease, stage 3b: Secondary | ICD-10-CM | POA: Diagnosis not present

## 2021-08-10 DIAGNOSIS — D631 Anemia in chronic kidney disease: Secondary | ICD-10-CM | POA: Diagnosis not present

## 2021-08-10 DIAGNOSIS — G4733 Obstructive sleep apnea (adult) (pediatric): Secondary | ICD-10-CM | POA: Diagnosis not present

## 2021-08-10 DIAGNOSIS — I130 Hypertensive heart and chronic kidney disease with heart failure and stage 1 through stage 4 chronic kidney disease, or unspecified chronic kidney disease: Secondary | ICD-10-CM | POA: Diagnosis not present

## 2021-08-10 DIAGNOSIS — I2510 Atherosclerotic heart disease of native coronary artery without angina pectoris: Secondary | ICD-10-CM | POA: Diagnosis not present

## 2021-08-18 ENCOUNTER — Encounter: Payer: Self-pay | Admitting: Internal Medicine

## 2021-08-23 DIAGNOSIS — N1832 Chronic kidney disease, stage 3b: Secondary | ICD-10-CM | POA: Diagnosis not present

## 2021-08-23 DIAGNOSIS — D631 Anemia in chronic kidney disease: Secondary | ICD-10-CM | POA: Diagnosis not present

## 2021-08-23 DIAGNOSIS — I5020 Unspecified systolic (congestive) heart failure: Secondary | ICD-10-CM | POA: Diagnosis not present

## 2021-08-23 DIAGNOSIS — I130 Hypertensive heart and chronic kidney disease with heart failure and stage 1 through stage 4 chronic kidney disease, or unspecified chronic kidney disease: Secondary | ICD-10-CM | POA: Diagnosis not present

## 2021-08-23 DIAGNOSIS — G4733 Obstructive sleep apnea (adult) (pediatric): Secondary | ICD-10-CM | POA: Diagnosis not present

## 2021-08-23 DIAGNOSIS — I2510 Atherosclerotic heart disease of native coronary artery without angina pectoris: Secondary | ICD-10-CM | POA: Diagnosis not present

## 2021-08-28 DIAGNOSIS — A0472 Enterocolitis due to Clostridium difficile, not specified as recurrent: Secondary | ICD-10-CM | POA: Diagnosis not present

## 2021-08-29 DIAGNOSIS — D631 Anemia in chronic kidney disease: Secondary | ICD-10-CM | POA: Diagnosis not present

## 2021-08-29 DIAGNOSIS — I2510 Atherosclerotic heart disease of native coronary artery without angina pectoris: Secondary | ICD-10-CM | POA: Diagnosis not present

## 2021-08-29 DIAGNOSIS — G4733 Obstructive sleep apnea (adult) (pediatric): Secondary | ICD-10-CM | POA: Diagnosis not present

## 2021-08-29 DIAGNOSIS — N1832 Chronic kidney disease, stage 3b: Secondary | ICD-10-CM | POA: Diagnosis not present

## 2021-08-29 DIAGNOSIS — M47816 Spondylosis without myelopathy or radiculopathy, lumbar region: Secondary | ICD-10-CM | POA: Diagnosis not present

## 2021-08-29 DIAGNOSIS — I130 Hypertensive heart and chronic kidney disease with heart failure and stage 1 through stage 4 chronic kidney disease, or unspecified chronic kidney disease: Secondary | ICD-10-CM | POA: Diagnosis not present

## 2021-08-29 DIAGNOSIS — I5020 Unspecified systolic (congestive) heart failure: Secondary | ICD-10-CM | POA: Diagnosis not present

## 2021-08-31 DIAGNOSIS — E785 Hyperlipidemia, unspecified: Secondary | ICD-10-CM | POA: Diagnosis not present

## 2021-08-31 DIAGNOSIS — I5020 Unspecified systolic (congestive) heart failure: Secondary | ICD-10-CM | POA: Diagnosis not present

## 2021-08-31 DIAGNOSIS — M5136 Other intervertebral disc degeneration, lumbar region: Secondary | ICD-10-CM | POA: Diagnosis not present

## 2021-08-31 DIAGNOSIS — Z7982 Long term (current) use of aspirin: Secondary | ICD-10-CM | POA: Diagnosis not present

## 2021-08-31 DIAGNOSIS — I130 Hypertensive heart and chronic kidney disease with heart failure and stage 1 through stage 4 chronic kidney disease, or unspecified chronic kidney disease: Secondary | ICD-10-CM | POA: Diagnosis not present

## 2021-08-31 DIAGNOSIS — D631 Anemia in chronic kidney disease: Secondary | ICD-10-CM | POA: Diagnosis not present

## 2021-08-31 DIAGNOSIS — M8580 Other specified disorders of bone density and structure, unspecified site: Secondary | ICD-10-CM | POA: Diagnosis not present

## 2021-08-31 DIAGNOSIS — I2510 Atherosclerotic heart disease of native coronary artery without angina pectoris: Secondary | ICD-10-CM | POA: Diagnosis not present

## 2021-08-31 DIAGNOSIS — M419 Scoliosis, unspecified: Secondary | ICD-10-CM | POA: Diagnosis not present

## 2021-08-31 DIAGNOSIS — H9190 Unspecified hearing loss, unspecified ear: Secondary | ICD-10-CM | POA: Diagnosis not present

## 2021-08-31 DIAGNOSIS — G4733 Obstructive sleep apnea (adult) (pediatric): Secondary | ICD-10-CM | POA: Diagnosis not present

## 2021-08-31 DIAGNOSIS — M1990 Unspecified osteoarthritis, unspecified site: Secondary | ICD-10-CM | POA: Diagnosis not present

## 2021-08-31 DIAGNOSIS — N1832 Chronic kidney disease, stage 3b: Secondary | ICD-10-CM | POA: Diagnosis not present

## 2021-09-06 DIAGNOSIS — I2510 Atherosclerotic heart disease of native coronary artery without angina pectoris: Secondary | ICD-10-CM | POA: Diagnosis not present

## 2021-09-06 DIAGNOSIS — G4733 Obstructive sleep apnea (adult) (pediatric): Secondary | ICD-10-CM | POA: Diagnosis not present

## 2021-09-06 DIAGNOSIS — I5020 Unspecified systolic (congestive) heart failure: Secondary | ICD-10-CM | POA: Diagnosis not present

## 2021-09-06 DIAGNOSIS — I130 Hypertensive heart and chronic kidney disease with heart failure and stage 1 through stage 4 chronic kidney disease, or unspecified chronic kidney disease: Secondary | ICD-10-CM | POA: Diagnosis not present

## 2021-09-06 DIAGNOSIS — N1832 Chronic kidney disease, stage 3b: Secondary | ICD-10-CM | POA: Diagnosis not present

## 2021-09-06 DIAGNOSIS — D631 Anemia in chronic kidney disease: Secondary | ICD-10-CM | POA: Diagnosis not present

## 2021-09-11 DIAGNOSIS — I130 Hypertensive heart and chronic kidney disease with heart failure and stage 1 through stage 4 chronic kidney disease, or unspecified chronic kidney disease: Secondary | ICD-10-CM | POA: Diagnosis not present

## 2021-09-11 DIAGNOSIS — D631 Anemia in chronic kidney disease: Secondary | ICD-10-CM | POA: Diagnosis not present

## 2021-09-11 DIAGNOSIS — I2510 Atherosclerotic heart disease of native coronary artery without angina pectoris: Secondary | ICD-10-CM | POA: Diagnosis not present

## 2021-09-11 DIAGNOSIS — I5020 Unspecified systolic (congestive) heart failure: Secondary | ICD-10-CM | POA: Diagnosis not present

## 2021-09-11 DIAGNOSIS — G4733 Obstructive sleep apnea (adult) (pediatric): Secondary | ICD-10-CM | POA: Diagnosis not present

## 2021-09-11 DIAGNOSIS — N1832 Chronic kidney disease, stage 3b: Secondary | ICD-10-CM | POA: Diagnosis not present

## 2021-09-12 DIAGNOSIS — M47816 Spondylosis without myelopathy or radiculopathy, lumbar region: Secondary | ICD-10-CM | POA: Diagnosis not present

## 2021-09-20 DIAGNOSIS — I5020 Unspecified systolic (congestive) heart failure: Secondary | ICD-10-CM | POA: Diagnosis not present

## 2021-09-20 DIAGNOSIS — D631 Anemia in chronic kidney disease: Secondary | ICD-10-CM | POA: Diagnosis not present

## 2021-09-20 DIAGNOSIS — I2510 Atherosclerotic heart disease of native coronary artery without angina pectoris: Secondary | ICD-10-CM | POA: Diagnosis not present

## 2021-09-20 DIAGNOSIS — N1832 Chronic kidney disease, stage 3b: Secondary | ICD-10-CM | POA: Diagnosis not present

## 2021-09-20 DIAGNOSIS — I130 Hypertensive heart and chronic kidney disease with heart failure and stage 1 through stage 4 chronic kidney disease, or unspecified chronic kidney disease: Secondary | ICD-10-CM | POA: Diagnosis not present

## 2021-09-20 DIAGNOSIS — G4733 Obstructive sleep apnea (adult) (pediatric): Secondary | ICD-10-CM | POA: Diagnosis not present

## 2021-09-28 DIAGNOSIS — G4733 Obstructive sleep apnea (adult) (pediatric): Secondary | ICD-10-CM | POA: Diagnosis not present

## 2021-09-28 DIAGNOSIS — I5020 Unspecified systolic (congestive) heart failure: Secondary | ICD-10-CM | POA: Diagnosis not present

## 2021-09-28 DIAGNOSIS — I2510 Atherosclerotic heart disease of native coronary artery without angina pectoris: Secondary | ICD-10-CM | POA: Diagnosis not present

## 2021-09-28 DIAGNOSIS — I130 Hypertensive heart and chronic kidney disease with heart failure and stage 1 through stage 4 chronic kidney disease, or unspecified chronic kidney disease: Secondary | ICD-10-CM | POA: Diagnosis not present

## 2021-09-28 DIAGNOSIS — N1832 Chronic kidney disease, stage 3b: Secondary | ICD-10-CM | POA: Diagnosis not present

## 2021-09-28 DIAGNOSIS — D631 Anemia in chronic kidney disease: Secondary | ICD-10-CM | POA: Diagnosis not present

## 2021-10-10 DIAGNOSIS — M47816 Spondylosis without myelopathy or radiculopathy, lumbar region: Secondary | ICD-10-CM | POA: Diagnosis not present

## 2021-10-13 ENCOUNTER — Ambulatory Visit: Payer: Medicare Other | Admitting: Internal Medicine

## 2021-11-01 DIAGNOSIS — Z23 Encounter for immunization: Secondary | ICD-10-CM | POA: Diagnosis not present

## 2021-11-08 ENCOUNTER — Other Ambulatory Visit: Payer: Self-pay | Admitting: Cardiology

## 2021-11-20 DIAGNOSIS — Z23 Encounter for immunization: Secondary | ICD-10-CM | POA: Diagnosis not present

## 2022-01-21 NOTE — Progress Notes (Signed)
Cardiology Office Note:    Date:  01/25/2022   ID:  Paige Cook, DOB Apr 08, 1928, MRN [id number]  PCP:  Burnard Bunting, MD  Cardiologist:  Mihira Tozzi [place], MD  Electrophysiologist:  None   Referring MD: Burnard Bunting, MD   Chief Complaint  Patient presents with   Follow-up    6 months.   Fatigue        Congestive Heart Failure    History of Present Illness:    Paige Cook is a 88 y.o. female with a hx of nonischemic cardiomyopathy, PACs, CAD, hyperlipidemia, and OSA on CPAP.  She is seen for follow up CHF. Previous cardiac catheterization on 01/10/2010 showed EF 30%, 40 to 50% proximal LAD lesion, 30% mid LAD lesion, otherwise normal left circumflex, and left main and RCA.  Patient had EF of 40% in 2012.  Subsequent echocardiogram in 2013 showed EF of 45%.  He has history of very frequent PACs on the Holter monitor however no atrial fibrillation was identified.    Patient was seen in February 2020 and noted to have mild edema however otherwise asymptomatic.  In August he presented with symptom of PND.  Her Lasix was increased.  Echocardiogram obtained showed decreased LVEF to 20% with moderate MR.  It was noted she was noncompliant with CPAP therapy.  Symptoms did not improve with increased Lasix.   Titration of medication was limited by CKD and her low BP.  Due to worsening renal function in August, Lasix was reduced to 3 times a week.  She later reported increased edema and shortness of breath.  Lasix was eventually increased back to daily dosing and symptoms improved.   She was admitted in June 2022 with C difficile colitis. Treated with antibiotics.  On follow up today she is doing OK. Seen with her daughter. Now on lasix 20 mg daily with extra 20 mg if needed. Weight is stable. No swelling. Breathing is doing well. Just feels "blah" and depressed.     Past Medical History:  Diagnosis Date   Allergic rhinitis    Anemia    Arthritis    Bladder cancer ([place])    giant tumor    CAD (coronary artery disease)    Cardiac arrhythmia    benign   Carotid bruit    left   CHF (congestive heart failure) ([place])    CHF with systolic dysfuntion ; dilated nonishemic cardiomyopathy   Colon cancer ([place]) 1989   Colon polyps    Diverticulosis    Epistaxis    Female bladder prolapse    Gallstones    Heart failure (HCC)    Hypercholesteremia    Hyponatremia    Probably secondary to her CHF   OSA (obstructive sleep apnea) 04/29/2012   SOB (shortness of breath)    Sometimes at night    Past Surgical History:  Procedure Laterality Date   ABDOMINAL HYSTERECTOMY     BLADDER SURGERY  2013   cancer removed   bladder tact     CARDIAC CATHETERIZATION     non obstruction CAD   CHOLECYSTECTOMY     COLECTOMY     sigmoid 1989 cancer   COLONOSCOPY      Current Medications: Current Meds  Medication Sig   aspirin 81 MG chewable tablet Chew 81 mg by mouth daily with breakfast.   carvedilol (COREG) 6.25 MG tablet TAKE 1 TABLET BY MOUTH TWICE A DAY WITH MEALS   Cholecalciferol (VITAMIN D3) 50 MCG (2000 UT) TABS Take 50  mcg by mouth.   furosemide (LASIX) 40 MG tablet Take 1/2 tablet 20 mg daily   hydrALAZINE (APRESOLINE) 10 MG tablet TAKE 1 TABLET BY MOUTH THREE TIMES A DAY (Patient taking differently: Take 5 mg by mouth 3 (three) times daily.)   saccharomyces boulardii (FLORASTOR) 250 MG capsule Take 250 mg by mouth daily.   vitamin B-12 (CYANOCOBALAMIN) 250 MCG tablet Take 250 mcg by mouth daily.   vitamin C (ASCORBIC ACID) 250 MG tablet Take 500 mg by mouth daily with breakfast.    [DISCONTINUED] isosorbide mononitrate (IMDUR) 30 MG 24 hr tablet TAKE 1 TABLET BY MOUTH EVERY DAY     Allergies:   Penicillins and Tramadol   Social History   Socioeconomic History   Marital status: Married    Spouse name: Not on file   Number of children: 4   Years of education: Not on file   Highest education level: Not on file  Occupational History   Occupation: retired Warehouse manager  Tobacco Use   Smoking status: Never   Smokeless tobacco: Never  Substance and Sexual Activity   Alcohol use: No   Drug use: No   Sexual activity: Not Currently  Other Topics Concern   Not on file  Social History Narrative   Married, 1 son and 3 daughters.    One coffee a day no alcohol no tobacco   05/05/2015   Social Determinants of Health   Financial Resource Strain: Not on file  Food Insecurity: Not on file  Transportation Needs: Not on file  Physical Activity: Not on file  Stress: Not on file  Social Connections: Not on file     Family History: The patient's family history includes Heart attack in her father and mother; Heart disease in her father; Heart failure (age of onset: 31) in her sister. There is no history of Stroke.  ROS:   Please see the history of present illness.     All other systems reviewed and are negative.  EKGs/Labs/Other Studies Reviewed:    The following studies were reviewed today:  Cath 01/10/2010      Echo 07/30/2019 IMPRESSIONS      1. The left ventricle has a visually estimated ejection fraction of 20%. The cavity size was severely dilated. Left ventricular diastolic Doppler parameters are consistent with restrictive filling. Elevated left ventricular end-diastolic pressure Left  ventrical global hypokinesis without regional wall motion abnormalities.  2. The right ventricle has normal systolic function. The cavity was normal. There is no increase in right ventricular wall thickness.  3. Left atrial size was moderately dilated.  4. Right atrial size was mildly dilated.  5. Moderate thickening of the mitral valve leaflet. Mild calcification of the mitral valve leaflet. There is mild mitral annular calcification present. Mitral valve regurgitation is moderate by color flow Doppler.  6. The aortic valve is tricuspid. Moderate thickening of the aortic valve. Sclerosis without any evidence of stenosis of the aortic valve. Aortic valve  regurgitation is mild by color flow Doppler.  7. The aorta is normal unless otherwise noted.  EKG:  EKG is done today. NSR with occ PAC. Rate 72. Nonspecific ST abnormality. I have personally reviewed and interpreted this study.   Recent Labs: 05/22/2021: ALT 9 05/25/2021: BUN 66; Creatinine, Ser 1.97; Hemoglobin 8.0; Platelets 143; Potassium 3.7; Sodium 138  Recent Lipid Panel    Component Value Date/Time   CHOL 164 01/28/2020 0940   TRIG 64 01/28/2020 0940   HDL 60 01/28/2020 0940  CHOLHDL 2.7 01/28/2020 0940   CHOLHDL 2.2 11/03/2015 1042   VLDL 15 11/03/2015 1042   LDLCALC 91 01/28/2020 0940   LDLDIRECT 152.9 03/06/2013 0924   Dated 06/22/19: cholesterol 178, triglycerides 51, HDL 64, LDL 104. LFTs normal. Hgb 11. TSH normal Dated 12/24/19: creatinine 1.89. potassium 5.2.  Dated 06/27/20: BUN 88, creatinine 1.6. cholesterol 177, triglycerides 73, HDL 70, LDL 92. Hgb 10.8. plt 177K. TSH normal. Dated 07/20/21: cholesterol 175, triglycerides 64, HDL 52, LDL 110. BUN 94, creatinine 1.97. otherwise CMET normal.  Physical Exam:    VS:  BP (!) 94/48 (BP Location: Left Arm, Patient Position: Sitting, Cuff Size: Normal)    Pulse 72    Ht 5\' 3"  (1.6 m)    Wt 100 lb (45.4 kg)    BMI 17.71 kg/m     Wt Readings from Last 3 Encounters:  01/25/22 100 lb (45.4 kg)  08/04/21 99 lb 9.6 oz (45.2 kg)  05/22/21 103 lb (46.7 kg)     GEN:  Elderly WF  in no acute distress HEENT: Normal NECK: no JVD; No carotid bruits Back: kyphoscoliosis CARDIAC: RRR, no murmurs, rubs, gallops RESPIRATORY:  Clear to auscultation without rales, wheezing or rhonchi  ABDOMEN: Soft, non-tender, non-distended MUSCULOSKELETAL:  No ankle edema; marked scoliosis SKIN: Warm and dry NEUROLOGIC:  Alert and oriented x 3 PSYCHIATRIC:  Normal affect   ASSESSMENT:    1. Chronic systolic HF (heart failure) ([employer])   2. Primary hypertension   3. Essential hypertension     PLAN:    In order of problems listed  above:  Chronic systolic heart failure: she is doing well on lasix 20 mg daily. Not a candidate for ACEi/ARV,ARNI, or spironolactone due to CKD. I am concerned that her BP is too low. Will stop Imdur. Check BMET today. If BP remains low may need to stop hydralazine.   Nonischemic cardiomyopathy: Previous echocardiogram in March 2020 showed worsening EF of 20%.  Poor long-term prognosis.  CAD: No chest pain  Hyperlipidemia: LDL 9110  5.   CKD stage 3b. Will update BMET   Medication Adjustments/Labs and Tests Ordered: Current medicines are reviewed at length with the patient today.  Concerns regarding medicines are outlined above.  No orders of the defined types were placed in this encounter.  No orders of the defined types were placed in this encounter.   Patient Instructions  Stop taking Imdur  We will check your kidney function.   Signed, Christean Silvestri [place], MD  01/25/2022 1:35 PM    [facility]

## 2022-01-23 DIAGNOSIS — A0472 Enterocolitis due to Clostridium difficile, not specified as recurrent: Secondary | ICD-10-CM | POA: Diagnosis not present

## 2022-01-23 DIAGNOSIS — Z9049 Acquired absence of other specified parts of digestive tract: Secondary | ICD-10-CM | POA: Diagnosis not present

## 2022-01-23 DIAGNOSIS — A0471 Enterocolitis due to Clostridium difficile, recurrent: Secondary | ICD-10-CM | POA: Diagnosis not present

## 2022-01-23 DIAGNOSIS — Z85038 Personal history of other malignant neoplasm of large intestine: Secondary | ICD-10-CM | POA: Diagnosis not present

## 2022-01-25 ENCOUNTER — Encounter: Payer: Self-pay | Admitting: Cardiology

## 2022-01-25 ENCOUNTER — Other Ambulatory Visit: Payer: Self-pay

## 2022-01-25 ENCOUNTER — Ambulatory Visit (INDEPENDENT_AMBULATORY_CARE_PROVIDER_SITE_OTHER): Payer: Medicare Other | Admitting: Cardiology

## 2022-01-25 VITALS — BP 94/48 | HR 72 | Ht 63.0 in | Wt 100.0 lb

## 2022-01-25 DIAGNOSIS — I10 Essential (primary) hypertension: Secondary | ICD-10-CM | POA: Diagnosis not present

## 2022-01-25 DIAGNOSIS — I5022 Chronic systolic (congestive) heart failure: Secondary | ICD-10-CM | POA: Diagnosis not present

## 2022-01-25 NOTE — Patient Instructions (Addendum)
Stop taking Imdur  We will check your kidney function.

## 2022-01-29 DIAGNOSIS — M419 Scoliosis, unspecified: Secondary | ICD-10-CM | POA: Diagnosis not present

## 2022-01-29 DIAGNOSIS — R5383 Other fatigue: Secondary | ICD-10-CM | POA: Diagnosis not present

## 2022-01-29 DIAGNOSIS — I2510 Atherosclerotic heart disease of native coronary artery without angina pectoris: Secondary | ICD-10-CM | POA: Diagnosis not present

## 2022-01-29 DIAGNOSIS — N1832 Chronic kidney disease, stage 3b: Secondary | ICD-10-CM | POA: Diagnosis not present

## 2022-01-29 DIAGNOSIS — I509 Heart failure, unspecified: Secondary | ICD-10-CM | POA: Diagnosis not present

## 2022-01-29 DIAGNOSIS — R5381 Other malaise: Secondary | ICD-10-CM | POA: Diagnosis not present

## 2022-01-29 DIAGNOSIS — I130 Hypertensive heart and chronic kidney disease with heart failure and stage 1 through stage 4 chronic kidney disease, or unspecified chronic kidney disease: Secondary | ICD-10-CM | POA: Diagnosis not present

## 2022-01-30 ENCOUNTER — Other Ambulatory Visit: Payer: Self-pay

## 2022-01-30 ENCOUNTER — Encounter: Payer: Self-pay | Admitting: Cardiology

## 2022-01-30 NOTE — Telephone Encounter (Signed)
Spoke to patient's daughter Anderson Malta Dr.Jordan's advice given.

## 2022-01-30 NOTE — Telephone Encounter (Signed)
I would recommend stopping lasix. Only use this PRN in the future. I would just hydrate for now and see how BP looks on repeat.  Lameshia Hypolite Martin[place] MD, [facility]

## 2022-02-01 DIAGNOSIS — D509 Iron deficiency anemia, unspecified: Secondary | ICD-10-CM | POA: Diagnosis not present

## 2022-02-01 DIAGNOSIS — I509 Heart failure, unspecified: Secondary | ICD-10-CM | POA: Diagnosis not present

## 2022-02-01 DIAGNOSIS — I130 Hypertensive heart and chronic kidney disease with heart failure and stage 1 through stage 4 chronic kidney disease, or unspecified chronic kidney disease: Secondary | ICD-10-CM | POA: Diagnosis not present

## 2022-02-01 DIAGNOSIS — R5381 Other malaise: Secondary | ICD-10-CM | POA: Diagnosis not present

## 2022-02-01 DIAGNOSIS — R7989 Other specified abnormal findings of blood chemistry: Secondary | ICD-10-CM | POA: Diagnosis not present

## 2022-02-01 DIAGNOSIS — R5383 Other fatigue: Secondary | ICD-10-CM | POA: Diagnosis not present

## 2022-02-01 DIAGNOSIS — E871 Hypo-osmolality and hyponatremia: Secondary | ICD-10-CM | POA: Diagnosis not present

## 2022-02-01 DIAGNOSIS — D638 Anemia in other chronic diseases classified elsewhere: Secondary | ICD-10-CM | POA: Diagnosis not present

## 2022-02-01 DIAGNOSIS — N1832 Chronic kidney disease, stage 3b: Secondary | ICD-10-CM | POA: Diagnosis not present

## 2022-02-05 ENCOUNTER — Other Ambulatory Visit (HOSPITAL_COMMUNITY): Payer: Self-pay

## 2022-02-06 ENCOUNTER — Other Ambulatory Visit: Payer: Self-pay | Admitting: Cardiology

## 2022-02-06 ENCOUNTER — Ambulatory Visit (HOSPITAL_COMMUNITY)
Admission: RE | Admit: 2022-02-06 | Discharge: 2022-02-06 | Disposition: A | Discharge status: Home / Self Care | Payer: Medicare Other | Source: Ambulatory Visit | Attending: Internal Medicine | Admitting: Internal Medicine

## 2022-02-06 ENCOUNTER — Other Ambulatory Visit: Payer: Self-pay

## 2022-02-06 DIAGNOSIS — D649 Anemia, unspecified: Secondary | ICD-10-CM | POA: Insufficient documentation

## 2022-02-06 LAB — ABO/RH: ABO/RH(D): O NEG

## 2022-02-06 LAB — PREPARE RBC (CROSSMATCH)

## 2022-02-06 MED ORDER — FUROSEMIDE 10 MG/ML IJ SOLN
20.0000 mg | Freq: Once | INTRAMUSCULAR | Status: AC
Start: 2022-02-06 — End: 2022-02-06
  Administered 2022-02-06: 20 mg via INTRAVENOUS

## 2022-02-06 MED ORDER — FUROSEMIDE 10 MG/ML IJ SOLN
INTRAMUSCULAR | Status: AC
  Filled 2022-02-06: qty 2

## 2022-02-06 MED ORDER — SODIUM CHLORIDE 0.9% IV SOLUTION
Freq: Once | INTRAVENOUS | Status: DC
Start: 2022-02-06 — End: 2022-02-07

## 2022-02-07 LAB — TYPE AND SCREEN
ABO/RH(D): O NEG
Antibody Screen: NEGATIVE

## 2022-02-07 LAB — BPAM RBC
Blood Product Expiration Date: 202302262359
ISSUE DATE / TIME: 202302210958
Unit Type and Rh: 9500

## 2022-02-08 DIAGNOSIS — D638 Anemia in other chronic diseases classified elsewhere: Secondary | ICD-10-CM | POA: Diagnosis not present

## 2022-02-08 DIAGNOSIS — R5383 Other fatigue: Secondary | ICD-10-CM | POA: Diagnosis not present

## 2022-02-08 DIAGNOSIS — E871 Hypo-osmolality and hyponatremia: Secondary | ICD-10-CM | POA: Diagnosis not present

## 2022-02-08 DIAGNOSIS — N1832 Chronic kidney disease, stage 3b: Secondary | ICD-10-CM | POA: Diagnosis not present

## 2022-02-08 DIAGNOSIS — R5381 Other malaise: Secondary | ICD-10-CM | POA: Diagnosis not present

## 2022-02-08 DIAGNOSIS — E611 Iron deficiency: Secondary | ICD-10-CM | POA: Diagnosis not present

## 2022-02-08 DIAGNOSIS — I509 Heart failure, unspecified: Secondary | ICD-10-CM | POA: Diagnosis not present

## 2022-02-08 DIAGNOSIS — I130 Hypertensive heart and chronic kidney disease with heart failure and stage 1 through stage 4 chronic kidney disease, or unspecified chronic kidney disease: Secondary | ICD-10-CM | POA: Diagnosis not present

## 2022-02-09 DIAGNOSIS — D638 Anemia in other chronic diseases classified elsewhere: Secondary | ICD-10-CM | POA: Diagnosis not present

## 2022-02-13 ENCOUNTER — Encounter (HOSPITAL_COMMUNITY): Payer: Medicare Other

## 2022-02-13 DIAGNOSIS — M8580 Other specified disorders of bone density and structure, unspecified site: Secondary | ICD-10-CM | POA: Diagnosis not present

## 2022-02-13 DIAGNOSIS — E785 Hyperlipidemia, unspecified: Secondary | ICD-10-CM | POA: Diagnosis not present

## 2022-02-13 DIAGNOSIS — I2510 Atherosclerotic heart disease of native coronary artery without angina pectoris: Secondary | ICD-10-CM | POA: Diagnosis not present

## 2022-02-13 DIAGNOSIS — I10 Essential (primary) hypertension: Secondary | ICD-10-CM | POA: Diagnosis not present

## 2022-02-26 ENCOUNTER — Other Ambulatory Visit (HOSPITAL_COMMUNITY): Payer: Self-pay | Admitting: *Deleted

## 2022-02-27 ENCOUNTER — Encounter (HOSPITAL_COMMUNITY)
Admission: RE | Admit: 2022-02-27 | Discharge: 2022-02-27 | Disposition: A | Discharge status: Home / Self Care | Payer: Medicare Other | Source: Ambulatory Visit | Attending: Internal Medicine | Admitting: Internal Medicine

## 2022-02-27 ENCOUNTER — Other Ambulatory Visit: Payer: Self-pay

## 2022-02-27 DIAGNOSIS — D649 Anemia, unspecified: Secondary | ICD-10-CM | POA: Insufficient documentation

## 2022-02-27 MED ORDER — SODIUM CHLORIDE 0.9 % IV SOLN
510.0000 mg | INTRAVENOUS | Status: DC
  Administered 2022-02-27: 510 mg via INTRAVENOUS
  Filled 2022-02-27: qty 510

## 2022-03-02 DIAGNOSIS — M1990 Unspecified osteoarthritis, unspecified site: Secondary | ICD-10-CM | POA: Diagnosis not present

## 2022-03-02 DIAGNOSIS — M419 Scoliosis, unspecified: Secondary | ICD-10-CM | POA: Diagnosis not present

## 2022-03-02 DIAGNOSIS — I509 Heart failure, unspecified: Secondary | ICD-10-CM | POA: Diagnosis not present

## 2022-03-02 DIAGNOSIS — N1832 Chronic kidney disease, stage 3b: Secondary | ICD-10-CM | POA: Diagnosis not present

## 2022-03-02 DIAGNOSIS — Z9181 History of falling: Secondary | ICD-10-CM | POA: Diagnosis not present

## 2022-03-02 DIAGNOSIS — I130 Hypertensive heart and chronic kidney disease with heart failure and stage 1 through stage 4 chronic kidney disease, or unspecified chronic kidney disease: Secondary | ICD-10-CM | POA: Diagnosis not present

## 2022-03-02 DIAGNOSIS — I2510 Atherosclerotic heart disease of native coronary artery without angina pectoris: Secondary | ICD-10-CM | POA: Diagnosis not present

## 2022-03-02 DIAGNOSIS — E785 Hyperlipidemia, unspecified: Secondary | ICD-10-CM | POA: Diagnosis not present

## 2022-03-05 ENCOUNTER — Ambulatory Visit (HOSPITAL_COMMUNITY): Payer: Medicare Other

## 2022-03-06 ENCOUNTER — Other Ambulatory Visit: Payer: Self-pay

## 2022-03-06 ENCOUNTER — Encounter (HOSPITAL_COMMUNITY)
Admission: RE | Admit: 2022-03-06 | Discharge: 2022-03-06 | Disposition: A | Discharge status: Home / Self Care | Payer: Medicare Other | Source: Ambulatory Visit | Attending: Internal Medicine | Admitting: Internal Medicine

## 2022-03-06 DIAGNOSIS — D649 Anemia, unspecified: Secondary | ICD-10-CM | POA: Diagnosis not present

## 2022-03-06 MED ORDER — SODIUM CHLORIDE 0.9 % IV SOLN
510.0000 mg | INTRAVENOUS | Status: AC
  Administered 2022-03-06: 510 mg via INTRAVENOUS
  Filled 2022-03-06: qty 510

## 2022-03-08 DIAGNOSIS — M419 Scoliosis, unspecified: Secondary | ICD-10-CM | POA: Diagnosis not present

## 2022-03-08 DIAGNOSIS — N1832 Chronic kidney disease, stage 3b: Secondary | ICD-10-CM | POA: Diagnosis not present

## 2022-03-08 DIAGNOSIS — I2510 Atherosclerotic heart disease of native coronary artery without angina pectoris: Secondary | ICD-10-CM | POA: Diagnosis not present

## 2022-03-08 DIAGNOSIS — I130 Hypertensive heart and chronic kidney disease with heart failure and stage 1 through stage 4 chronic kidney disease, or unspecified chronic kidney disease: Secondary | ICD-10-CM | POA: Diagnosis not present

## 2022-03-08 DIAGNOSIS — I509 Heart failure, unspecified: Secondary | ICD-10-CM | POA: Diagnosis not present

## 2022-03-08 DIAGNOSIS — E785 Hyperlipidemia, unspecified: Secondary | ICD-10-CM | POA: Diagnosis not present

## 2022-03-14 DIAGNOSIS — M419 Scoliosis, unspecified: Secondary | ICD-10-CM | POA: Diagnosis not present

## 2022-03-14 DIAGNOSIS — E785 Hyperlipidemia, unspecified: Secondary | ICD-10-CM | POA: Diagnosis not present

## 2022-03-14 DIAGNOSIS — N1832 Chronic kidney disease, stage 3b: Secondary | ICD-10-CM | POA: Diagnosis not present

## 2022-03-14 DIAGNOSIS — I509 Heart failure, unspecified: Secondary | ICD-10-CM | POA: Diagnosis not present

## 2022-03-14 DIAGNOSIS — I2510 Atherosclerotic heart disease of native coronary artery without angina pectoris: Secondary | ICD-10-CM | POA: Diagnosis not present

## 2022-03-14 DIAGNOSIS — I130 Hypertensive heart and chronic kidney disease with heart failure and stage 1 through stage 4 chronic kidney disease, or unspecified chronic kidney disease: Secondary | ICD-10-CM | POA: Diagnosis not present

## 2022-03-16 DIAGNOSIS — E785 Hyperlipidemia, unspecified: Secondary | ICD-10-CM | POA: Diagnosis not present

## 2022-03-16 DIAGNOSIS — I130 Hypertensive heart and chronic kidney disease with heart failure and stage 1 through stage 4 chronic kidney disease, or unspecified chronic kidney disease: Secondary | ICD-10-CM | POA: Diagnosis not present

## 2022-03-16 DIAGNOSIS — M419 Scoliosis, unspecified: Secondary | ICD-10-CM | POA: Diagnosis not present

## 2022-03-16 DIAGNOSIS — I2510 Atherosclerotic heart disease of native coronary artery without angina pectoris: Secondary | ICD-10-CM | POA: Diagnosis not present

## 2022-03-16 DIAGNOSIS — I509 Heart failure, unspecified: Secondary | ICD-10-CM | POA: Diagnosis not present

## 2022-03-16 DIAGNOSIS — N1832 Chronic kidney disease, stage 3b: Secondary | ICD-10-CM | POA: Diagnosis not present

## 2022-03-21 DIAGNOSIS — I2510 Atherosclerotic heart disease of native coronary artery without angina pectoris: Secondary | ICD-10-CM | POA: Diagnosis not present

## 2022-03-21 DIAGNOSIS — M419 Scoliosis, unspecified: Secondary | ICD-10-CM | POA: Diagnosis not present

## 2022-03-21 DIAGNOSIS — I130 Hypertensive heart and chronic kidney disease with heart failure and stage 1 through stage 4 chronic kidney disease, or unspecified chronic kidney disease: Secondary | ICD-10-CM | POA: Diagnosis not present

## 2022-03-21 DIAGNOSIS — N1832 Chronic kidney disease, stage 3b: Secondary | ICD-10-CM | POA: Diagnosis not present

## 2022-03-21 DIAGNOSIS — E785 Hyperlipidemia, unspecified: Secondary | ICD-10-CM | POA: Diagnosis not present

## 2022-03-21 DIAGNOSIS — I509 Heart failure, unspecified: Secondary | ICD-10-CM | POA: Diagnosis not present

## 2022-03-27 DIAGNOSIS — I509 Heart failure, unspecified: Secondary | ICD-10-CM | POA: Diagnosis not present

## 2022-03-27 DIAGNOSIS — M419 Scoliosis, unspecified: Secondary | ICD-10-CM | POA: Diagnosis not present

## 2022-03-27 DIAGNOSIS — E785 Hyperlipidemia, unspecified: Secondary | ICD-10-CM | POA: Diagnosis not present

## 2022-03-27 DIAGNOSIS — I130 Hypertensive heart and chronic kidney disease with heart failure and stage 1 through stage 4 chronic kidney disease, or unspecified chronic kidney disease: Secondary | ICD-10-CM | POA: Diagnosis not present

## 2022-03-27 DIAGNOSIS — I2510 Atherosclerotic heart disease of native coronary artery without angina pectoris: Secondary | ICD-10-CM | POA: Diagnosis not present

## 2022-03-27 DIAGNOSIS — N1832 Chronic kidney disease, stage 3b: Secondary | ICD-10-CM | POA: Diagnosis not present

## 2022-04-01 DIAGNOSIS — M1990 Unspecified osteoarthritis, unspecified site: Secondary | ICD-10-CM | POA: Diagnosis not present

## 2022-04-01 DIAGNOSIS — Z9181 History of falling: Secondary | ICD-10-CM | POA: Diagnosis not present

## 2022-04-01 DIAGNOSIS — M419 Scoliosis, unspecified: Secondary | ICD-10-CM | POA: Diagnosis not present

## 2022-04-01 DIAGNOSIS — I130 Hypertensive heart and chronic kidney disease with heart failure and stage 1 through stage 4 chronic kidney disease, or unspecified chronic kidney disease: Secondary | ICD-10-CM | POA: Diagnosis not present

## 2022-04-01 DIAGNOSIS — N1832 Chronic kidney disease, stage 3b: Secondary | ICD-10-CM | POA: Diagnosis not present

## 2022-04-01 DIAGNOSIS — E785 Hyperlipidemia, unspecified: Secondary | ICD-10-CM | POA: Diagnosis not present

## 2022-04-01 DIAGNOSIS — I2510 Atherosclerotic heart disease of native coronary artery without angina pectoris: Secondary | ICD-10-CM | POA: Diagnosis not present

## 2022-04-01 DIAGNOSIS — I509 Heart failure, unspecified: Secondary | ICD-10-CM | POA: Diagnosis not present

## 2022-04-04 DIAGNOSIS — M419 Scoliosis, unspecified: Secondary | ICD-10-CM | POA: Diagnosis not present

## 2022-04-04 DIAGNOSIS — I130 Hypertensive heart and chronic kidney disease with heart failure and stage 1 through stage 4 chronic kidney disease, or unspecified chronic kidney disease: Secondary | ICD-10-CM | POA: Diagnosis not present

## 2022-04-04 DIAGNOSIS — E785 Hyperlipidemia, unspecified: Secondary | ICD-10-CM | POA: Diagnosis not present

## 2022-04-04 DIAGNOSIS — N1832 Chronic kidney disease, stage 3b: Secondary | ICD-10-CM | POA: Diagnosis not present

## 2022-04-04 DIAGNOSIS — I509 Heart failure, unspecified: Secondary | ICD-10-CM | POA: Diagnosis not present

## 2022-04-04 DIAGNOSIS — I2510 Atherosclerotic heart disease of native coronary artery without angina pectoris: Secondary | ICD-10-CM | POA: Diagnosis not present

## 2022-04-11 DIAGNOSIS — I130 Hypertensive heart and chronic kidney disease with heart failure and stage 1 through stage 4 chronic kidney disease, or unspecified chronic kidney disease: Secondary | ICD-10-CM | POA: Diagnosis not present

## 2022-04-11 DIAGNOSIS — E785 Hyperlipidemia, unspecified: Secondary | ICD-10-CM | POA: Diagnosis not present

## 2022-04-11 DIAGNOSIS — M419 Scoliosis, unspecified: Secondary | ICD-10-CM | POA: Diagnosis not present

## 2022-04-11 DIAGNOSIS — I2510 Atherosclerotic heart disease of native coronary artery without angina pectoris: Secondary | ICD-10-CM | POA: Diagnosis not present

## 2022-04-11 DIAGNOSIS — I509 Heart failure, unspecified: Secondary | ICD-10-CM | POA: Diagnosis not present

## 2022-04-11 DIAGNOSIS — N1832 Chronic kidney disease, stage 3b: Secondary | ICD-10-CM | POA: Diagnosis not present

## 2022-04-18 DIAGNOSIS — M419 Scoliosis, unspecified: Secondary | ICD-10-CM | POA: Diagnosis not present

## 2022-04-18 DIAGNOSIS — E785 Hyperlipidemia, unspecified: Secondary | ICD-10-CM | POA: Diagnosis not present

## 2022-04-18 DIAGNOSIS — I2510 Atherosclerotic heart disease of native coronary artery without angina pectoris: Secondary | ICD-10-CM | POA: Diagnosis not present

## 2022-04-18 DIAGNOSIS — N1832 Chronic kidney disease, stage 3b: Secondary | ICD-10-CM | POA: Diagnosis not present

## 2022-04-18 DIAGNOSIS — I509 Heart failure, unspecified: Secondary | ICD-10-CM | POA: Diagnosis not present

## 2022-04-18 DIAGNOSIS — I130 Hypertensive heart and chronic kidney disease with heart failure and stage 1 through stage 4 chronic kidney disease, or unspecified chronic kidney disease: Secondary | ICD-10-CM | POA: Diagnosis not present

## 2022-04-26 DIAGNOSIS — M419 Scoliosis, unspecified: Secondary | ICD-10-CM | POA: Diagnosis not present

## 2022-04-26 DIAGNOSIS — I2510 Atherosclerotic heart disease of native coronary artery without angina pectoris: Secondary | ICD-10-CM | POA: Diagnosis not present

## 2022-04-26 DIAGNOSIS — I509 Heart failure, unspecified: Secondary | ICD-10-CM | POA: Diagnosis not present

## 2022-04-26 DIAGNOSIS — E785 Hyperlipidemia, unspecified: Secondary | ICD-10-CM | POA: Diagnosis not present

## 2022-04-26 DIAGNOSIS — I130 Hypertensive heart and chronic kidney disease with heart failure and stage 1 through stage 4 chronic kidney disease, or unspecified chronic kidney disease: Secondary | ICD-10-CM | POA: Diagnosis not present

## 2022-04-26 DIAGNOSIS — N1832 Chronic kidney disease, stage 3b: Secondary | ICD-10-CM | POA: Diagnosis not present

## 2022-05-01 DIAGNOSIS — R5381 Other malaise: Secondary | ICD-10-CM | POA: Diagnosis not present

## 2022-05-01 DIAGNOSIS — D638 Anemia in other chronic diseases classified elsewhere: Secondary | ICD-10-CM | POA: Diagnosis not present

## 2022-05-01 DIAGNOSIS — R634 Abnormal weight loss: Secondary | ICD-10-CM | POA: Diagnosis not present

## 2022-05-01 DIAGNOSIS — E611 Iron deficiency: Secondary | ICD-10-CM | POA: Diagnosis not present

## 2022-05-01 DIAGNOSIS — E871 Hypo-osmolality and hyponatremia: Secondary | ICD-10-CM | POA: Diagnosis not present

## 2022-05-01 DIAGNOSIS — R5383 Other fatigue: Secondary | ICD-10-CM | POA: Diagnosis not present

## 2022-05-04 DIAGNOSIS — D649 Anemia, unspecified: Secondary | ICD-10-CM | POA: Diagnosis not present

## 2022-06-27 ENCOUNTER — Other Ambulatory Visit: Payer: Self-pay | Admitting: Cardiology

## 2022-07-02 ENCOUNTER — Other Ambulatory Visit: Payer: Self-pay | Admitting: Cardiology

## 2022-07-16 DIAGNOSIS — R5383 Other fatigue: Secondary | ICD-10-CM | POA: Diagnosis not present

## 2022-07-16 DIAGNOSIS — N1832 Chronic kidney disease, stage 3b: Secondary | ICD-10-CM | POA: Diagnosis not present

## 2022-07-16 DIAGNOSIS — E611 Iron deficiency: Secondary | ICD-10-CM | POA: Diagnosis not present

## 2022-07-16 DIAGNOSIS — R7989 Other specified abnormal findings of blood chemistry: Secondary | ICD-10-CM | POA: Diagnosis not present

## 2022-07-16 DIAGNOSIS — E785 Hyperlipidemia, unspecified: Secondary | ICD-10-CM | POA: Diagnosis not present

## 2022-07-16 DIAGNOSIS — Z79899 Other long term (current) drug therapy: Secondary | ICD-10-CM | POA: Diagnosis not present

## 2022-07-23 DIAGNOSIS — R82998 Other abnormal findings in urine: Secondary | ICD-10-CM | POA: Diagnosis not present

## 2022-07-23 DIAGNOSIS — I509 Heart failure, unspecified: Secondary | ICD-10-CM | POA: Diagnosis not present

## 2022-07-23 DIAGNOSIS — N1832 Chronic kidney disease, stage 3b: Secondary | ICD-10-CM | POA: Diagnosis not present

## 2022-07-23 DIAGNOSIS — R2689 Other abnormalities of gait and mobility: Secondary | ICD-10-CM | POA: Diagnosis not present

## 2022-07-23 DIAGNOSIS — M419 Scoliosis, unspecified: Secondary | ICD-10-CM | POA: Diagnosis not present

## 2022-07-23 DIAGNOSIS — Z1331 Encounter for screening for depression: Secondary | ICD-10-CM | POA: Diagnosis not present

## 2022-07-23 DIAGNOSIS — E785 Hyperlipidemia, unspecified: Secondary | ICD-10-CM | POA: Diagnosis not present

## 2022-07-23 DIAGNOSIS — Z1339 Encounter for screening examination for other mental health and behavioral disorders: Secondary | ICD-10-CM | POA: Diagnosis not present

## 2022-07-23 DIAGNOSIS — I130 Hypertensive heart and chronic kidney disease with heart failure and stage 1 through stage 4 chronic kidney disease, or unspecified chronic kidney disease: Secondary | ICD-10-CM | POA: Diagnosis not present

## 2022-07-23 DIAGNOSIS — I2510 Atherosclerotic heart disease of native coronary artery without angina pectoris: Secondary | ICD-10-CM | POA: Diagnosis not present

## 2022-07-23 DIAGNOSIS — Z0000 Encounter for general adult medical examination without abnormal findings: Secondary | ICD-10-CM | POA: Diagnosis not present

## 2022-07-24 DIAGNOSIS — M419 Scoliosis, unspecified: Secondary | ICD-10-CM | POA: Diagnosis not present

## 2022-07-24 DIAGNOSIS — M47816 Spondylosis without myelopathy or radiculopathy, lumbar region: Secondary | ICD-10-CM | POA: Diagnosis not present

## 2022-07-24 DIAGNOSIS — M40209 Unspecified kyphosis, site unspecified: Secondary | ICD-10-CM | POA: Diagnosis not present

## 2022-07-25 ENCOUNTER — Inpatient Hospital Stay (HOSPITAL_BASED_OUTPATIENT_CLINIC_OR_DEPARTMENT_OTHER)
Admission: EM | Admit: 2022-07-25 | Discharge: 2022-07-28 | DRG: 641 | Disposition: A | Discharge status: Home Health | Payer: Medicare Other | Attending: Internal Medicine | Admitting: Internal Medicine

## 2022-07-25 ENCOUNTER — Encounter (HOSPITAL_COMMUNITY): Payer: Self-pay

## 2022-07-25 ENCOUNTER — Emergency Department (HOSPITAL_BASED_OUTPATIENT_CLINIC_OR_DEPARTMENT_OTHER): Payer: Medicare Other

## 2022-07-25 ENCOUNTER — Encounter (HOSPITAL_BASED_OUTPATIENT_CLINIC_OR_DEPARTMENT_OTHER): Payer: Self-pay

## 2022-07-25 DIAGNOSIS — E861 Hypovolemia: Secondary | ICD-10-CM | POA: Diagnosis present

## 2022-07-25 DIAGNOSIS — N183 Chronic kidney disease, stage 3 unspecified: Secondary | ICD-10-CM | POA: Diagnosis not present

## 2022-07-25 DIAGNOSIS — C679 Malignant neoplasm of bladder, unspecified: Secondary | ICD-10-CM | POA: Diagnosis present

## 2022-07-25 DIAGNOSIS — R531 Weakness: Secondary | ICD-10-CM | POA: Diagnosis not present

## 2022-07-25 DIAGNOSIS — N179 Acute kidney failure, unspecified: Secondary | ICD-10-CM | POA: Diagnosis present

## 2022-07-25 DIAGNOSIS — Z90710 Acquired absence of both cervix and uterus: Secondary | ICD-10-CM | POA: Diagnosis not present

## 2022-07-25 DIAGNOSIS — G4733 Obstructive sleep apnea (adult) (pediatric): Secondary | ICD-10-CM | POA: Diagnosis present

## 2022-07-25 DIAGNOSIS — I2510 Atherosclerotic heart disease of native coronary artery without angina pectoris: Secondary | ICD-10-CM | POA: Diagnosis not present

## 2022-07-25 DIAGNOSIS — Z8249 Family history of ischemic heart disease and other diseases of the circulatory system: Secondary | ICD-10-CM

## 2022-07-25 DIAGNOSIS — I130 Hypertensive heart and chronic kidney disease with heart failure and stage 1 through stage 4 chronic kidney disease, or unspecified chronic kidney disease: Secondary | ICD-10-CM | POA: Diagnosis present

## 2022-07-25 DIAGNOSIS — I429 Cardiomyopathy, unspecified: Secondary | ICD-10-CM | POA: Diagnosis present

## 2022-07-25 DIAGNOSIS — E860 Dehydration: Secondary | ICD-10-CM | POA: Diagnosis present

## 2022-07-25 DIAGNOSIS — Z8551 Personal history of malignant neoplasm of bladder: Secondary | ICD-10-CM | POA: Diagnosis not present

## 2022-07-25 DIAGNOSIS — E871 Hypo-osmolality and hyponatremia: Secondary | ICD-10-CM | POA: Diagnosis not present

## 2022-07-25 DIAGNOSIS — N184 Chronic kidney disease, stage 4 (severe): Secondary | ICD-10-CM | POA: Diagnosis not present

## 2022-07-25 DIAGNOSIS — D631 Anemia in chronic kidney disease: Secondary | ICD-10-CM | POA: Diagnosis present

## 2022-07-25 DIAGNOSIS — D649 Anemia, unspecified: Secondary | ICD-10-CM | POA: Diagnosis present

## 2022-07-25 DIAGNOSIS — I5022 Chronic systolic (congestive) heart failure: Secondary | ICD-10-CM | POA: Diagnosis present

## 2022-07-25 DIAGNOSIS — I739 Peripheral vascular disease, unspecified: Secondary | ICD-10-CM | POA: Diagnosis present

## 2022-07-25 DIAGNOSIS — I509 Heart failure, unspecified: Secondary | ICD-10-CM | POA: Diagnosis not present

## 2022-07-25 DIAGNOSIS — I10 Essential (primary) hypertension: Secondary | ICD-10-CM | POA: Diagnosis present

## 2022-07-25 DIAGNOSIS — Z85038 Personal history of other malignant neoplasm of large intestine: Secondary | ICD-10-CM | POA: Diagnosis not present

## 2022-07-25 DIAGNOSIS — E7800 Pure hypercholesterolemia, unspecified: Secondary | ICD-10-CM | POA: Diagnosis not present

## 2022-07-25 LAB — NA AND K (SODIUM & POTASSIUM), RAND UR
Potassium Urine: 23 mmol/L
Sodium, Ur: 37 mmol/L

## 2022-07-25 LAB — BASIC METABOLIC PANEL
Anion gap: 10 (ref 5–15)
BUN: 69 mg/dL — ABNORMAL HIGH (ref 8–23)
CO2: 16 mmol/L — ABNORMAL LOW (ref 22–32)
Calcium: 8.5 mg/dL — ABNORMAL LOW (ref 8.9–10.3)
Chloride: 92 mmol/L — ABNORMAL LOW (ref 98–111)
Creatinine, Ser: 2 mg/dL — ABNORMAL HIGH (ref 0.44–1.00)
GFR, Estimated: 23 mL/min — ABNORMAL LOW (ref >60)
Glucose, Bld: 121 mg/dL — ABNORMAL HIGH (ref 70–99)
Potassium: 5.6 mmol/L — ABNORMAL HIGH (ref 3.5–5.1)
Sodium: 118 mmol/L — CL (ref 135–145)

## 2022-07-25 LAB — URINALYSIS, ROUTINE W REFLEX MICROSCOPIC
Bilirubin Urine: NEGATIVE
Glucose, UA: NEGATIVE mg/dL
Ketones, ur: NEGATIVE mg/dL
Nitrite: NEGATIVE
Protein, ur: 30 mg/dL — AB
Specific Gravity, Urine: 1.011 (ref 1.005–1.030)
WBC, UA: >50 WBC/hpf — ABNORMAL HIGH (ref 0–5)
pH: 5.5 (ref 5.0–8.0)

## 2022-07-25 LAB — MAGNESIUM: Magnesium: 2 mg/dL (ref 1.7–2.4)

## 2022-07-25 LAB — CBC
HCT: 21.9 % — ABNORMAL LOW (ref 36.0–46.0)
Hemoglobin: 7.7 g/dL — ABNORMAL LOW (ref 12.0–15.0)
MCH: 32.8 pg (ref 26.0–34.0)
MCHC: 35.2 g/dL (ref 30.0–36.0)
MCV: 93.2 fL (ref 80.0–100.0)
Platelets: 166 10*3/uL (ref 150–400)
RBC: 2.35 MIL/uL — ABNORMAL LOW (ref 3.87–5.11)
RDW: 12.7 % (ref 11.5–15.5)
WBC: 5.6 10*3/uL (ref 4.0–10.5)
nRBC: 0 % (ref 0.0–0.2)

## 2022-07-25 MED ORDER — SODIUM CHLORIDE 0.9 % IV BOLUS (SEPSIS)
500.0000 mL | Freq: Once | INTRAVENOUS | Status: AC
  Administered 2022-07-25: 500 mL via INTRAVENOUS

## 2022-07-25 MED ORDER — SODIUM CHLORIDE 0.9 % IV SOLN: Freq: Once | INTRAVENOUS | Status: AC

## 2022-07-25 NOTE — ED Provider Notes (Signed)
[facility] EMERGENCY DEPT Provider Note   CSN: [id number] Arrival date & time: 07/25/22  1939     History {Add pertinent medical, surgical, social history, OB history to HPI:1} Chief Complaint  Patient presents with   abnormal labs    Paige Cook is a 88 y.o. female.  88 yo F with hx of CHF and CKD who presents with abnormal labs. Pts daughters state that she was in her usual state of health until last week. They attributed it to dehydration. Went to PCP and she had AKI. They stopped her lasix and told her to liberalize her fluid intake. They repeated labs this Monday that showed she was hyponatremic to 121 (down from 131 on 8/3). Not on HCTZ. No steroids recently. No hx of brain cancer or injury, COPD, or lung cancer. Says that she has been weaker than usual having to hold onto things to get around.         Home Medications Prior to Admission medications   Medication Sig Start Date End Date Taking? Authorizing Provider  furosemide (LASIX) 40 MG tablet Take 1/2 tablet daily if needed 01/30/22   Martin[place], Peter M, MD  aspirin 81 MG chewable tablet Chew 81 mg by mouth daily with breakfast.    [provider]  carvedilol (COREG) 6.25 MG tablet TAKE 1 TABLET BY MOUTH TWICE A DAY WITH MEALS 11/08/21   Martinique, Peter M, MD  Cholecalciferol (VITAMIN D3) 50 MCG (2000 UT) TABS Take 50 mcg by mouth.    [provider]  furosemide (LASIX) 20 MG tablet TAKE 1 TABLET BY MOUTH EVERY DAY 06/28/22   Martin[place], Peter M, MD  hydrALAZINE (APRESOLINE) 10 MG tablet TAKE 1 TABLET BY MOUTH THREE TIMES A DAY 06/28/22   Martin[place], Peter M, MD  saccharomyces boulardii (FLORASTOR) 250 MG capsule Take 250 mg by mouth daily.    [provider]  vitamin B-12 (CYANOCOBALAMIN) 250 MCG tablet Take 250 mcg by mouth daily.    [provider]  vitamin C (ASCORBIC ACID) 250 MG tablet Take 500 mg by mouth daily with breakfast.     [provider]      Allergies     Penicillins and Tramadol    Review of Systems   Review of Systems  Physical Exam Updated Vital Signs BP (!) 150/53 (BP Location: Left Arm)   Pulse 70   Temp 98.3 F (36.8 C)   Resp (!) 30   Ht '5\' 3"'$  (1.6 m)   Wt 45.4 kg   SpO2 99%   BMI 17.71 kg/m  Physical Exam  ED Results / Procedures / Treatments   Labs (all labs ordered are listed, but only abnormal results are displayed) Labs Reviewed  BASIC METABOLIC PANEL - Abnormal; Notable for the following components:      Result Value   Sodium 118 (*)    Potassium 5.6 (*)    Chloride 92 (*)    CO2 16 (*)    Glucose, Bld 121 (*)    BUN 69 (*)    Creatinine, Ser 2.00 (*)    Calcium 8.5 (*)    GFR, Estimated 23 (*)    All other components within normal limits  CBC - Abnormal; Notable for the following components:   RBC 2.35 (*)    Hemoglobin 7.7 (*)    HCT 21.9 (*)    All other components within normal limits  MAGNESIUM  URINALYSIS, ROUTINE W REFLEX MICROSCOPIC  NA AND K (SODIUM & POTASSIUM),  RAND UR  OSMOLALITY, URINE  OSMOLALITY    EKG None  Radiology No results found.  Procedures Procedures  {Document cardiac monitor, telemetry assessment procedure when appropriate:1}  Medications Ordered in ED Medications  sodium chloride 0.9 % bolus 500 mL (has no administration in time range)  0.9 %  sodium chloride infusion (has no administration in time range)    ED Course/ Medical Decision Making/ A&P                           Medical Decision Making Amount and/or Complexity of Data Reviewed Labs: ordered. Radiology: ordered.  Risk Prescription drug management. Decision regarding hospitalization.   ***  {Document critical care time when appropriate:1} {Document review of labs and clinical decision tools ie heart score, Chads2Vasc2 etc:1}  {Document your independent review of radiology images, and any outside records:1} {Document your discussion with family members, caretakers, and with  consultants:1} {Document social determinants of health affecting pt's care:1} {Document your decision making why or why not admission, treatments were needed:1} Final Clinical Impression(s) / ED Diagnoses Final diagnoses:  Hyponatremia    Rx / DC Orders ED Discharge Orders     None

## 2022-07-25 NOTE — ED Notes (Signed)
Patient ambulated to restroom.

## 2022-07-25 NOTE — ED Triage Notes (Signed)
Pt presents to the ED with daughter. States that she had blood work done on Monday and states that her sodium was 121. Pt reports increased fatigue. Pt A&Ox4 at time of triage. VSS.

## 2022-07-26 ENCOUNTER — Encounter: Payer: Self-pay | Admitting: Cardiology

## 2022-07-26 ENCOUNTER — Other Ambulatory Visit: Payer: Self-pay

## 2022-07-26 ENCOUNTER — Encounter (HOSPITAL_BASED_OUTPATIENT_CLINIC_OR_DEPARTMENT_OTHER): Payer: Self-pay | Admitting: Internal Medicine

## 2022-07-26 DIAGNOSIS — D649 Anemia, unspecified: Secondary | ICD-10-CM | POA: Diagnosis not present

## 2022-07-26 DIAGNOSIS — N179 Acute kidney failure, unspecified: Secondary | ICD-10-CM | POA: Diagnosis present

## 2022-07-26 DIAGNOSIS — I5022 Chronic systolic (congestive) heart failure: Secondary | ICD-10-CM | POA: Diagnosis present

## 2022-07-26 DIAGNOSIS — I2510 Atherosclerotic heart disease of native coronary artery without angina pectoris: Secondary | ICD-10-CM | POA: Diagnosis present

## 2022-07-26 DIAGNOSIS — G4733 Obstructive sleep apnea (adult) (pediatric): Secondary | ICD-10-CM

## 2022-07-26 DIAGNOSIS — E7800 Pure hypercholesterolemia, unspecified: Secondary | ICD-10-CM

## 2022-07-26 DIAGNOSIS — E871 Hypo-osmolality and hyponatremia: Secondary | ICD-10-CM | POA: Diagnosis present

## 2022-07-26 DIAGNOSIS — C679 Malignant neoplasm of bladder, unspecified: Secondary | ICD-10-CM | POA: Diagnosis not present

## 2022-07-26 DIAGNOSIS — N184 Chronic kidney disease, stage 4 (severe): Secondary | ICD-10-CM | POA: Diagnosis present

## 2022-07-26 DIAGNOSIS — E861 Hypovolemia: Secondary | ICD-10-CM | POA: Diagnosis present

## 2022-07-26 DIAGNOSIS — E860 Dehydration: Secondary | ICD-10-CM | POA: Diagnosis present

## 2022-07-26 DIAGNOSIS — N183 Chronic kidney disease, stage 3 unspecified: Secondary | ICD-10-CM | POA: Diagnosis not present

## 2022-07-26 DIAGNOSIS — D631 Anemia in chronic kidney disease: Secondary | ICD-10-CM | POA: Diagnosis present

## 2022-07-26 DIAGNOSIS — Z8249 Family history of ischemic heart disease and other diseases of the circulatory system: Secondary | ICD-10-CM | POA: Diagnosis not present

## 2022-07-26 DIAGNOSIS — R531 Weakness: Secondary | ICD-10-CM | POA: Diagnosis not present

## 2022-07-26 DIAGNOSIS — I429 Cardiomyopathy, unspecified: Secondary | ICD-10-CM | POA: Diagnosis present

## 2022-07-26 DIAGNOSIS — I10 Essential (primary) hypertension: Secondary | ICD-10-CM | POA: Diagnosis not present

## 2022-07-26 DIAGNOSIS — I739 Peripheral vascular disease, unspecified: Secondary | ICD-10-CM | POA: Diagnosis present

## 2022-07-26 DIAGNOSIS — Z90710 Acquired absence of both cervix and uterus: Secondary | ICD-10-CM | POA: Diagnosis not present

## 2022-07-26 DIAGNOSIS — Z85038 Personal history of other malignant neoplasm of large intestine: Secondary | ICD-10-CM | POA: Diagnosis not present

## 2022-07-26 DIAGNOSIS — Z8551 Personal history of malignant neoplasm of bladder: Secondary | ICD-10-CM | POA: Diagnosis not present

## 2022-07-26 DIAGNOSIS — I130 Hypertensive heart and chronic kidney disease with heart failure and stage 1 through stage 4 chronic kidney disease, or unspecified chronic kidney disease: Secondary | ICD-10-CM | POA: Diagnosis present

## 2022-07-26 LAB — BASIC METABOLIC PANEL
Anion gap: 10 (ref 5–15)
Anion gap: 11 (ref 5–15)
Anion gap: 8 (ref 5–15)
Anion gap: 7 (ref 5–15)
BUN: 68 mg/dL — ABNORMAL HIGH (ref 8–23)
BUN: 66 mg/dL — ABNORMAL HIGH (ref 8–23)
BUN: 61 mg/dL — ABNORMAL HIGH (ref 8–23)
BUN: 63 mg/dL — ABNORMAL HIGH (ref 8–23)
CO2: 13 mmol/L — ABNORMAL LOW (ref 22–32)
CO2: 14 mmol/L — ABNORMAL LOW (ref 22–32)
CO2: 15 mmol/L — ABNORMAL LOW (ref 22–32)
CO2: 16 mmol/L — ABNORMAL LOW (ref 22–32)
Calcium: 8.2 mg/dL — ABNORMAL LOW (ref 8.9–10.3)
Calcium: 8.3 mg/dL — ABNORMAL LOW (ref 8.9–10.3)
Calcium: 8.1 mg/dL — ABNORMAL LOW (ref 8.9–10.3)
Calcium: 8 mg/dL — ABNORMAL LOW (ref 8.9–10.3)
Chloride: 96 mmol/L — ABNORMAL LOW (ref 98–111)
Chloride: 96 mmol/L — ABNORMAL LOW (ref 98–111)
Chloride: 99 mmol/L (ref 98–111)
Chloride: 99 mmol/L (ref 98–111)
Creatinine, Ser: 1.92 mg/dL — ABNORMAL HIGH (ref 0.44–1.00)
Creatinine, Ser: 1.91 mg/dL — ABNORMAL HIGH (ref 0.44–1.00)
Creatinine, Ser: 1.74 mg/dL — ABNORMAL HIGH (ref 0.44–1.00)
Creatinine, Ser: 2.04 mg/dL — ABNORMAL HIGH (ref 0.44–1.00)
GFR, Estimated: 24 mL/min — ABNORMAL LOW (ref >60)
GFR, Estimated: 24 mL/min — ABNORMAL LOW (ref >60)
GFR, Estimated: 27 mL/min — ABNORMAL LOW (ref >60)
GFR, Estimated: 22 mL/min — ABNORMAL LOW (ref >60)
Glucose, Bld: 111 mg/dL — ABNORMAL HIGH (ref 70–99)
Glucose, Bld: 96 mg/dL (ref 70–99)
Glucose, Bld: 145 mg/dL — ABNORMAL HIGH (ref 70–99)
Glucose, Bld: 141 mg/dL — ABNORMAL HIGH (ref 70–99)
Potassium: 5.3 mmol/L — ABNORMAL HIGH (ref 3.5–5.1)
Potassium: 5.3 mmol/L — ABNORMAL HIGH (ref 3.5–5.1)
Potassium: 5.5 mmol/L — ABNORMAL HIGH (ref 3.5–5.1)
Potassium: 5 mmol/L (ref 3.5–5.1)
Sodium: 119 mmol/L — CL (ref 135–145)
Sodium: 121 mmol/L — ABNORMAL LOW (ref 135–145)
Sodium: 122 mmol/L — ABNORMAL LOW (ref 135–145)
Sodium: 122 mmol/L — ABNORMAL LOW (ref 135–145)

## 2022-07-26 LAB — CBC
HCT: 22.7 % — ABNORMAL LOW (ref 36.0–46.0)
Hemoglobin: 7.7 g/dL — ABNORMAL LOW (ref 12.0–15.0)
MCH: 32.9 pg (ref 26.0–34.0)
MCHC: 33.9 g/dL (ref 30.0–36.0)
MCV: 97 fL (ref 80.0–100.0)
Platelets: 153 10*3/uL (ref 150–400)
RBC: 2.34 MIL/uL — ABNORMAL LOW (ref 3.87–5.11)
RDW: 12.4 % (ref 11.5–15.5)
WBC: 5.8 10*3/uL (ref 4.0–10.5)
nRBC: 0 % (ref 0.0–0.2)

## 2022-07-26 LAB — TYPE AND SCREEN
ABO/RH(D): O NEG
Antibody Screen: NEGATIVE

## 2022-07-26 LAB — OSMOLALITY, URINE: Osmolality, Ur: 348 mOsm/kg (ref 300–900)

## 2022-07-26 LAB — OSMOLALITY: Osmolality: 272 mOsm/kg — ABNORMAL LOW (ref 275–295)

## 2022-07-26 MED ORDER — CEFTRIAXONE SODIUM 1 G IJ SOLR
1.0000 g | INTRAMUSCULAR | Status: AC
  Administered 2022-07-26: 1 g via INTRAVENOUS
  Filled 2022-07-26: qty 10

## 2022-07-26 MED ORDER — SODIUM CHLORIDE 0.9% FLUSH
3.0000 mL | Freq: Two times a day (BID) | INTRAVENOUS | Status: DC
  Administered 2022-07-26 – 2022-07-28 (×3): 3 mL via INTRAVENOUS

## 2022-07-26 MED ORDER — ACETAMINOPHEN 650 MG RE SUPP: 650.0000 mg | Freq: Four times a day (QID) | RECTAL | Status: DC | PRN

## 2022-07-26 MED ORDER — ENOXAPARIN SODIUM 30 MG/0.3ML IJ SOSY
30.0000 mg | PREFILLED_SYRINGE | INTRAMUSCULAR | Status: DC
  Administered 2022-07-26: 30 mg via SUBCUTANEOUS
  Filled 2022-07-26: qty 0.3

## 2022-07-26 MED ORDER — POLYETHYLENE GLYCOL 3350 17 G PO PACK
17.0000 g | PACK | Freq: Every day | ORAL | Status: DC | PRN
  Administered 2022-07-27: 17 g via ORAL
  Filled 2022-07-26: qty 1

## 2022-07-26 MED ORDER — SACCHAROMYCES BOULARDII 250 MG PO CAPS
250.0000 mg | ORAL_CAPSULE | Freq: Every day | ORAL | Status: DC
  Administered 2022-07-26 – 2022-07-28 (×3): 250 mg via ORAL
  Filled 2022-07-26 (×3): qty 1

## 2022-07-26 MED ORDER — CARVEDILOL 6.25 MG PO TABS
6.2500 mg | ORAL_TABLET | Freq: Two times a day (BID) | ORAL | Status: DC
  Administered 2022-07-26 – 2022-07-28 (×5): 6.25 mg via ORAL
  Filled 2022-07-26 (×5): qty 1

## 2022-07-26 MED ORDER — ASPIRIN 81 MG PO CHEW
81.0000 mg | CHEWABLE_TABLET | Freq: Every day | ORAL | Status: DC
  Administered 2022-07-26: 81 mg via ORAL
  Filled 2022-07-26 (×2): qty 1

## 2022-07-26 MED ORDER — SODIUM CHLORIDE 0.9 % IV SOLN: INTRAVENOUS | Status: AC

## 2022-07-26 MED ORDER — ACETAMINOPHEN 325 MG PO TABS
650.0000 mg | ORAL_TABLET | Freq: Once | ORAL | Status: AC
  Administered 2022-07-26: 650 mg via ORAL
  Filled 2022-07-26: qty 2

## 2022-07-26 MED ORDER — ACETAMINOPHEN 325 MG PO TABS
650.0000 mg | ORAL_TABLET | Freq: Four times a day (QID) | ORAL | Status: DC | PRN
  Administered 2022-07-27: 650 mg via ORAL
  Filled 2022-07-26: qty 2

## 2022-07-26 MED ORDER — SODIUM CHLORIDE 0.9 % IV SOLN: INTRAVENOUS | Status: DC

## 2022-07-26 NOTE — H&P (Signed)
History and Physical   Paige Cook DOB: 1928/12/07 DOA: 07/25/2022  PCP: Burnard Bunting, MD   Patient coming from: Home  Chief Complaint: Generalized weakness  HPI: Paige Cook is a 88 y.o. female with medical history significant of hyperlipidemia, CAD, OSA, hypertension, CKD 3-4, CHF, PAD presenting with generalized weakness.  Patient said generalized weakness going on for about a week.  She saw her PCP on 8/7 for the same complaint and had labs checked and was noted to have a sodium of 121 with previous checked less than a week prior at 131 and her creatinine was elevated to 2.8 with a baseline previous to being around 2.  PCP diagnosed her with hyponatremia and AKI secondary to dehydration/overdiuresis.  Patient was told to hold her home Lasix and increase her water intake.  She follows instructions for 2 days but due to continued generalized weakness she is presented to the ED for further evaluation.  She denies fevers, chills, chest pain, shortness of breath, abdominal pain, constipation, diarrhea, nausea, vomiting.  ED Course: Vital signs in the ED significant for respirate in the teens to 18s, blood pressure in the 614E 315Q systolic.  Lab workup included BMP with sodium initially 118, recheck as of this morning and improved to 121, chloride 96, potassium 5.6, recheck over this morning had improved to 5.3, bicarb 16, BUN 69, creatinine stable at 2, glucose 120s, calcium 8.5.  CBC with hemoglobin stable at 10.7 last check in our system was at 8 a year ago.  Urinalysis with leukocytes and bacteria.  Chest x-ray showed stable cardiomegaly only.  Patient received Tylenol aspirin, ceftriaxone, carvedilol, 500 cc bolus and started on a rate of 40 cc an hour in the ED.  Patient accepted for transfer from [place] to [facility] for treatment and workup of her hyponatremia at around 2 AM this morning.  Patient arrived around 6:30 PM.  As above did have BMP trended  overnight but last check was at 8 AM (around 10 hours ago) at that time as above sodium had improved from 1 18-1 21 and creatinine remained stable and potassium improved from 5.6-5.3.  Urine sodium checked came back at 37 and urine awesome's was low normal.  Serum awesome's appropriately low at 272.  Review of Systems: As per HPI otherwise all other systems reviewed and are negative.  Past Medical History:  Diagnosis Date   Acute on chronic systolic (congestive) heart failure ([place]) 08/14/2019   AKI (acute kidney injury) ([place]) 05/22/2021   Allergic rhinitis    Anemia    Arthritis    Bladder cancer ([place])    giant tumor   CAD (coronary artery disease)    Cardiac arrhythmia    benign   Carotid bruit    left   CHF (congestive heart failure) ([place])    CHF with systolic dysfuntion ; dilated nonishemic cardiomyopathy   Colon cancer ([place]) 1989   Colon polyps    Diverticulosis    Epistaxis    Female bladder prolapse    Gallstones    Heart failure (HCC)    Hypercholesteremia    Hyponatremia    Probably secondary to her CHF   OSA (obstructive sleep apnea) 04/29/2012   SOB (shortness of breath)    Sometimes at night    Past Surgical History:  Procedure Laterality Date   ABDOMINAL HYSTERECTOMY     BLADDER SURGERY  2013   cancer removed   bladder tact     CARDIAC  CATHETERIZATION     non obstruction CAD   CHOLECYSTECTOMY     COLECTOMY     sigmoid 1989 cancer   COLONOSCOPY      Social History  reports that she has never smoked. She has never used smokeless tobacco. She reports that she does not drink alcohol and does not use drugs.  Allergies  Allergen Reactions   Penicillins Hives    Has patient had a PCN reaction causing immediate rash, facial/tongue/throat swelling, SOB or lightheadedness with hypotension: Yes Has patient had a PCN reaction causing severe rash involving mucus membranes or skin necrosis: Yes Has patient had a PCN reaction that required hospitalization No Has  patient had a PCN reaction occurring within the last 10 years: No If all of the above answers are "NO", then may proceed with Cephalosporin use.    Tramadol Nausea Only and Other (See Comments)    shakes    Family History  Problem Relation Age of Onset   Heart disease Father    Heart attack Father    Heart attack Mother    Heart failure Sister 59   Stroke Neg Hx   Reviewed on admission  Prior to Admission medications   Medication Sig Start Date End Date Taking? Authorizing Provider  carvedilol (COREG) 6.25 MG tablet TAKE 1 TABLET BY MOUTH TWICE A DAY WITH MEALS 11/08/21  Yes [place], Peter M, MD  Cholecalciferol (VITAMIN D3) 50 MCG (2000 UT) TABS Take 50 mcg by mouth.   Yes [provider]  furosemide (LASIX) 20 MG tablet TAKE 1 TABLET BY MOUTH EVERY DAY 06/28/22  Yes [place], Peter M, MD  hydrALAZINE (APRESOLINE) 10 MG tablet TAKE 1 TABLET BY MOUTH THREE TIMES A DAY 06/28/22  Yes [place], Peter M, MD  saccharomyces boulardii (FLORASTOR) 250 MG capsule Take 250 mg by mouth daily.   Yes [provider]  vitamin C (ASCORBIC ACID) 250 MG tablet Take 500 mg by mouth daily with breakfast.    Yes [provider]    Physical Exam: Vitals:   07/26/22 1515 07/26/22 1630 07/26/22 1700 07/26/22 1800  BP: (!) 169/63  125/87   Pulse: 76 70 70   Resp: 16  16   Temp:   98 F (36.7 C) 97.7 F (36.5 C)  TempSrc:    Oral  SpO2: 97% 95% 96%   Weight:      Height:        Physical Exam Constitutional:      General: She is not in acute distress.    Appearance: Normal appearance.     Comments: Thin elderly female  HENT:     Head: Normocephalic and atraumatic.     Mouth/Throat:     Mouth: Mucous membranes are moist.     Pharynx: Oropharynx is clear.  Eyes:     Extraocular Movements: Extraocular movements intact.     Pupils: Pupils are equal, round, and reactive to light.  Cardiovascular:     Rate and Rhythm: Normal rate and regular rhythm.     Pulses: Normal  pulses.     Heart sounds: Normal heart sounds.  Pulmonary:     Effort: Pulmonary effort is normal. No respiratory distress.     Breath sounds: Normal breath sounds.  Abdominal:     General: Bowel sounds are normal. There is no distension.     Palpations: Abdomen is soft.     Tenderness: There is no abdominal tenderness.  Musculoskeletal:        General:  No swelling or deformity.  Skin:    General: Skin is warm and dry.  Neurological:     General: No focal deficit present.     Mental Status: Mental status is at baseline.    Labs on Admission: I have personally reviewed following labs and imaging studies  CBC: Recent Labs  Lab 07/25/22 2009 07/26/22 1851  WBC 5.6 5.8  HGB 7.7* 7.7*  HCT 21.9* 22.7*  MCV 93.2 97.0  PLT 166 825    Basic Metabolic Panel: Recent Labs  Lab 07/25/22 2009 07/26/22 0200 07/26/22 0805 07/26/22 1851  NA 118* 119* 121* 122*  K 5.6* 5.3* 5.3* 5.5*  CL 92* 96* 96* 99  CO2 16* 13* 14* 15*  GLUCOSE 121* 111* 96 145*  BUN 69* 68* 66* 61*  CREATININE 2.00* 1.92* 1.91* 1.74*  CALCIUM 8.5* 8.2* 8.3* 8.1*  MG 2.0  --   --   --     GFR: Estimated Creatinine Clearance: 14.8 mL/min (A) (by C-G formula based on SCr of 1.74 mg/dL (H)).  Liver Function Tests: No results for input(s): "AST", "ALT", "ALKPHOS", "BILITOT", "PROT", "ALBUMIN" in the last 168 hours.  Urine analysis:    Component Value Date/Time   COLORURINE YELLOW 07/25/2022 2114   APPEARANCEUR CLOUDY (A) 07/25/2022 2114   LABSPEC 1.011 07/25/2022 2114   PHURINE 5.5 07/25/2022 2114   GLUCOSEU NEGATIVE 07/25/2022 2114   HGBUR SMALL (A) 07/25/2022 2114   BILIRUBINUR NEGATIVE 07/25/2022 2114   [place] NEGATIVE 07/25/2022 2114   PROTEINUR 30 (A) 07/25/2022 2114   UROBILINOGEN 0.2 12/06/2009 0207   NITRITE NEGATIVE 07/25/2022 2114   LEUKOCYTESUR LARGE (A) 07/25/2022 2114    Radiological Exams on Admission: DG Chest 1 View  Result Date: 07/25/2022 CLINICAL DATA:  Congestive heart  failure EXAM: CHEST  1 VIEW COMPARISON:  None Available. FINDINGS: Left suprahilar nodular opacity likely represents vascular shadow, similar to CT examination of 12/22/2009. Lungs are otherwise clear. No pneumothorax or pleural effusion. Mild cardiomegaly is stable. Pulmonary vascularity is normal. No acute bone abnormality. IMPRESSION: Stable cardiomegaly. No radiographic evidence of acute cardiopulmonary disease. Electronically Signed   By: Fidela Salisbury M.D.   On: 07/25/2022 22:02    EKG: Independently reviewed.  Sinus rhythm with low voltage and PVC noted.  Electronically read as right bundle branch block however this appears to be incorrect, but voltage level makes this difficult to interpret  Assessment/Plan Principal Problem:   Acute hyponatremia Active Problems:   Hypercholesteremia   CAD (coronary artery disease)   OSA (obstructive sleep apnea)   CKD (chronic kidney disease) stage 4, GFR 15-29 ml/min (HCC)   HTN (hypertension)   Chronic systolic CHF (congestive heart failure) (HCC)   Anemia   Malignant neoplasm of bladder, unspecified (HCC)   Generalized weakness   Acute hyponatremia > Patient presented with generalized weakness found to have ongoing hyponatremia.  First noted on lab work on 8/7 with sodium of 121 on present [place] soon had further drop to 118, as of 8 AM this morning sodium had improved to 1 5:21 100 cc in 48-hour.  This last check however was 10 hours ago. > Patient's lab work showed improvement in creatinine from outpatient visit back to her normal level of 2, urine sodium of 37, urine osmolality low normal and serum osmolality of 272.  Consistent with hypovolemic hyponatremia.  Further reinforced by improvement with IV fluid administration. > Will need to recheck and continue to trend sodium as it has been 10 hours since  last check, will hold off on further fluids until we have these lab results to ensure patient has an overcorrected.  Goal sodium based on  presentation of 118 would be 124-126, if we have reached this goal will continue to trend but will hold off on further fluids. - Continue to monitor on progressive unit - BMP now and every 4 hours - IV fluids depending on results of repeat BMP ADDENDUM > Repeat sodium came back at 122 - We will continue with normal saline overnight increase rate to 75 cc an hour and trend as above  Generalized weakness > Does have a stent likely impacted by electrolyte abnormalities and dehydration as above. - Correct abnormalities as above - PT and OT eval and treat  Hypertension - Holding home Lasix as above and also will continue to hold home hydralazine as her blood pressure was normal on arrival. - Continue with home carvedilol  CAD CHF > Last echo in 2020 with EF 20% and normal RV function.  Volume down on presentation with some improvement after holding home Lasix. - Continue home carvedilol - Holding home Lasix - I's and O's and daily weights  CKD 3B/4 > History shows diagnosis of CKD 3, placed on EGFR here patient's kidney function with baseline creatinine is more consistent with CKD 4. > Currently creatinine stable at around 1.9-2. - Trend renal function and electrolytes  Anemia > Hemoglobin stable at 7.7, last lab we have in our system is from a year ago at 8. > General trend CBC with administration of IV fluids to make it hemodiluted. -Trend CBC -Type and screen  OSA - Continue CPAP  History of bladder cancer - Noted  DVT prophylaxis: Lovenox Code Status:   Full Family Communication:  Updated at bedside Disposition Plan:   Patient is from:  Home  Anticipated DC to:  Home  Anticipated DC date:  2 to 3 days  Anticipated DC barriers: None  Consults called:  None Admission status:  Inpatient, progressive  Severity of Illness: The appropriate patient status for this patient is INPATIENT. Inpatient status is judged to be reasonable and necessary in order to provide the  required intensity of service to ensure the patient's safety. The patient's presenting symptoms, physical exam findings, and initial radiographic and laboratory data in the context of their chronic comorbidities is felt to place them at high risk for further clinical deterioration. Furthermore, it is not anticipated that the patient will be medically stable for discharge from the hospital within 2 midnights of admission.   * I certify that at the point of admission it is my clinical judgment that the patient will require inpatient hospital care spanning beyond 2 midnights from the point of admission due to high intensity of service, high risk for further deterioration and high frequency of surveillance required.Marcelyn Bruins MD [facility]  How to contact the [facility] Attending or Consulting provider [place] or covering provider during after hours [place], for this patient?   Check the care team in [facility] and look for a) attending/consulting TR[facility] provider listed and b) the [facility] team listed Log into [email or web address] and use 's universal password to access. If you do not have the password, please contact the hospital operator. Locate the [facility] provider you are looking for under [facility] and page to a number that you can be directly reached. If you still have difficulty reaching the provider, please page the [facility] (Director on Call) for the  Hospitalists listed on amion for assistance.  07/26/2022, 8:05 PM

## 2022-07-26 NOTE — ED Notes (Signed)
Pt provided morning snacks, daughter at bedside. Will continue to monitor.

## 2022-07-26 NOTE — Progress Notes (Addendum)
Pt said she did not need CPAP machine to sleep. Pt knows to contact RT if she changes her mind, vitals stable.

## 2022-07-26 NOTE — ED Notes (Signed)
Pt care taken, pt resting with family at bedside.

## 2022-07-26 NOTE — Progress Notes (Signed)
Transferring facility: [facility] Requesting provider:  Dr. Philip Aspen (EDP at  [facility]) Reason for transfer: admission for further evaluation and management of  acute hyponatremia.    88 year old female with medical history notable for chronic systolic heart failure (EF 20% per echo in 2020), stage IV CKD with reported baseline Cr of approximately 2.0, who presented to [facility] ED on 07/25/2022 complaining of generalized weakness, without any acute focal neurologic deficits. No seizures.  No reported chest pain or shortness of breath.  Patient reportedly at baseline mental status. No reported recent trauma, and no known chronic pulmonary pathology.  She had presented to her PCP on Monday, 07/23/2022 with complaint of generalized weakness, at which time labs revealed serum sodium level of 121, compared to 131 on July 19, 2022.  Additionally, labs on 07/23/2022 were notable for acute kidney injury, with creatinine of 2.8 relative to baseline of 2.0.  PCP felt that the patient was intravascularly dry, and asked that the patient hold her home Lasix, and increase water consumption.   Patient has been off of Lasix in the interval, and while she remains asymptomatic from a respiratory standpoint, presented to [facility] ED this evening for perpetuation of generalized weakness, at which time updated serum sodium found to be 118, along with interval resolution of AKI, with creatinine back to her baseline of around 2.0.  EDP feels that, clinically, patient appears dry.  Serum/urine osmolality ordered, with results currently pending.  Additional pending labs include urinalysis, random urine sodium.  Started on gentle IV fluids, with repeat BMP ordered.    Subsequently, I accepted this patient for transfer for inpatient admission to a pcu bed at [facility] for further work-up and management of acute hyponatremia.       Check [email or web address] for on-call coverage.   Nursing staff, Please call [place] number  on Amion as soon as patient's arrival, so appropriate admitting provider can evaluate the pt.     Babs Bertin, DO Hospitalist

## 2022-07-27 DIAGNOSIS — E871 Hypo-osmolality and hyponatremia: Secondary | ICD-10-CM | POA: Diagnosis not present

## 2022-07-27 DIAGNOSIS — D649 Anemia, unspecified: Secondary | ICD-10-CM | POA: Diagnosis not present

## 2022-07-27 LAB — BASIC METABOLIC PANEL
Anion gap: 10 (ref 5–15)
Anion gap: 9 (ref 5–15)
Anion gap: 9 (ref 5–15)
BUN: 66 mg/dL — ABNORMAL HIGH (ref 8–23)
BUN: 65 mg/dL — ABNORMAL HIGH (ref 8–23)
BUN: 61 mg/dL — ABNORMAL HIGH (ref 8–23)
CO2: 14 mmol/L — ABNORMAL LOW (ref 22–32)
CO2: 13 mmol/L — ABNORMAL LOW (ref 22–32)
CO2: 14 mmol/L — ABNORMAL LOW (ref 22–32)
Calcium: 8.4 mg/dL — ABNORMAL LOW (ref 8.9–10.3)
Calcium: 7.9 mg/dL — ABNORMAL LOW (ref 8.9–10.3)
Calcium: 7.8 mg/dL — ABNORMAL LOW (ref 8.9–10.3)
Chloride: 99 mmol/L (ref 98–111)
Chloride: 102 mmol/L (ref 98–111)
Chloride: 104 mmol/L (ref 98–111)
Creatinine, Ser: 2 mg/dL — ABNORMAL HIGH (ref 0.44–1.00)
Creatinine, Ser: 1.87 mg/dL — ABNORMAL HIGH (ref 0.44–1.00)
Creatinine, Ser: 1.86 mg/dL — ABNORMAL HIGH (ref 0.44–1.00)
GFR, Estimated: 23 mL/min — ABNORMAL LOW (ref >60)
GFR, Estimated: 25 mL/min — ABNORMAL LOW (ref >60)
GFR, Estimated: 25 mL/min — ABNORMAL LOW (ref >60)
Glucose, Bld: 107 mg/dL — ABNORMAL HIGH (ref 70–99)
Glucose, Bld: 93 mg/dL (ref 70–99)
Glucose, Bld: 151 mg/dL — ABNORMAL HIGH (ref 70–99)
Potassium: 5.3 mmol/L — ABNORMAL HIGH (ref 3.5–5.1)
Potassium: 5.4 mmol/L — ABNORMAL HIGH (ref 3.5–5.1)
Potassium: 4.7 mmol/L (ref 3.5–5.1)
Sodium: 123 mmol/L — ABNORMAL LOW (ref 135–145)
Sodium: 124 mmol/L — ABNORMAL LOW (ref 135–145)
Sodium: 127 mmol/L — ABNORMAL LOW (ref 135–145)

## 2022-07-27 LAB — RETICULOCYTES
Immature Retic Fract: 4.3 % (ref 2.3–15.9)
RBC.: 2.25 MIL/uL — ABNORMAL LOW (ref 3.87–5.11)
Retic Count, Absolute: 45.7 10*3/uL (ref 19.0–186.0)
Retic Ct Pct: 2 % (ref 0.4–3.1)

## 2022-07-27 LAB — FERRITIN: Ferritin: 293 ng/mL (ref 11–307)

## 2022-07-27 LAB — VITAMIN B12: Vitamin B-12: 472 pg/mL (ref 180–914)

## 2022-07-27 LAB — CBC
HCT: 20.5 % — ABNORMAL LOW (ref 36.0–46.0)
Hemoglobin: 7.4 g/dL — ABNORMAL LOW (ref 12.0–15.0)
MCH: 33.8 pg (ref 26.0–34.0)
MCHC: 36.1 g/dL — ABNORMAL HIGH (ref 30.0–36.0)
MCV: 93.6 fL (ref 80.0–100.0)
Platelets: 172 10*3/uL (ref 150–400)
RBC: 2.19 MIL/uL — ABNORMAL LOW (ref 3.87–5.11)
RDW: 12.5 % (ref 11.5–15.5)
WBC: 5.2 10*3/uL (ref 4.0–10.5)
nRBC: 0 % (ref 0.0–0.2)

## 2022-07-27 LAB — IRON AND TIBC
Iron: 76 ug/dL (ref 28–170)
Saturation Ratios: 32 % — ABNORMAL HIGH (ref 10.4–31.8)
TIBC: 241 ug/dL — ABNORMAL LOW (ref 250–450)
UIBC: 165 ug/dL

## 2022-07-27 LAB — FOLATE: Folate: 16.4 ng/mL (ref >5.9)

## 2022-07-27 MED ORDER — SODIUM ZIRCONIUM CYCLOSILICATE 10 G PO PACK
10.0000 g | PACK | Freq: Two times a day (BID) | ORAL | Status: DC
Start: 2022-07-27 — End: 2022-07-27

## 2022-07-27 MED ORDER — SODIUM ZIRCONIUM CYCLOSILICATE 10 G PO PACK
10.0000 g | PACK | ORAL | Status: AC
  Administered 2022-07-27: 10 g via ORAL
  Filled 2022-07-27: qty 1

## 2022-07-27 MED ORDER — POTASSIUM CHLORIDE CRYS ER 20 MEQ PO TBCR
40.0000 meq | EXTENDED_RELEASE_TABLET | Freq: Once | ORAL | Status: DC
Start: 2022-07-27 — End: 2022-07-27
  Filled 2022-07-27: qty 2

## 2022-07-27 NOTE — Evaluation (Signed)
Physical Therapy Evaluation Patient Details Name: Paige Cook MRN: [id number] DOB: [birth year] Today's Date: 07/27/2022  History of Present Illness  88 yo female presents to ED on 8/9 with hyponatremia, fatigue. PMH includes hyperlipidemia, CAD, OSA, hypertension, CKD 3-4, CHF, PAD, bladder cancer.  Clinical Impression   Pt presents with generalized weakness, fair standing balance, and decreased activity tolerance vs baseline. Pt to benefit from acute PT to address deficits. Pt ambulated good hallway distance with use of rollator and supervision for safety. Post-gait, pt with HR elevation to 130 bpm but recovered back to baseline quickly, pt asymptomatic. PT recommending continue HHPT. PT to progress mobility as tolerated, and will continue to follow acutely.         Recommendations for follow up therapy are one component of a multi-disciplinary discharge planning process, led by the attending physician.  Recommendations may be updated based on patient status, additional functional criteria and insurance authorization.  Follow Up Recommendations Home health PT      Assistance Recommended at Discharge Intermittent Supervision/Assistance  Patient can return home with the following  Assistance with cooking/housework;Help with stairs or ramp for entrance;A little help with walking and/or transfers    Equipment Recommendations None recommended by PT  Recommendations for Other Services       Functional Status Assessment Patient has had a recent decline in their functional status and demonstrates the ability to make significant improvements in function in a reasonable and predictable amount of time.     Precautions / Restrictions Precautions Precautions: Fall Restrictions Weight Bearing Restrictions: No      Mobility  Bed Mobility Overal bed mobility: Needs Assistance Bed Mobility: Supine to Sit, Sit to Supine     Supine to sit: Supervision, HOB elevated Sit to supine:  Supervision, HOB elevated        Transfers Overall transfer level: Needs assistance Equipment used: Rollator (4 wheels) Transfers: Sit to/from Stand Sit to Stand: Supervision           General transfer comment: for safety, min cuing for proper hand placement    Ambulation/Gait Ambulation/Gait assistance: Supervision Gait Distance (Feet): 300 Feet Assistive device: Rollator (4 wheels) Gait Pattern/deviations: Step-through pattern, Decreased stride length Gait velocity: decr     General Gait Details: cues for upright posture as able, hallway navigation  Stairs            Wheelchair Mobility    Modified Rankin (Stroke Patients Only)       Balance Overall balance assessment: Needs assistance Sitting-balance support: No upper extremity supported, Feet supported Sitting balance-Leahy Scale: Good     Standing balance support: Bilateral upper extremity supported, During functional activity Standing balance-Leahy Scale: Fair                 High Level Balance Comments: WFL horizontal/vertical head turns, directional changes             Pertinent Vitals/Pain Pain Assessment Pain Assessment: Faces Faces Pain Scale: Hurts a little bit Pain Location: back, chronic Pain Descriptors / Indicators: Aching Pain Intervention(s): Limited activity within patient's tolerance, Monitored during session, Repositioned    Home Living Family/patient expects to be discharged to:: Private residence Living Arrangements: Alone Available Help at Discharge: Family (family checks on pt for a few hours a day, planning on hiring an aide 2x/week) Type of Home: House Home Access: Stairs to enter   [employer] of Steps: 3 Alternate Level Stairs-Number of Steps: 3 steps to get down to the den and  kitchen, flight of stairs to second floor. Pt's bedroom is on main level Home Layout: Multi-level Home Equipment: Rollator (4 wheels);Cane - single point;Shower seat       Prior Function Prior Level of Function : Needs assist             Mobility Comments: pt uses rollator or cane for ambulation, reports walking 1/2 mile outside with family frequently during the week ADLs Comments: pt's daughters prepare meals, pt states she can dress and bathe self     Hand Dominance   Dominant Hand: Right    Extremity/Trunk Assessment   Upper Extremity Assessment Upper Extremity Assessment: Defer to OT evaluation    Lower Extremity Assessment Lower Extremity Assessment: Generalized weakness    Cervical / Trunk Assessment Cervical / Trunk Assessment: Other exceptions;Kyphotic Cervical / Trunk Exceptions: scoli  Communication   Communication: No difficulties  Cognition Arousal/Alertness: Awake/alert Behavior During Therapy: WFL for tasks assessed/performed Overall Cognitive Status: Within Functional Limits for tasks assessed                                          General Comments General comments (skin integrity, edema, etc.): HR alarming to 130 bpm at end of gait, recovers quickly to low 100s    Exercises     Assessment/Plan    PT Assessment Patient needs continued PT services  PT Problem List Decreased mobility;Decreased strength;Decreased balance;Decreased knowledge of use of DME;Pain;Decreased activity tolerance       PT Treatment Interventions DME instruction;Therapeutic activities;Gait training;Therapeutic exercise;Patient/family education;Balance training;Functional mobility training;Stair training    PT Goals (Current goals can be found in the Care Plan section)  Acute Rehab PT Goals PT Goal Formulation: With patient Time For Goal Achievement: 08/10/22 Potential to Achieve Goals: Good    Frequency Min 3X/week     Co-evaluation               AM-PAC PT "6 Clicks" Mobility  Outcome Measure Help needed turning from your back to your side while in a flat bed without using bedrails?: A Little Help needed  moving from lying on your back to sitting on the side of a flat bed without using bedrails?: A Little Help needed moving to and from a bed to a chair (including a wheelchair)?: A Little Help needed standing up from a chair using your arms (e.g., wheelchair or bedside chair)?: A Little Help needed to walk in hospital room?: A Little Help needed climbing 3-5 steps with a railing? : A Little 6 Click Score: 18    End of Session   Activity Tolerance: Patient tolerated treatment well Patient left: in bed;with call bell/phone within reach;with family/visitor present Nurse Communication: Mobility status PT Visit Diagnosis: Other abnormalities of gait and mobility (R26.89);Muscle weakness (generalized) (M62.81)    Time: 5573-2202 PT Time Calculation (min) (ACUTE ONLY): 20 min   Charges:   PT Evaluation $PT Eval Low Complexity: 1 Low        Deborha Moseley S, PT DPT Acute Rehabilitation Services Pager [phone]  Office [phone]   Lemmon E Ruffin Pyo 07/27/2022, 9:52 AM

## 2022-07-27 NOTE — Progress Notes (Signed)
  Transition of Care [facility]) Screening Note   Patient Details  Name: Paige Cook Date of Birth: 1928-07-20   Transition of Care [facility]) CM/SW Contact:    Cyndi Bender, RN Phone Number: 07/27/2022, 9:02 AM    Transition of Care Department [facility]) has reviewed patient and no TOC needs have been identified at this time. We will continue to monitor patient advancement through interdisciplinary progression rounds. If new patient transition needs arise, please place a TOC consult.

## 2022-07-27 NOTE — Progress Notes (Signed)
PROGRESS NOTE    Paige Cook  HYH:[id number] DOB: [birth year] DOA: 07/25/2022 PCP: Burnard Bunting, MD   Chief Complaint  Patient presents with   abnormal labs    Brief Narrative:   Paige Cook is a 88 y.o. female with medical history significant of hyperlipidemia, CAD, OSA, hypertension, CKD 3-4, CHF, PAD presenting with generalized weakness.  Patient said generalized weakness going on for about a week.  She saw her PCP on 8/7 for the same complaint and had labs checked and was noted to have a sodium of 121 with previous checked less than a week prior at 131 and her creatinine was elevated to 2.8 with a baseline previous to being around 2.  PCP diagnosed her with hyponatremia and AKI secondary to dehydration/overdiuresis.  Patient was told to hold her home Lasix and increase her water intake.  She follows instructions for 2 days but due to continued generalized weakness she is presented to the ED for further evaluation.  She denies fevers, chills, chest pain, shortness of breath, abdominal pain, constipation, diarrhea, nausea, vomiting.   ED Course: Vital signs in the ED significant for respirate in the teens to 34s, blood pressure in the 820U 015I systolic.  Lab workup included BMP with sodium initially 118, recheck as of this morning and improved to 121, chloride 96, potassium 5.6, recheck over this morning had improved to 5.3, bicarb 16, BUN 69, creatinine stable at 2, glucose 120s, calcium 8.5.  CBC with hemoglobin stable at 10.7 last check in our system was at 8 a year ago.  Urinalysis with leukocytes and bacteria.  Chest x-ray showed stable cardiomegaly only.  Patient received Tylenol aspirin, ceftriaxone, carvedilol, 500 cc bolus and started on a rate of 40 cc an hour in the ED.   Patient accepted for transfer from [place] to [facility] for treatment and workup of her hyponatremia at around 2 AM this morning.  Patient arrived around 6:30 PM.  As above did have BMP  trended overnight but last check was at 8 AM (around 10 hours ago) at that time as above sodium had improved from 1 18-1 21 and creatinine remained stable and potassium improved from 5.6-5.3.  Urine sodium checked came back at 37 and urine awesome's was low normal.  Serum awesome's appropriately low at 272.  Assessment & Plan:   Principal Problem:   Acute hyponatremia Active Problems:   Hypercholesteremia   CAD (coronary artery disease)   OSA (obstructive sleep apnea)   CKD (chronic kidney disease) stage 4, GFR 15-29 ml/min (HCC)   HTN (hypertension)   Chronic systolic CHF (congestive heart failure) (HCC)   Anemia   Malignant neoplasm of bladder, unspecified (HCC)   Generalized weakness     Acute hyponatremia -To be acute on chronic, low baseline hyponatremia at 131, significantly low on presentation at 118. -Appears to be volume depletion and dehydration -Improving with IV fluids, continue with normal saline at 75 cc/h.  Generalized weakness - Does have a stent likely impacted by electrolyte abnormalities and dehydration as above. - Correct abnormalities as above - PT and OT eval and treat  Hypertension - Holding home Lasix as above and also will continue to hold home hydralazine as her blood pressure was normal on arrival. - Continue with home carvedilol  CAD CHF > Last echo in 2020 with EF 20% and normal RV function.  Volume down on presentation with some improvement after holding home Lasix. - Continue home carvedilol - Holding home  Lasix - I's and O's and daily weights   CKD 3B/4 > History shows diagnosis of CKD 3, placed on EGFR here patient's kidney function with baseline creatinine is more consistent with CKD 4. > Currently creatinine stable at around 1.9-2. - Trend renal function and electrolytes   Anemia of chronic kidney disease > Hemoglobin stable at 7.7, last lab we have in our system is from a year ago at 56. -Panel has been obtained ferritin and iron  levels are reassuring, no evidence of iron deficiency -B12 level is borderline, will start on p.o. supplements   OSA - Continue CPAP  History of bladder cancer - Noted  DVT prophylaxis: SCD Code Status: Full Family Communication: D/W daughter at bedside Disposition:   Status is: Inpatient    Consultants:  None   Subjective:  Reports generalized weakness, fatigue, appetite has improved  Objective: Vitals:   07/27/22 0000 07/27/22 0311 07/27/22 0800 07/27/22 1241  BP: 123/76 (!) 146/63 126/78 (!) 122/52  Pulse: 87  95 69  Resp: _0 Temp: 98.2 F (36.8 C) 97.6 F (36.4 C) 97.9 F (36.6 C) 97.8 F (36.6 C)  TempSrc: Oral Oral  Oral  SpO2: 98% 96% 98% 96%  Weight:  48.9 kg    Height:        Intake/Output Summary (Last 24 hours) at 07/27/2022 1514 Last data filed at 07/27/2022 0600 Gross per 24 hour  Intake 585.45 ml  Output --  Net 585.45 ml   Filed Weights   07/25/22 1958 07/27/22 0311  Weight: 45.4 kg 48.9 kg    Examination:  Awake Alert, Oriented X 3, No new F.N deficits, Normal affect,frail Symmetrical Chest wall movement, Good air movement bilaterally, CTAB RRR,No Gallops,Rubs or new Murmurs, No Parasternal Heave +ve B.Sounds, Abd Soft, No tenderness, No rebound - guarding or rigidity. No Cyanosis, Clubbing or edema, No new Rash or bruise      Data Reviewed: I have personally reviewed following labs and imaging studies  CBC: Recent Labs  Lab 07/25/22 2009 07/26/22 1851 07/27/22 0634  WBC 5.6 5.8 5.2  HGB 7.7* 7.7* 7.4*  HCT 21.9* 22.7* 20.5*  MCV 93.2 97.0 93.6  PLT 166 153 062    Basic Metabolic Panel: Recent Labs  Lab 07/25/22 2009 07/26/22 0200 07/26/22 1851 07/26/22 2235 07/27/22 0233 07/27/22 0634 07/27/22 1137  NA 118*   < > 122* 122* 123* 124* 127*  K 5.6*   < > 5.5* 5.0 5.3* 5.4* 4.7  CL 92*   < > 99 99 99 102 104  CO2 16*   < > 15* 16* 14* 13* 14*  GLUCOSE 121*   < > 145* 141* 107* 93 151*  BUN 69*   < > 61*  63* 66* 65* 61*  CREATININE 2.00*   < > 1.74* 2.04* 2.00* 1.87* 1.86*  CALCIUM 8.5*   < > 8.1* 8.0* 8.4* 7.9* 7.8*  MG 2.0  --   --   --   --   --   --    < > = values in this interval not displayed.    GFR: Estimated Creatinine Clearance: 14.9 mL/min (A) (by C-G formula based on SCr of 1.86 mg/dL (H)).  Liver Function Tests: No results for input(s): "AST", "ALT", "ALKPHOS", "BILITOT", "PROT", "ALBUMIN" in the last 168 hours.  CBG: No results for input(s): "GLUCAP" in the last 168 hours.   No results found for this or any previous visit (from the past 240 hour(s)).  Radiology Studies: DG Chest 1 View  Result Date: 07/25/2022 CLINICAL DATA:  Congestive heart failure EXAM: CHEST  1 VIEW COMPARISON:  None Available. FINDINGS: Left suprahilar nodular opacity likely represents vascular shadow, similar to CT examination of 12/22/2009. Lungs are otherwise clear. No pneumothorax or pleural effusion. Mild cardiomegaly is stable. Pulmonary vascularity is normal. No acute bone abnormality. IMPRESSION: Stable cardiomegaly. No radiographic evidence of acute cardiopulmonary disease. Electronically Signed   By: Fidela Salisbury M.D.   On: 07/25/2022 22:02        Scheduled Meds:  aspirin  81 mg Oral Q breakfast   carvedilol  6.25 mg Oral BID WC   saccharomyces boulardii  250 mg Oral Daily   sodium chloride flush  3 mL Intravenous Q12H   Continuous Infusions:  sodium chloride 50 mL/hr at 07/27/22 0833     LOS: 1 day        Phillips Climes, MD [facility]   To contact the attending provider between 7A-7P or the covering provider during after hours 7P-7A, please log into the web site [email or web address] and access using universal [place] password for that web site. If you do not have the password, please call the hospital operator.  07/27/2022, 3:14 PM

## 2022-07-27 NOTE — Evaluation (Signed)
Occupational Therapy Evaluation and Discharge Patient Details Name: Paige Cook MRN: [id number] DOB: [birth year] Today's Date: 07/27/2022   History of Present Illness 88 yo female presents to ED on 8/9 with hyponatremia, fatigue. PMH includes hyperlipidemia, CAD, OSA, hypertension, CKD 3-4, CHF, PAD, bladder cancer.   Clinical Impression   Pt is functioning near her baseline, currently set up to supervision. She has all necessary equipment in her home and her daughters provide excellent care of pt and plan to stay with her initially upon return home. Recommend ADLs with with nursing staff.      Recommendations for follow up therapy are one component of a multi-disciplinary discharge planning process, led by the attending physician.  Recommendations may be updated based on patient status, additional functional criteria and insurance authorization.   Follow Up Recommendations  No OT follow up    Assistance Recommended at Discharge Frequent or constant Supervision/Assistance (initially)  Patient can return home with the following A little help with walking and/or transfers;Assistance with cooking/housework;Direct supervision/assist for medications management;Direct supervision/assist for financial management;Assist for transportation;Help with stairs or ramp for entrance;A little help with bathing/dressing/bathroom    Functional Status Assessment  Patient has had a recent decline in their functional status and demonstrates the ability to make significant improvements in function in a reasonable and predictable amount of time.  Equipment Recommendations  None recommended by OT    Recommendations for Other Services       Precautions / Restrictions Precautions Precautions: Fall Restrictions Weight Bearing Restrictions: No      Mobility Bed Mobility Overal bed mobility: Needs Assistance Bed Mobility: Supine to Sit, Sit to Supine     Supine to sit: Supervision, HOB elevated Sit to  supine: Supervision, HOB elevated        Transfers Overall transfer level: Needs assistance Equipment used: Rollator (4 wheels) Transfers: Sit to/from Stand Sit to Stand: Supervision                  Balance Overall balance assessment: Needs assistance                                         ADL either performed or assessed with clinical judgement   ADL                                         General ADL Comments: supervision     Vision Ability to See in Adequate Light: 0 Adequate Patient Visual Report: No change from baseline       Perception     Praxis      Pertinent Vitals/Pain Pain Assessment Pain Assessment: Faces Faces Pain Scale: Hurts a little bit Pain Location: back, chronic Pain Descriptors / Indicators: Aching Pain Intervention(s): Monitored during session, Repositioned     Hand Dominance Right   Extremity/Trunk Assessment Upper Extremity Assessment Upper Extremity Assessment: Overall WFL for tasks assessed   Lower Extremity Assessment Lower Extremity Assessment: Defer to PT evaluation   Cervical / Trunk Assessment Cervical / Trunk Assessment: Kyphotic;Other exceptions (scoliosis) Cervical / Trunk Exceptions: scoli   Communication Communication Communication: HOH   Cognition Arousal/Alertness: Awake/alert Behavior During Therapy: WFL for tasks assessed/performed Overall Cognitive Status: Within Functional Limits for tasks assessed  General Comments: pt repeats herself     General Comments  HR alarming to 130 bpm at end of gait, recovers quickly to low 100s    Exercises     Shoulder Instructions      Home Living Family/patient expects to be discharged to:: Private residence Living Arrangements: Alone Available Help at Discharge: Family;Available PRN/intermittently Type of Home: House Home Access: Stairs to enter Entrance Stairs-Number of Steps:  3   Home Layout: Multi-level Alternate Level Stairs-Number of Steps: 3 steps to get down to the [employer], flight of stairs to second floor. Pt's bedroom is on main level   Bathroom Shower/Tub: Occupational psychologist: Standard     Home Equipment: Rollator (4 wheels);Cane - single point;Shower seat;Grab bars - toilet;Grab bars - tub/shower;Hand held shower head          Prior Functioning/Environment Prior Level of Function : Needs assist             Mobility Comments: pt uses rollator or cane for ambulation, reports walking 1/2 mile outside with family frequently during the week ADLs Comments: pt's daughters prepare evening meals and cleaning, pt states she can dress and bathe self and complete light meal prep and light housekeeping        OT Problem List:        OT Treatment/Interventions:      OT Goals(Current goals can be found in the care plan section)    OT Frequency:      Co-evaluation              AM-PAC OT "6 Clicks" Daily Activity     Outcome Measure Help from another person eating meals?: None Help from another person taking care of personal grooming?: A Little Help from another person toileting, which includes using toliet, bedpan, or urinal?: A Little Help from another person bathing (including washing, rinsing, drying)?: A Little Help from another person to put on and taking off regular upper body clothing?: None Help from another person to put on and taking off regular lower body clothing?: A Little 6 Click Score: 20   End of Session Equipment Utilized During Treatment: Rolling walker (2 wheels)  Activity Tolerance: Patient tolerated treatment well Patient left: in bed;with call bell/phone within reach;with nursing/sitter in room;with family/visitor present  OT Visit Diagnosis: Other abnormalities of gait and mobility (R26.89)                Time: 2876-8115 OT Time Calculation (min): 22 min Charges:  OT General Charges $OT  Visit: 1 Visit OT Evaluation $OT Eval Low Complexity: 1 Low  Paige Cook, Paige Cook Acute Rehabilitation Services Office: [phone]   Paige Cook 07/27/2022, 11:41 AM

## 2022-07-27 NOTE — Progress Notes (Unsigned)
Cardiology Office Note:    Date:  07/31/2022   ID:  Paige Cook, DOB Sep 22, 1928, MRN [id number]  PCP:  Burnard Bunting, MD  Cardiologist:  Liandro Thelin Martinique, MD  Electrophysiologist:  None   Referring MD: Burnard Bunting, MD   Chief Complaint  Patient presents with   Congestive Heart Failure    History of Present Illness:    Paige Cook is a 88 y.o. female with a hx of nonischemic cardiomyopathy, PACs, CAD, hyperlipidemia, and OSA on CPAP.  She is seen for follow up CHF. Previous cardiac catheterization on 01/10/2010 showed EF 30%, 40 to 50% proximal LAD lesion, 30% mid LAD lesion, otherwise normal left circumflex, and left main and RCA.  Patient had EF of 40% in 2012.  Subsequent echocardiogram in 2013 showed EF of 45%.  He has history of very frequent PACs on the Holter monitor however no atrial fibrillation was identified.    Patient was seen in February 2020 and noted to have mild edema however otherwise asymptomatic.  In August he presented with symptom of PND.  Her Lasix was increased.  Echocardiogram obtained showed decreased LVEF to 20% with moderate MR.  It was noted she was noncompliant with CPAP therapy.  Symptoms did not improve with increased Lasix.   Titration of medication was limited by CKD and her low BP.  Due to worsening renal function in August, Lasix was reduced to 3 times a week.  She later reported increased edema and shortness of breath.  Lasix was eventually increased back to daily dosing and symptoms improved.   She was admitted in June 2022 with C difficile colitis. Treated with antibiotics.  She was admitted August 10 with generalized weakness and hyponatremia. Hydrated with NS and lasix held.   Since DC she notes some increase in SOB. Notes mild ankle swelling. Was seen by primary care yesterday and serum sodium level increased to 126 and CXR showed increased pulmonary edema and some effusions. She is not weighing at home regularly. Did take lasix yesterday and  today.    Past Medical History:  Diagnosis Date   Acute on chronic systolic (congestive) heart failure ([place]) 08/14/2019   AKI (acute kidney injury) ([place]) 05/22/2021   Allergic rhinitis    Anemia    Arthritis    Bladder cancer ([place])    giant tumor   CAD (coronary artery disease)    Cardiac arrhythmia    benign   Carotid bruit    left   CHF (congestive heart failure) ([place])    CHF with systolic dysfuntion ; dilated nonishemic cardiomyopathy   Colon cancer ([place]) 1989   Colon polyps    Diverticulosis    Epistaxis    Female bladder prolapse    Gallstones    Heart failure (HCC)    Hypercholesteremia    Hyponatremia    Probably secondary to her CHF   OSA (obstructive sleep apnea) 04/29/2012   SOB (shortness of breath)    Sometimes at night    Past Surgical History:  Procedure Laterality Date   ABDOMINAL HYSTERECTOMY     BLADDER SURGERY  2013   cancer removed   bladder tact     CARDIAC CATHETERIZATION     non obstruction CAD   CHOLECYSTECTOMY     COLECTOMY     sigmoid 1989 cancer   COLONOSCOPY      Current Medications: Current Meds  Medication Sig   carvedilol (COREG) 6.25 MG tablet TAKE 1 TABLET BY MOUTH TWICE A DAY  WITH MEALS   Cholecalciferol (VITAMIN D3) 50 MCG (2000 UT) TABS Take 50 mcg by mouth.   cyanocobalamin (VITAMIN B12) 500 MCG tablet Take 1 tablet (500 mcg total) by mouth daily.   furosemide (LASIX) 40 MG tablet Take 20 mg by mouth daily in the afternoon.   saccharomyces boulardii (FLORASTOR) 250 MG capsule Take 250 mg by mouth daily.   vitamin C (ASCORBIC ACID) 250 MG tablet Take 500 mg by mouth daily with breakfast.    [DISCONTINUED] hydrALAZINE (APRESOLINE) 10 MG tablet TAKE 1 TABLET BY MOUTH THREE TIMES A DAY     Allergies:   Penicillins and Tramadol   Social History   Socioeconomic History   Marital status: Married    Spouse name: Not on file   Number of children: 4   Years of education: Not on file   Highest education level: Not on file   Occupational History   Occupation: retired Paramedic  Tobacco Use   Smoking status: Never   Smokeless tobacco: Never  Substance and Sexual Activity   Alcohol use: No   Drug use: No   Sexual activity: Not Currently  Other Topics Concern   Not on file  Social History Narrative   Married, 1 son and 3 daughters.    One coffee a day no alcohol no tobacco   05/05/2015   Social Determinants of Health   Financial Resource Strain: Not on file  Food Insecurity: Not on file  Transportation Needs: Not on file  Physical Activity: Not on file  Stress: Not on file  Social Connections: Not on file     Family History: The patient's family history includes Heart attack in her father and mother; Heart disease in her father; Heart failure (age of onset: 84) in her sister. There is no history of Stroke.  ROS:   Please see the history of present illness.     All other systems reviewed and are negative.  EKGs/Labs/Other Studies Reviewed:    The following studies were reviewed today:  Cath 01/10/2010      Echo 07/30/2019 IMPRESSIONS      1. The left ventricle has a visually estimated ejection fraction of 20%. The cavity size was severely dilated. Left ventricular diastolic Doppler parameters are consistent with restrictive filling. Elevated left ventricular end-diastolic pressure Left  ventrical global hypokinesis without regional wall motion abnormalities.  2. The right ventricle has normal systolic function. The cavity was normal. There is no increase in right ventricular wall thickness.  3. Left atrial size was moderately dilated.  4. Right atrial size was mildly dilated.  5. Moderate thickening of the mitral valve leaflet. Mild calcification of the mitral valve leaflet. There is mild mitral annular calcification present. Mitral valve regurgitation is moderate by color flow Doppler.  6. The aortic valve is tricuspid. Moderate thickening of the aortic valve. Sclerosis without  any evidence of stenosis of the aortic valve. Aortic valve regurgitation is mild by color flow Doppler.  7. The aorta is normal unless otherwise noted.  EKG:  EKG is not done today.    Recent Labs: 07/25/2022: Magnesium 2.0 07/28/2022: BUN 61; Creatinine, Ser 1.91; Hemoglobin 7.5; Platelets 170; Potassium 5.1; Sodium 128  Recent Lipid Panel    Component Value Date/Time   CHOL 164 01/28/2020 0940   TRIG 64 01/28/2020 0940   HDL 60 01/28/2020 0940   CHOLHDL 2.7 01/28/2020 0940   CHOLHDL 2.2 11/03/2015 1042   VLDL 15 11/03/2015 1042   LDLCALC 91 01/28/2020 0940  LDLDIRECT 152.9 03/06/2013 0924   Dated 06/22/19: cholesterol 178, triglycerides 51, HDL 64, LDL 104. LFTs normal. Hgb 11. TSH normal Dated 12/24/19: creatinine 1.89. potassium 5.2.  Dated 06/27/20: BUN 88, creatinine 1.6. cholesterol 177, triglycerides 73, HDL 70, LDL 92. Hgb 10.8. plt 177K. TSH normal. Dated 07/20/21: cholesterol 175, triglycerides 64, HDL 52, LDL 110. BUN 94, creatinine 1.97. otherwise CMET normal. Dated 07/16/22: cholesterol 174, triglycerides 85, HDL 66, LDL 91. BUN 69, TSh normal.   Physical Exam:    VS:  BP (!) 98/59   Pulse 72   Ht '5\' 4"'$  (1.626 m)   Wt 111 lb 12.8 oz (50.7 kg)   SpO2 97%   BMI 19.19 kg/m     Wt Readings from Last 3 Encounters:  07/31/22 111 lb 12.8 oz (50.7 kg)  07/27/22 107 lb 12.9 oz (48.9 kg)  02/27/22 95 lb (43.1 kg)     GEN:  Elderly, frail WF  walks with a walker.  HEENT: Normal NECK: + JVD; No carotid bruits Back: kyphoscoliosis CARDIAC: RRR, no murmurs, rubs, gallops RESPIRATORY:  bibasilar rales.  ABDOMEN: Soft, non-tender, non-distended MUSCULOSKELETAL:  No ankle edema; marked scoliosis SKIN: Warm and dry NEUROLOGIC:  Alert and oriented x 3 PSYCHIATRIC:  Normal affect   ASSESSMENT:    1. Chronic systolic HF (heart failure) ([place])   2. Essential hypertension   3. Hyperlipidemia LDL goal <70   4. Coronary artery disease involving native coronary artery of native  heart without angina pectoris   5. Stage 3b chronic kidney disease ([place])      PLAN:    In order of problems listed above:  Chronic systolic heart failure/ nonischemic: EF 20% in 2020. Recent hospitalization with severe hyponatremia. Improved with NS hydration and diuretics held. Now volume overloaded. I reviewed CXR on daughter's phone and clearly has more edema and effusions compared to August 9. Has rales on exam. Recommend continued sodium restriction. Needs fluid restriction with low serum sodium to 1.2 lites/36 oz daily. Will resume lasix 20 mg daily. Daughter reports she is going back to [place] on Friday for repeat CXR and lab. Will follow up here in 2 weeks. I suspect this is an indication that her CHF is progressing.   CAD: nonobstructive by remote cath in 2011. No chest pain  Hyperlipidemia: last LDL 91  5.   CKD stage 3b.    Medication Adjustments/Labs and Tests Ordered: Current medicines are reviewed at length with the patient today.  Concerns regarding medicines are outlined above.  No orders of the defined types were placed in this encounter.  No orders of the defined types were placed in this encounter.   Patient Instructions  Sodium restriction   Restrict fluid intake to less than 1.2 liters a day (36 oz).  Resume lasix 20 mg daily.  Stop taking hydralazine  Will arrange follow up in 2 weeks.   Weigh yourself daily. Let us know if weight goes up 3 lbs or more.      Signed, Yarel Rushlow Martinique, MD  07/31/2022 1:36 PM    [place]

## 2022-07-27 NOTE — TOC Initial Note (Signed)
Transition of Care [facility]) - Initial/Assessment Note    Patient Details  Name: Paige Cook MRN: [id number] Date of Birth: 1928/08/23  Transition [facility] Care [facility]) CM/SW Contact:    Cyndi Bender, RN Phone Number: 07/27/2022, 3:27 PM  Clinical Narrative:                  Damaris Schooner to Anderson Malta, daughter, on the phone regarding transition needs.  Patient lives alone but both of her daughters come to help. Anderson Malta states if needed her and her sister can stay with patient 24/7. Patient has all needed DME. PT recommends home health. Anderson Malta states her mother has used Primary school teacher in the past. Spoke to [place] with [facility] and referral accepted.  Address, Phone number and PCP verified.  TOC will continue to follow for needs.  Expected Discharge Plan: [place] Barriers to Discharge: Continued Medical Work up   Patient Goals and CMS Choice Patient states their goals for this hospitalization and ongoing recovery are:: daughter wants patient to return home CMS Medicare.gov Compare Post Acute Care list provided to:: Patient Represenative (must comment) Choice offered to / list presented to : Patient  Expected Discharge Plan and Services Expected Discharge Plan: [place]   Discharge Planning Services: CM Consult Post Acute Care Choice: [place] arrangements for the past 2 months: Single Family Home                           HH Arranged: PT HH Agency: [place] ([place]) Date HH Agency Contacted: 07/27/22 Time HH Agency Contacted: [phone] Representative spoke with at [place]: [place] Arrangements/Services Living arrangements for the past 2 months: [place] Lives with:: Self Patient language and need for interpreter reviewed:: Yes        Need for Family Participation in Patient Care: Yes (Comment) Care giver support system in place?: Yes (comment) Current home services: DME Criminal Activity/Legal  Involvement Pertinent to Current Situation/Hospitalization: No - Comment as needed  Activities of Daily Living Home Assistive Devices/Equipment: Walker (specify type) ADL Screening (condition at time of admission) Patient's cognitive ability adequate to safely complete daily activities?: Yes Is the patient deaf or have difficulty hearing?: No Does the patient have difficulty seeing, even when wearing glasses/contacts?: No Does the patient have difficulty concentrating, remembering, or making decisions?: No Patient able to express need for assistance with ADLs?: Yes Does the patient have difficulty dressing or bathing?: No Independently performs ADLs?: Yes (appropriate for developmental age) Does the patient have difficulty walking or climbing stairs?: Yes Weakness of Legs: None Weakness of Arms/Hands: None  Permission Sought/Granted Permission sought to share information with : Case Manager       Permission granted to share info w AGENCY: [place]        Emotional Assessment       Orientation: : Fluctuating Orientation (Suspected and/or reported Sundowners) Alcohol / Substance Use: Not Applicable Psych Involvement: No (comment)  Admission diagnosis:  Acute hyponatremia [E87.1] Hyponatremia [E87.1] Patient Active Problem List   Diagnosis Date Noted   Generalized weakness 07/26/2022   Acute hyponatremia 07/25/2022   Diarrhea 05/23/2021   C. difficile diarrhea 99/37/1696   Chronic systolic CHF (congestive heart failure) ([place]) 05/22/2021   Acute-on-chronic kidney injury ([place]) 05/22/2021   PAT (paroxysmal atrial tachycardia) ([place]) 08/14/2019   NICM (nonischemic cardiomyopathy) ([place]) 07/23/2019   Orthopnea 07/23/2019   SBO (small bowel obstruction) ([place])  03/12/2017   CKD (chronic kidney disease) stage 4, GFR 15-29 ml/min (HCC) 03/12/2017   HTN (hypertension) 03/12/2017   PAC (premature atrial contraction) 08/14/2012   PVC's (premature ventricular contractions) 08/14/2012   Heart  palpitations 07/25/2012   OSA (obstructive sleep apnea) 04/29/2012   Anemia 01/31/2012   Malignant neoplasm of bladder, unspecified ([place]) 10/24/2011   Carotid bruit    Hypercholesteremia    CAD (coronary artery disease)    PCP:  Burnard Bunting, MD Pharmacy:   [id number]: [phone]: [phone]    Social Determinants of Health (SDOH) Interventions    Readmission Risk Interventions     No data to display

## 2022-07-28 DIAGNOSIS — N183 Chronic kidney disease, stage 3 unspecified: Secondary | ICD-10-CM

## 2022-07-28 DIAGNOSIS — E871 Hypo-osmolality and hyponatremia: Secondary | ICD-10-CM | POA: Diagnosis not present

## 2022-07-28 DIAGNOSIS — D631 Anemia in chronic kidney disease: Secondary | ICD-10-CM | POA: Diagnosis not present

## 2022-07-28 DIAGNOSIS — I5022 Chronic systolic (congestive) heart failure: Secondary | ICD-10-CM | POA: Diagnosis not present

## 2022-07-28 LAB — CBC
HCT: 21.9 % — ABNORMAL LOW (ref 36.0–46.0)
Hemoglobin: 7.5 g/dL — ABNORMAL LOW (ref 12.0–15.0)
MCH: 33.8 pg (ref 26.0–34.0)
MCHC: 34.2 g/dL (ref 30.0–36.0)
MCV: 98.6 fL (ref 80.0–100.0)
Platelets: 170 10*3/uL (ref 150–400)
RBC: 2.22 MIL/uL — ABNORMAL LOW (ref 3.87–5.11)
RDW: 12.8 % (ref 11.5–15.5)
WBC: 6.1 10*3/uL (ref 4.0–10.5)
nRBC: 0 % (ref 0.0–0.2)

## 2022-07-28 LAB — BASIC METABOLIC PANEL
Anion gap: 7 (ref 5–15)
BUN: 61 mg/dL — ABNORMAL HIGH (ref 8–23)
CO2: 15 mmol/L — ABNORMAL LOW (ref 22–32)
Calcium: 8.1 mg/dL — ABNORMAL LOW (ref 8.9–10.3)
Chloride: 106 mmol/L (ref 98–111)
Creatinine, Ser: 1.91 mg/dL — ABNORMAL HIGH (ref 0.44–1.00)
GFR, Estimated: 24 mL/min — ABNORMAL LOW (ref >60)
Glucose, Bld: 93 mg/dL (ref 70–99)
Potassium: 5.1 mmol/L (ref 3.5–5.1)
Sodium: 128 mmol/L — ABNORMAL LOW (ref 135–145)

## 2022-07-28 MED ORDER — DARBEPOETIN ALFA 25 MCG/0.42ML IJ SOSY
25.0000 ug | PREFILLED_SYRINGE | Freq: Once | INTRAMUSCULAR | Status: AC
  Administered 2022-07-28: 25 ug via SUBCUTANEOUS
  Filled 2022-07-28: qty 0.42

## 2022-07-28 MED ORDER — VITAMIN B-12 1000 MCG PO TABS
500.0000 ug | ORAL_TABLET | Freq: Every day | ORAL | Status: DC
  Administered 2022-07-28: 500 ug via ORAL
  Filled 2022-07-28: qty 1

## 2022-07-28 MED ORDER — CYANOCOBALAMIN 500 MCG PO TABS: 500.0000 ug | ORAL_TABLET | Freq: Every day | ORAL | Status: DC

## 2022-07-28 NOTE — Discharge Instructions (Signed)
Follow with Primary MD Burnard Bunting, MD in 7 days   Get CBC, CMP,  checked  by Primary MD next visit.    Activity: As tolerated with Full fall precautions use walker/cane & assistance as needed   Disposition Home    Diet: Heart Healthy    On your next visit with your primary care physician please Get Medicines reviewed and adjusted.   Please request your Prim.MD to go over all Hospital Tests and Procedure/Radiological results at the follow up, please get all Hospital records sent to your Prim MD by signing hospital release before you go home.   If you experience worsening of your admission symptoms, develop shortness of breath, life threatening emergency, suicidal or homicidal thoughts you must seek medical attention immediately by calling 911 or calling your MD immediately  if symptoms less severe.  You Must read complete instructions/literature along with all the possible adverse reactions/side effects for all the Medicines you take and that have been prescribed to you. Take any new Medicines after you have completely understood and accpet all the possible adverse reactions/side effects.   Do not drive, operating heavy machinery, perform activities at heights, swimming or participation in water activities or provide baby sitting services if your were admitted for syncope or siezures until you have seen by Primary MD or a Neurologist and advised to do so again.  Do not drive when taking Pain medications.    Do not take more than prescribed Pain, Sleep and Anxiety Medications  Special Instructions: If you have smoked or chewed Tobacco  in the last 2 yrs please stop smoking, stop any regular Alcohol  and or any Recreational drug use.  Wear Seat belts while driving.   Please note  You were cared for by a hospitalist during your hospital stay. If you have any questions about your discharge medications or the care you received while you were in the hospital after you are  discharged, you can call the unit and asked to speak with the hospitalist on call if the hospitalist that took care of you is not available. Once you are discharged, your primary care physician will handle any further medical issues. Please note that NO REFILLS for any discharge medications will be authorized once you are discharged, as it is imperative that you return to your primary care physician (or establish a relationship with a primary care physician if you do not have one) for your aftercare needs so that they can reassess your need for medications and monitor your lab values.

## 2022-07-28 NOTE — Discharge Summary (Signed)
Physician Discharge Summary  Paige Cook KDX:[id number] DOB: January 20, 1928 DOA: 07/25/2022  PCP: Burnard Bunting, MD  Admit date: 07/25/2022 Discharge date: 07/28/2022  Admitted From: Home Disposition:  Home   Recommendations for Outpatient Follow-up:  Follow up with PCP in 1-2 weeks Please obtain BMP/CBC in one week Please consider Procrit or Aranesp as an outpatient in the setting of anemia of chronic kidney disease Consider resuming Lasix as an outpatient given known  low EF of 20%  Home Health:YES   Discharge Condition: Stable CODE STATUS: FULL Diet recommendation: Heart Healthy    Brief/Interim Summary:      Acute hyponatremia - low baseline hyponatremia at 131, significantly low on presentation at 118. -Appears to be volume depletion and dehydration -Improving with IV fluids, sodium is 128 on discharge. -To hold Lasix on discharge   Generalized weakness - Does have a stent likely impacted by electrolyte abnormalities and dehydration as above. -She was seen by PT, home health has been arranged on discharge  Hypertension -Was kept on Coreg, hydralazine has been stopped given soft blood pressure, currently blood pressure on the higher side so we will resume home meds including Coreg and hydralazine . -lasix has been discontinued on discharge  CAD Chronic systolic CHF > Last echo in 2020 with EF 20% and normal RV function.  Volume down on presentation with some improvement after holding home Lasix. - Continue home carvedilol - Holding home Lasix discharge, please consider resuming as an outpatient -Have discussed with the daughter, if she starts to develop any signs of volume overload then to resume back on Lasix   CKD 3B/4 - History shows diagnosis of CKD 3, placed on EGFR here patient's kidney function with baseline creatinine is more consistent with CKD 4. -Currently creatinine stable at around 1.9-2.   Anemia of chronic kidney disease - Hemoglobin stable at 7.7,  last lab we have in our system is from a year ago at 67. -anemia Panel has been obtained , ferritin and iron levels are reassuring, no evidence of iron deficiency, work-up consistent for anemia of chronic kidney disease, will give 1 dose of Aranesp before discharge, consider Aranesp as an outpatient -B12 level is borderline, will start on p.o. supplements   OSA - Continue CPAP  History of bladder cancer - Noted Discharge Diagnoses:  Principal Problem:   Acute hyponatremia Active Problems:   Hypercholesteremia   CAD (coronary artery disease)   OSA (obstructive sleep apnea)   CKD (chronic kidney disease) stage 4, GFR 15-29 ml/min (HCC)   HTN (hypertension)   Chronic systolic CHF (congestive heart failure) (HCC)   Anemia   Malignant neoplasm of bladder, unspecified (HCC)   Generalized weakness    Discharge Instructions  Discharge Instructions     Diet - low sodium heart healthy   Complete by: As directed    Increase activity slowly   Complete by: As directed       Allergies as of 07/28/2022       Reactions   Penicillins Hives   Has patient had a PCN reaction causing immediate rash, facial/tongue/throat swelling, SOB or lightheadedness with hypotension: Yes Has patient had a PCN reaction causing severe rash involving mucus membranes or skin necrosis: Yes Has patient had a PCN reaction that required hospitalization No Has patient had a PCN reaction occurring within the last 10 years: No If all of the above answers are "NO", then may proceed with Cephalosporin use.   Tramadol Nausea Only, Other (See Comments)   shakes  Medication List     STOP taking these medications    furosemide 20 MG tablet Commonly known as: LASIX       TAKE these medications    carvedilol 6.25 MG tablet Commonly known as: COREG TAKE 1 TABLET BY MOUTH TWICE A DAY WITH MEALS   cyanocobalamin 500 MCG tablet Commonly known as: VITAMIN B12 Take 1 tablet (500 mcg total) by mouth  daily. Start taking on: July 29, 2022   hydrALAZINE 10 MG tablet Commonly known as: APRESOLINE TAKE 1 TABLET BY MOUTH THREE TIMES A DAY   saccharomyces boulardii 250 MG capsule Commonly known as: FLORASTOR Take 250 mg by mouth daily.   vitamin C 250 MG tablet Commonly known as: ASCORBIC ACID Take 500 mg by mouth daily with breakfast.   Vitamin D3 50 MCG (2000 UT) Tabs Take 50 mcg by mouth.        Follow-up [place], [facility] Follow up.   Why: Home Health arranged. They will contact you to schedule apt Contact information: [place] [address] [phone]                Allergies  Allergen Reactions   Penicillins Hives    Has patient had a PCN reaction causing immediate rash, facial/tongue/throat swelling, SOB or lightheadedness with hypotension: Yes Has patient had a PCN reaction causing severe rash involving mucus membranes or skin necrosis: Yes Has patient had a PCN reaction that required hospitalization No Has patient had a PCN reaction occurring within the last 10 years: No If all of the above answers are "NO", then may proceed with Cephalosporin use.    Tramadol Nausea Only and Other (See Comments)    shakes    Consultations: none   Procedures/Studies: DG Chest 1 View  Result Date: 07/25/2022 CLINICAL DATA:  Congestive heart failure EXAM: CHEST  1 VIEW COMPARISON:  None Available. FINDINGS: Left suprahilar nodular opacity likely represents vascular shadow, similar to CT examination of 12/22/2009. Lungs are otherwise clear. No pneumothorax or pleural effusion. Mild cardiomegaly is stable. Pulmonary vascularity is normal. No acute bone abnormality. IMPRESSION: Stable cardiomegaly. No radiographic evidence of acute cardiopulmonary disease. Electronically Signed   By: Fidela Salisbury M.D.   On: 07/25/2022 22:02      Subjective:  Reports she had a poor night sleep, still reports some fatigue,  she is eager to go home today Discharge Exam: Vitals:   07/28/22 0335 07/28/22 0808  BP: 138/63 (!) 148/78  Pulse: 89 (!) 122  Resp: 20 20  Temp: 98.4 F (36.9 C) 98.4 F (36.9 C)  SpO2: 94%    Vitals:   07/27/22 1934 07/28/22 0102 07/28/22 0335 07/28/22 0808  BP: (!) 138/54 105/62 138/63 (!) 148/78  Pulse: 80 91 89 (!) 122  Resp: $Remo'18 17 20 20  'JxWYH$ Temp: 98.1 F (36.7 C) 98.7 F (37.1 C) 98.4 F (36.9 C) 98.4 F (36.9 C)  TempSrc: Axillary Oral Oral Oral  SpO2: 97% 95% 94%   Weight:      Height:        General: Pt is alert, awake, not in acute distress Cardiovascular: RRR, S1/S2 +, no rubs, no gallops Respiratory: CTA bilaterally, no wheezing, no rhonchi Abdominal: Soft, NT, ND, bowel sounds + Extremities: no edema, no cyanosis    The results of significant diagnostics from this hospitalization (including imaging, microbiology, ancillary and laboratory) are listed below for reference.     Microbiology: No results  found for this or any previous visit (from the past 240 hour(s)).   Labs: BNP (last 3 results) No results for input(s): "BNP" in the last 8760 hours. Basic Metabolic Panel: Recent Labs  Lab 07/25/22 2009 07/26/22 0200 07/26/22 2235 07/27/22 0233 07/27/22 0634 07/27/22 1137 07/28/22 0749  NA 118*   < > 122* 123* 124* 127* 128*  K 5.6*   < > 5.0 5.3* 5.4* 4.7 5.1  CL 92*   < > 99 99 102 104 106  CO2 16*   < > 16* 14* 13* 14* 15*  GLUCOSE 121*   < > 141* 107* 93 151* 93  BUN 69*   < > 63* 66* 65* 61* 61*  CREATININE 2.00*   < > 2.04* 2.00* 1.87* 1.86* 1.91*  CALCIUM 8.5*   < > 8.0* 8.4* 7.9* 7.8* 8.1*  MG 2.0  --   --   --   --   --   --    < > = values in this interval not displayed.   Liver Function Tests: No results for input(s): "AST", "ALT", "ALKPHOS", "BILITOT", "PROT", "ALBUMIN" in the last 168 hours. No results for input(s): "LIPASE", "AMYLASE" in the last 168 hours. No results for input(s): "AMMONIA" in the last 168 hours. CBC: Recent  Labs  Lab 07/25/22 2009 07/26/22 1851 07/27/22 0634 07/28/22 0749  WBC 5.6 5.8 5.2 6.1  HGB 7.7* 7.7* 7.4* 7.5*  HCT 21.9* 22.7* 20.5* 21.9*  MCV 93.2 97.0 93.6 98.6  PLT 166 153 172 170   Cardiac Enzymes: No results for input(s): "CKTOTAL", "CKMB", "CKMBINDEX", "TROPONINI" in the last 168 hours. BNP: Invalid input(s): "POCBNP" CBG: No results for input(s): "GLUCAP" in the last 168 hours. D-Dimer No results for input(s): "DDIMER" in the last 72 hours. Hgb A1c No results for input(s): "HGBA1C" in the last 72 hours. Lipid Profile No results for input(s): "CHOL", "HDL", "LDLCALC", "TRIG", "CHOLHDL", "LDLDIRECT" in the last 72 hours. Thyroid function studies No results for input(s): "TSH", "T4TOTAL", "T3FREE", "THYROIDAB" in the last 72 hours.  Invalid input(s): "FREET3" Anemia work up Recent Labs    07/27/22 1137  VITAMINB12 472  FOLATE 16.4  FERRITIN 293  TIBC 241*  IRON 76  RETICCTPCT 2.0   Urinalysis    Component Value Date/Time   COLORURINE YELLOW 07/25/2022 2114   APPEARANCEUR CLOUDY (A) 07/25/2022 2114   LABSPEC 1.011 07/25/2022 2114   PHURINE 5.5 07/25/2022 2114   GLUCOSEU NEGATIVE 07/25/2022 2114   HGBUR SMALL (A) 07/25/2022 2114   [place] NEGATIVE 07/25/2022 2114   [place] NEGATIVE 07/25/2022 2114   PROTEINUR 30 (A) 07/25/2022 2114   UROBILINOGEN 0.2 12/06/2009 0207   NITRITE NEGATIVE 07/25/2022 2114   LEUKOCYTESUR LARGE (A) 07/25/2022 2114   Sepsis Labs Recent Labs  Lab 07/25/22 2009 07/26/22 1851 07/27/22 0634 07/28/22 0749  WBC 5.6 5.8 5.2 6.1   Microbiology No results found for this or any previous visit (from the past 240 hour(s)).   Time coordinating discharge: Over 30 minutes  SIGNED:   Phillips Climes, MD  [facility] 07/28/2022, 10:20 AM Pager   If 7PM-7AM, please contact night-coverage [email or web address]

## 2022-07-28 NOTE — TOC Transition Note (Signed)
Transition of Care [facility]) - CM/SW Discharge Note   Patient Details  Name: Paige Cook MRN: [id number] Date of Birth: [birth year]  [facility] CM/SW Contact:  Carles Collet, RN Phone Number: 07/28/2022, 10:41 AM   Clinical Narrative:    Notified [facility] of DC today. No other TOC needs identified for DC       Barriers to Discharge: No Barriers Identified   Patient Goals and CMS Choice Patient states their goals for this hospitalization and ongoing recovery are:: daughter wants patient to return home CMS Medicare.gov Compare Post Acute Care list provided to:: Patient Represenative (must comment) Choice offered to / list presented to : Patient  Discharge Placement                       Discharge Plan and Services   Discharge Planning Services: CM Consult Post Acute Care Choice: Home Health                    HH Arranged: PT [facility] Agency: [place] ([facility]) Date [facility] Agency Contacted: 07/28/22 Time [place]: [phone] Representative spoke with at [place]: Corene Cornea  Social Determinants of Health ([place]) Interventions     Readmission Risk Interventions     No data to display

## 2022-07-29 DIAGNOSIS — I2510 Atherosclerotic heart disease of native coronary artery without angina pectoris: Secondary | ICD-10-CM | POA: Diagnosis not present

## 2022-07-29 DIAGNOSIS — Z8551 Personal history of malignant neoplasm of bladder: Secondary | ICD-10-CM | POA: Diagnosis not present

## 2022-07-29 DIAGNOSIS — I5022 Chronic systolic (congestive) heart failure: Secondary | ICD-10-CM | POA: Diagnosis not present

## 2022-07-29 DIAGNOSIS — I739 Peripheral vascular disease, unspecified: Secondary | ICD-10-CM | POA: Diagnosis not present

## 2022-07-29 DIAGNOSIS — E7800 Pure hypercholesterolemia, unspecified: Secondary | ICD-10-CM | POA: Diagnosis not present

## 2022-07-29 DIAGNOSIS — R0989 Other specified symptoms and signs involving the circulatory and respiratory systems: Secondary | ICD-10-CM | POA: Diagnosis not present

## 2022-07-29 DIAGNOSIS — K5790 Diverticulosis of intestine, part unspecified, without perforation or abscess without bleeding: Secondary | ICD-10-CM | POA: Diagnosis not present

## 2022-07-29 DIAGNOSIS — D631 Anemia in chronic kidney disease: Secondary | ICD-10-CM | POA: Diagnosis not present

## 2022-07-29 DIAGNOSIS — I130 Hypertensive heart and chronic kidney disease with heart failure and stage 1 through stage 4 chronic kidney disease, or unspecified chronic kidney disease: Secondary | ICD-10-CM | POA: Diagnosis not present

## 2022-07-29 DIAGNOSIS — Z9181 History of falling: Secondary | ICD-10-CM | POA: Diagnosis not present

## 2022-07-29 DIAGNOSIS — M1990 Unspecified osteoarthritis, unspecified site: Secondary | ICD-10-CM | POA: Diagnosis not present

## 2022-07-29 DIAGNOSIS — N184 Chronic kidney disease, stage 4 (severe): Secondary | ICD-10-CM | POA: Diagnosis not present

## 2022-07-29 DIAGNOSIS — G4733 Obstructive sleep apnea (adult) (pediatric): Secondary | ICD-10-CM | POA: Diagnosis not present

## 2022-07-29 DIAGNOSIS — I499 Cardiac arrhythmia, unspecified: Secondary | ICD-10-CM | POA: Diagnosis not present

## 2022-07-30 ENCOUNTER — Telehealth: Payer: Self-pay | Admitting: Cardiology

## 2022-07-30 DIAGNOSIS — N179 Acute kidney failure, unspecified: Secondary | ICD-10-CM | POA: Diagnosis not present

## 2022-07-30 DIAGNOSIS — I5021 Acute systolic (congestive) heart failure: Secondary | ICD-10-CM | POA: Diagnosis not present

## 2022-07-30 DIAGNOSIS — N184 Chronic kidney disease, stage 4 (severe): Secondary | ICD-10-CM | POA: Diagnosis not present

## 2022-07-30 DIAGNOSIS — I130 Hypertensive heart and chronic kidney disease with heart failure and stage 1 through stage 4 chronic kidney disease, or unspecified chronic kidney disease: Secondary | ICD-10-CM | POA: Diagnosis not present

## 2022-07-30 DIAGNOSIS — J90 Pleural effusion, not elsewhere classified: Secondary | ICD-10-CM | POA: Diagnosis not present

## 2022-07-30 DIAGNOSIS — E785 Hyperlipidemia, unspecified: Secondary | ICD-10-CM | POA: Diagnosis not present

## 2022-07-30 DIAGNOSIS — I2510 Atherosclerotic heart disease of native coronary artery without angina pectoris: Secondary | ICD-10-CM | POA: Diagnosis not present

## 2022-07-30 DIAGNOSIS — N1832 Chronic kidney disease, stage 3b: Secondary | ICD-10-CM | POA: Diagnosis not present

## 2022-07-30 DIAGNOSIS — E871 Hypo-osmolality and hyponatremia: Secondary | ICD-10-CM | POA: Diagnosis not present

## 2022-07-30 DIAGNOSIS — D638 Anemia in other chronic diseases classified elsewhere: Secondary | ICD-10-CM | POA: Diagnosis not present

## 2022-07-30 DIAGNOSIS — I509 Heart failure, unspecified: Secondary | ICD-10-CM | POA: Diagnosis not present

## 2022-07-30 DIAGNOSIS — E611 Iron deficiency: Secondary | ICD-10-CM | POA: Diagnosis not present

## 2022-07-30 NOTE — Telephone Encounter (Signed)
Called patient's daughter back about patient's weight gain of 8 lbs in one week. Patient is having some SOB and swelling in her ankles. Patient was on lasix in the hospital and they discontinued due to dehydration. Patient's daughter wants to know if patient can go back on her lasix. Patient has appointment with Dr. Martinique tomorrow, but daughter wants patient to see someone today. Informed her that we do not have anything available today, and if she needs to see someone to go to urgent care or call PCP. She just wants to know if patient can take lasix again. Will forward to Dr. Martinique for advisement.

## 2022-07-30 NOTE — Telephone Encounter (Signed)
Called patient's daughter back to let her know, per Dr. Martinique, okay to take lasix today and he will reassess her tomorrow.

## 2022-07-30 NOTE — Telephone Encounter (Signed)
Pt c/o swelling: STAT is pt has developed SOB within 24 hours  How much weight have you gained and in what time span?  About 8 lbs in 1 weeks   If swelling, where is the swelling located?  Ankles  Are you currently taking a fluid pill?  Yes, but hospital took her off Furosemide due to patient being dehydrated  Are you currently SOB?  Not currently with the patient Do you have a log of your daily weights (if so, list)?  Last week patient weighed 102 lbs This week patient weighs 110 lbs   Have you gained 3 pounds in a day or 5 pounds in a week?   Have you traveled recently?  No, patient has been in the hospital. Has not traveled, per daughter

## 2022-07-31 ENCOUNTER — Encounter: Payer: Self-pay | Admitting: Cardiology

## 2022-07-31 ENCOUNTER — Ambulatory Visit (INDEPENDENT_AMBULATORY_CARE_PROVIDER_SITE_OTHER): Payer: Medicare Other | Admitting: Cardiology

## 2022-07-31 VITALS — BP 98/59 | HR 72 | Ht 64.0 in | Wt 111.8 lb

## 2022-07-31 DIAGNOSIS — N1832 Chronic kidney disease, stage 3b: Secondary | ICD-10-CM

## 2022-07-31 DIAGNOSIS — I2510 Atherosclerotic heart disease of native coronary artery without angina pectoris: Secondary | ICD-10-CM | POA: Diagnosis not present

## 2022-07-31 DIAGNOSIS — I10 Essential (primary) hypertension: Secondary | ICD-10-CM | POA: Diagnosis not present

## 2022-07-31 DIAGNOSIS — E785 Hyperlipidemia, unspecified: Secondary | ICD-10-CM | POA: Diagnosis not present

## 2022-07-31 DIAGNOSIS — I5022 Chronic systolic (congestive) heart failure: Secondary | ICD-10-CM | POA: Diagnosis not present

## 2022-07-31 MED ORDER — FUROSEMIDE 20 MG PO TABS: 20.0000 mg | ORAL_TABLET | Freq: Every day | ORAL | 3 refills | Status: DC

## 2022-07-31 NOTE — Patient Instructions (Addendum)
Sodium restriction   Restrict fluid intake to less than 1.2 liters a day (36 oz).  Resume lasix 20 mg daily.  Stop taking hydralazine  Will arrange follow up in 2 weeks.   Weigh yourself daily. Let us know if weight goes up 3 lbs or more.

## 2022-08-02 DIAGNOSIS — E7800 Pure hypercholesterolemia, unspecified: Secondary | ICD-10-CM | POA: Diagnosis not present

## 2022-08-02 DIAGNOSIS — I130 Hypertensive heart and chronic kidney disease with heart failure and stage 1 through stage 4 chronic kidney disease, or unspecified chronic kidney disease: Secondary | ICD-10-CM | POA: Diagnosis not present

## 2022-08-02 DIAGNOSIS — N184 Chronic kidney disease, stage 4 (severe): Secondary | ICD-10-CM | POA: Diagnosis not present

## 2022-08-02 DIAGNOSIS — I5022 Chronic systolic (congestive) heart failure: Secondary | ICD-10-CM | POA: Diagnosis not present

## 2022-08-02 DIAGNOSIS — I2510 Atherosclerotic heart disease of native coronary artery without angina pectoris: Secondary | ICD-10-CM | POA: Diagnosis not present

## 2022-08-02 DIAGNOSIS — D631 Anemia in chronic kidney disease: Secondary | ICD-10-CM | POA: Diagnosis not present

## 2022-08-03 ENCOUNTER — Telehealth: Payer: Self-pay | Admitting: Cardiology

## 2022-08-03 DIAGNOSIS — N179 Acute kidney failure, unspecified: Secondary | ICD-10-CM | POA: Diagnosis not present

## 2022-08-03 DIAGNOSIS — N1832 Chronic kidney disease, stage 3b: Secondary | ICD-10-CM | POA: Diagnosis not present

## 2022-08-03 DIAGNOSIS — I2510 Atherosclerotic heart disease of native coronary artery without angina pectoris: Secondary | ICD-10-CM | POA: Diagnosis not present

## 2022-08-03 DIAGNOSIS — N184 Chronic kidney disease, stage 4 (severe): Secondary | ICD-10-CM | POA: Diagnosis not present

## 2022-08-03 DIAGNOSIS — E611 Iron deficiency: Secondary | ICD-10-CM | POA: Diagnosis not present

## 2022-08-03 DIAGNOSIS — R0601 Orthopnea: Secondary | ICD-10-CM | POA: Diagnosis not present

## 2022-08-03 DIAGNOSIS — I130 Hypertensive heart and chronic kidney disease with heart failure and stage 1 through stage 4 chronic kidney disease, or unspecified chronic kidney disease: Secondary | ICD-10-CM | POA: Diagnosis not present

## 2022-08-03 DIAGNOSIS — I509 Heart failure, unspecified: Secondary | ICD-10-CM | POA: Diagnosis not present

## 2022-08-03 DIAGNOSIS — E871 Hypo-osmolality and hyponatremia: Secondary | ICD-10-CM | POA: Diagnosis not present

## 2022-08-03 DIAGNOSIS — I5021 Acute systolic (congestive) heart failure: Secondary | ICD-10-CM | POA: Diagnosis not present

## 2022-08-03 DIAGNOSIS — D638 Anemia in other chronic diseases classified elsewhere: Secondary | ICD-10-CM | POA: Diagnosis not present

## 2022-08-03 DIAGNOSIS — J90 Pleural effusion, not elsewhere classified: Secondary | ICD-10-CM | POA: Diagnosis not present

## 2022-08-03 NOTE — Telephone Encounter (Signed)
Pt c/o medication issue:  1. Name of Medication:   furosemide (LASIX) 40 MG tablet (for 3 days)  2. How are you currently taking this medication (dosage and times per day)? As prescribed  3. Are you having a reaction (difficulty breathing--STAT)?  No  4. What is your medication issue?   Daughter called stating the patient has been given new medication and she would like to discuss this change.  Daughter stated patient has had slightly more fluid on her left lung.

## 2022-08-03 NOTE — Telephone Encounter (Signed)
Daughter Anderson Malta stated that PCP found fluid left lung CXR today and wanted patient to take lasix '40mg'$  for three days. Daughter wanted Dr. Martinique to know and get his opinion.   From Care Everywhere  CXR with COPD with findings consistent with CHF with slight increase in small left pleural effusion.   Down 8lbs since Tuesday   Increase lasix to '40mg'$  for 3 days   continue daily weights   1wk f/u with NP w/ cbc, bmet, cxr, wt    08/03/22 Cr 2.1    Recommended to daughter that if she does not hear back from [place], she should follow PCP instructions regarding lasix '40mg'$  for 3 days.

## 2022-08-03 NOTE — Telephone Encounter (Signed)
Daughter Anderson Malta informed of reply from DR. Martinique.Marland KitchenMarland Kitchen"Yes OK to take lasix 40 mg for 3 days then resume 20 mg daily." She was very relieved and thankful for Dr. Doug Sou response.

## 2022-08-07 DIAGNOSIS — D631 Anemia in chronic kidney disease: Secondary | ICD-10-CM | POA: Diagnosis not present

## 2022-08-07 DIAGNOSIS — I5022 Chronic systolic (congestive) heart failure: Secondary | ICD-10-CM | POA: Diagnosis not present

## 2022-08-07 DIAGNOSIS — N184 Chronic kidney disease, stage 4 (severe): Secondary | ICD-10-CM | POA: Diagnosis not present

## 2022-08-07 DIAGNOSIS — I2510 Atherosclerotic heart disease of native coronary artery without angina pectoris: Secondary | ICD-10-CM | POA: Diagnosis not present

## 2022-08-07 DIAGNOSIS — E7800 Pure hypercholesterolemia, unspecified: Secondary | ICD-10-CM | POA: Diagnosis not present

## 2022-08-07 DIAGNOSIS — I130 Hypertensive heart and chronic kidney disease with heart failure and stage 1 through stage 4 chronic kidney disease, or unspecified chronic kidney disease: Secondary | ICD-10-CM | POA: Diagnosis not present

## 2022-08-08 DIAGNOSIS — I2510 Atherosclerotic heart disease of native coronary artery without angina pectoris: Secondary | ICD-10-CM | POA: Diagnosis not present

## 2022-08-08 DIAGNOSIS — N184 Chronic kidney disease, stage 4 (severe): Secondary | ICD-10-CM | POA: Diagnosis not present

## 2022-08-08 DIAGNOSIS — I5021 Acute systolic (congestive) heart failure: Secondary | ICD-10-CM | POA: Diagnosis not present

## 2022-08-08 DIAGNOSIS — J90 Pleural effusion, not elsewhere classified: Secondary | ICD-10-CM | POA: Diagnosis not present

## 2022-08-08 DIAGNOSIS — I509 Heart failure, unspecified: Secondary | ICD-10-CM | POA: Diagnosis not present

## 2022-08-08 DIAGNOSIS — D638 Anemia in other chronic diseases classified elsewhere: Secondary | ICD-10-CM | POA: Diagnosis not present

## 2022-08-08 DIAGNOSIS — I130 Hypertensive heart and chronic kidney disease with heart failure and stage 1 through stage 4 chronic kidney disease, or unspecified chronic kidney disease: Secondary | ICD-10-CM | POA: Diagnosis not present

## 2022-08-08 DIAGNOSIS — E611 Iron deficiency: Secondary | ICD-10-CM | POA: Diagnosis not present

## 2022-08-08 DIAGNOSIS — R531 Weakness: Secondary | ICD-10-CM | POA: Diagnosis not present

## 2022-08-08 DIAGNOSIS — E871 Hypo-osmolality and hyponatremia: Secondary | ICD-10-CM | POA: Diagnosis not present

## 2022-08-08 DIAGNOSIS — N179 Acute kidney failure, unspecified: Secondary | ICD-10-CM | POA: Diagnosis not present

## 2022-08-08 DIAGNOSIS — R0601 Orthopnea: Secondary | ICD-10-CM | POA: Diagnosis not present

## 2022-08-14 ENCOUNTER — Encounter: Payer: Self-pay | Admitting: Nurse Practitioner

## 2022-08-14 ENCOUNTER — Ambulatory Visit: Payer: Medicare Other | Attending: Nurse Practitioner | Admitting: Nurse Practitioner

## 2022-08-14 VITALS — BP 118/62 | HR 80 | Ht 64.0 in | Wt 106.0 lb

## 2022-08-14 DIAGNOSIS — D649 Anemia, unspecified: Secondary | ICD-10-CM | POA: Insufficient documentation

## 2022-08-14 DIAGNOSIS — I2510 Atherosclerotic heart disease of native coronary artery without angina pectoris: Secondary | ICD-10-CM | POA: Diagnosis not present

## 2022-08-14 DIAGNOSIS — G4733 Obstructive sleep apnea (adult) (pediatric): Secondary | ICD-10-CM | POA: Insufficient documentation

## 2022-08-14 DIAGNOSIS — E785 Hyperlipidemia, unspecified: Secondary | ICD-10-CM | POA: Diagnosis not present

## 2022-08-14 DIAGNOSIS — Z79899 Other long term (current) drug therapy: Secondary | ICD-10-CM | POA: Diagnosis not present

## 2022-08-14 DIAGNOSIS — N1832 Chronic kidney disease, stage 3b: Secondary | ICD-10-CM | POA: Diagnosis not present

## 2022-08-14 DIAGNOSIS — I5022 Chronic systolic (congestive) heart failure: Secondary | ICD-10-CM | POA: Diagnosis not present

## 2022-08-14 DIAGNOSIS — E871 Hypo-osmolality and hyponatremia: Secondary | ICD-10-CM | POA: Diagnosis not present

## 2022-08-14 DIAGNOSIS — I428 Other cardiomyopathies: Secondary | ICD-10-CM | POA: Insufficient documentation

## 2022-08-14 NOTE — Patient Instructions (Signed)
Medication Instructions:  Your physician recommends that you continue on your current medications as directed. Please refer to the Current Medication list given to you today.   *If you need a refill on your cardiac medications before your next appointment, please call your pharmacy*   Lab Work: Your physician recommends that you complete labs today. BMET CBC  If you have labs (blood work) drawn today and your tests are completely normal, you will receive your results only by: [place] (if you have MyChart) OR A paper copy in the mail If you have any lab test that is abnormal or we need to change your treatment, we will call you to review the results.   Testing/Procedures: NONE ordered at this time of appointment     Follow-Up: At [facility], you and your health needs are our priority.  As part of our continuing mission to provide you with exceptional heart care, we have created designated Provider Care Teams.  These Care Teams include your primary Cardiologist (physician) and Advanced Practice Providers (APPs -  Physician Assistants and Nurse Practitioners) who all work together to provide you with the care you need, when you need it.  We recommend signing up for the patient portal called "MyChart".  Sign up information is provided on this After Visit Summary.  MyChart is used to connect with patients for Virtual Visits (Telemedicine).  Patients are able to view lab/test results, encounter notes, upcoming appointments, etc.  Non-urgent messages can be sent to your provider as well.   To learn more about what you can do with MyChart, go to [email or web address].    Your next appointment:   4-6 week(s)  The format for your next appointment:   In Person  Provider:   Diona Browner, NP        Other Instructions   Important Information About Sugar

## 2022-08-14 NOTE — Progress Notes (Signed)
Office Visit    Patient Name: Paige Cook Date of Encounter: 08/14/2022  Primary Care Provider:  Burnard Bunting, MD Primary Cardiologist:  Peter Martinique, MD  Chief Complaint    88 year old female with a history of chronic systolic heart failure/NICM, nonobstructive CAD, PACs, hyperlipidemia, CKD stage IIIb, and OSA who presents for follow-up related to heart failure.  Past Medical History    Past Medical History:  Diagnosis Date   Acute on chronic systolic (congestive) heart failure ([place]) 08/14/2019   AKI (acute kidney injury) ([place]) 05/22/2021   Allergic rhinitis    Anemia    Arthritis    Bladder cancer ([place])    giant tumor   CAD (coronary artery disease)    Cardiac arrhythmia    benign   Carotid bruit    left   CHF (congestive heart failure) ([place])    CHF with systolic dysfuntion ; dilated nonishemic cardiomyopathy   Colon cancer ([place]) 1989   Colon polyps    Diverticulosis    Epistaxis    Female bladder prolapse    Gallstones    Heart failure (HCC)    Hypercholesteremia    Hyponatremia    Probably secondary to her CHF   OSA (obstructive sleep apnea) 04/29/2012   SOB (shortness of breath)    Sometimes at night   Past Surgical History:  Procedure Laterality Date   ABDOMINAL HYSTERECTOMY     BLADDER SURGERY  2013   cancer removed   bladder tact     CARDIAC CATHETERIZATION     non obstruction CAD   CHOLECYSTECTOMY     COLECTOMY     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    Tramadol Nausea Only and Other (See Comments)    shakes    History of Present Illness    88 year old female with the above past medical history including chronic systolic heart failure/NICM, nonobstructive CAD, PACs, hyperlipidemia, CKD stage IIIb, and OSA.  Prior cardiac catheterization in 12/2009 showed EF 30%, 40- 50% pLAD lesion, 30% mid LAD lesion, otherwise normal left circumflex, left main, and RCA.  EF in 2012 was 40%.  Subsequent echocardiogram in 2013 showed EF of 45%.  Prior Holter monitor revealed frequent PVCs, no evidence of atrial fibrillation. Echocardiogram in 07/2019 revealed EF 20%, severely dilated LV cavity, elevated LVEDP, left ventricular global hypokinesis without regional wall motion abnormalities, normal RV systolic function, moderately dilated left atrium, mildly dilated right atrium, moderate thickening of mitral valve leaflet, mild MAC, moderate mitral valve rotation, aortic valve sclerosis without any evidence of aortic stenosis.  Titration of heart failure medications has been limited due to CKD and hypotension.  She was hospitalized in August 2023 with generalized weakness, hyponatremia.  She was hydrated with normal saline, Lasix was held.  She was seen in the office on 07/31/2022 and noted increased shortness of breath, mild ankle edema.  Chest x-ray post hospital discharge showed increased pulmonary edema, some effusions.  Sodium and fluid restriction was recommended (  1.2 L / 36 ounces daily).  Lasix was resumed at 20 mg daily.    She presents today for follow-up accompanied by her daughter.  Since her last visit been stable from a cardiac standpoint.  She does note some ongoing non-pitting bilateral extremity edema.  She denies any dyspnea, PND, orthopnea, weight gain.  She denies symptoms concerning for angina.  She is scheduled for a blood transfusion next week for ongoing anemia.  He is asking if she can begin to increase her fluid intake.   Otherwise, she reports feeling well and denies any additional concerns today.  Home Medications    Current Outpatient Medications  Medication Sig Dispense Refill   carvedilol (COREG) 6.25 MG tablet TAKE 1 TABLET BY MOUTH TWICE A DAY WITH MEALS 180 tablet 2   Cholecalciferol (VITAMIN D3) 50 MCG (2000 UT) TABS Take 50 mcg by mouth.     cyanocobalamin (VITAMIN B12) 500 MCG tablet Take 1 tablet (500 mcg total) by mouth daily.     furosemide (LASIX) 20 MG tablet Take 1 tablet (20 mg total) by mouth daily. 90 tablet 3   furosemide (LASIX) 40 MG tablet Take 20 mg by mouth daily in the afternoon.     saccharomyces boulardii (FLORASTOR) 250 MG capsule Take 250 mg by mouth daily.     vitamin C (ASCORBIC ACID) 250 MG tablet Take 500 mg by mouth daily with breakfast.      No current facility-administered medications for this visit.     Review of Systems    She denies chest pain, palpitations, dyspnea, pnd, orthopnea, n, v, dizziness, syncope, edema, weight gain, or early satiety. All other systems reviewed and are otherwise negative except as noted above.   Physical Exam    VS:  BP 118/62   Pulse 80   Ht _0  (1.626 m)   Wt 106 lb (48.1 kg)   SpO2 98%   BMI 18.19 kg/m   GEN: Well nourished, well developed, in no acute distress. HEENT: normal. Neck: Supple, no JVD, carotid bruits, or masses. Cardiac: RRR, no murmurs, rubs, or gallops. No clubbing, cyanosis, edema.  Radials/DP/PT 2+ and equal bilaterally.  Respiratory:  Respirations regular and unlabored, clear to auscultation bilaterally. GI: Soft, nontender, nondistended, BS + x 4. MS: no deformity or atrophy. Skin: warm and dry, no rash. Neuro:  Strength and sensation are intact. Psych: Normal affect.  Accessory Clinical Findings    ECG personally reviewed by me today - No EKG in office today.     Lab Results  Component Value Date   WBC 6.1 07/28/2022   HGB 7.5 (L) 07/28/2022   HCT 21.9 (L) 07/28/2022   MCV 98.6 07/28/2022    PLT 170 07/28/2022   Lab Results  Component Value Date   CREATININE 1.91 (H) 07/28/2022   BUN 61 (H) 07/28/2022   NA 128 (L) 07/28/2022   K 5.1 07/28/2022   CL 106 07/28/2022   CO2 15 (L) 07/28/2022   Lab Results  Component Value Date   ALT 9 05/22/2021   AST 17 05/22/2021   ALKPHOS 62 05/22/2021   BILITOT 0.3 05/22/2021   Lab Results  Component Value Date   CHOL 164 01/28/2020   HDL 60 01/28/2020   LDLCALC 91 01/28/2020   LDLDIRECT 152.9 03/06/2013   TRIG 64 01/28/2020   CHOLHDL 2.7 01/28/2020    No results found for: "HGBA1C"  Assessment & Plan    1. Chronic systolic heart failure/NICM: Echo in 07/2019 revealed EF  20%, severely dilated LV cavity, elevated LVEDP, left ventricular global hypokinesis without regional wall motion abnormalities, normal RV systolic function, moderately dilated left atrium, mildly dilated right atrium, moderate thickening of mitral valve leaflet, mild MAC, moderate mitral valve rotation, aortic valve sclerosis without any evidence of aortic stenosis. Weight is down 5 pounds in 2 weeks. She does have some ongoing bilateral lower extremity edema, otherwise euvolemic and well compensated on exam.  Will check BMET today.  If renal function, electrolytes stable, consider increasing Lasix to 20 mg twice daily for 3 days, followed by 20 mg daily.  Pending renal function, consider careful increase in po fluid intake.  Continue Lasix, carvedilol.  2. Hyponatremia: Sodium was 128 on 07/28/2022.  Will repeat BMET today.  For now, continue 1.2 L fluid restriction.  3. CAD: Cath in 12/2009 showed EF 30%, 40 to 50% pLAD lesion, 30% mid LAD lesion, otherwise normal left circumflex, left main, and RCA.   Stable with no anginal symptoms. No indication for ischemic evaluation.  Continue carvedilol.   4. Hyperlipidemia: LDL was 91 in 06/2022.  Monitored and managed per PCP.  Not on statin.  5. CKD stage IIIb: Creatinine 1.91 on 07/28/2022.  Repeat BMET pending as  above.  6. Anemia: She is scheduled for blood transfusion next week. May require additional diuresis following transfusion.  Monitor closely. She denies bleeding. Will repeat CBC today in preparation for upcoming transfusion.   7. OSA: Non-adherent to CPAP.   8. Disposition: Follow-up in 4-6 weeks.       Lenna Sciara, NP 08/14/2022, 5:36 PM

## 2022-08-15 ENCOUNTER — Other Ambulatory Visit: Payer: Self-pay

## 2022-08-15 ENCOUNTER — Telehealth: Payer: Self-pay

## 2022-08-15 DIAGNOSIS — D649 Anemia, unspecified: Secondary | ICD-10-CM

## 2022-08-15 DIAGNOSIS — Z79899 Other long term (current) drug therapy: Secondary | ICD-10-CM

## 2022-08-15 DIAGNOSIS — M5116 Intervertebral disc disorders with radiculopathy, lumbar region: Secondary | ICD-10-CM | POA: Diagnosis not present

## 2022-08-15 LAB — BASIC METABOLIC PANEL
BUN/Creatinine Ratio: 41 — ABNORMAL HIGH (ref 12–28)
BUN: 75 mg/dL — ABNORMAL HIGH (ref 10–36)
CO2: 19 mmol/L — ABNORMAL LOW (ref 20–29)
Calcium: 9.3 mg/dL (ref 8.7–10.3)
Chloride: 97 mmol/L (ref 96–106)
Creatinine, Ser: 1.81 mg/dL — ABNORMAL HIGH (ref 0.57–1.00)
Glucose: 131 mg/dL — ABNORMAL HIGH (ref 70–99)
Potassium: 5 mmol/L (ref 3.5–5.2)
Sodium: 131 mmol/L — ABNORMAL LOW (ref 134–144)
eGFR: 26 mL/min/{1.73_m2} — ABNORMAL LOW (ref >59)

## 2022-08-15 LAB — CBC
Hematocrit: 25.4 % — ABNORMAL LOW (ref 34.0–46.6)
Hemoglobin: 8.4 g/dL — ABNORMAL LOW (ref 11.1–15.9)
MCH: 32.8 pg (ref 26.6–33.0)
MCHC: 33.1 g/dL (ref 31.5–35.7)
MCV: 99 fL — ABNORMAL HIGH (ref 79–97)
Platelets: 217 10*3/uL (ref 150–450)
RBC: 2.56 x10E6/uL — CL (ref 3.77–5.28)
RDW: 13.5 % (ref 11.7–15.4)
WBC: 4.8 10*3/uL (ref 3.4–10.8)

## 2022-08-15 NOTE — Telephone Encounter (Signed)
Spoke with pt and pts daughter. They were notified of lab results and recommendations. Pt will take lasix 20 mg bid x 3 days and follow up as planned. Pt will complete infusion and repeat labs in 1 week. Lab orders placed.

## 2022-08-15 NOTE — Telephone Encounter (Signed)
Lmom (pt & pts daughter) to discuss lab results and recommendations. Waiting on a return call.

## 2022-08-16 DIAGNOSIS — I5022 Chronic systolic (congestive) heart failure: Secondary | ICD-10-CM | POA: Diagnosis not present

## 2022-08-16 DIAGNOSIS — I2510 Atherosclerotic heart disease of native coronary artery without angina pectoris: Secondary | ICD-10-CM | POA: Diagnosis not present

## 2022-08-16 DIAGNOSIS — I130 Hypertensive heart and chronic kidney disease with heart failure and stage 1 through stage 4 chronic kidney disease, or unspecified chronic kidney disease: Secondary | ICD-10-CM | POA: Diagnosis not present

## 2022-08-16 DIAGNOSIS — N184 Chronic kidney disease, stage 4 (severe): Secondary | ICD-10-CM | POA: Diagnosis not present

## 2022-08-16 DIAGNOSIS — E7800 Pure hypercholesterolemia, unspecified: Secondary | ICD-10-CM | POA: Diagnosis not present

## 2022-08-16 DIAGNOSIS — D631 Anemia in chronic kidney disease: Secondary | ICD-10-CM | POA: Diagnosis not present

## 2022-08-17 DIAGNOSIS — I130 Hypertensive heart and chronic kidney disease with heart failure and stage 1 through stage 4 chronic kidney disease, or unspecified chronic kidney disease: Secondary | ICD-10-CM | POA: Diagnosis not present

## 2022-08-17 DIAGNOSIS — N184 Chronic kidney disease, stage 4 (severe): Secondary | ICD-10-CM | POA: Diagnosis not present

## 2022-08-17 DIAGNOSIS — D631 Anemia in chronic kidney disease: Secondary | ICD-10-CM | POA: Diagnosis not present

## 2022-08-17 DIAGNOSIS — E7800 Pure hypercholesterolemia, unspecified: Secondary | ICD-10-CM | POA: Diagnosis not present

## 2022-08-17 DIAGNOSIS — I2510 Atherosclerotic heart disease of native coronary artery without angina pectoris: Secondary | ICD-10-CM | POA: Diagnosis not present

## 2022-08-17 DIAGNOSIS — I5022 Chronic systolic (congestive) heart failure: Secondary | ICD-10-CM | POA: Diagnosis not present

## 2022-08-21 ENCOUNTER — Other Ambulatory Visit (HOSPITAL_COMMUNITY): Payer: Self-pay | Admitting: *Deleted

## 2022-08-21 DIAGNOSIS — D649 Anemia, unspecified: Secondary | ICD-10-CM

## 2022-08-22 ENCOUNTER — Ambulatory Visit (HOSPITAL_COMMUNITY)
Admission: RE | Admit: 2022-08-22 | Discharge: 2022-08-22 | Disposition: A | Discharge status: Home / Self Care | Payer: Medicare Other | Source: Ambulatory Visit | Attending: Internal Medicine | Admitting: Internal Medicine

## 2022-08-22 DIAGNOSIS — D649 Anemia, unspecified: Secondary | ICD-10-CM | POA: Diagnosis not present

## 2022-08-22 LAB — PREPARE RBC (CROSSMATCH)

## 2022-08-22 MED ORDER — FUROSEMIDE 10 MG/ML IJ SOLN: 20.0000 mg | Freq: Once | INTRAMUSCULAR | Status: AC

## 2022-08-22 MED ORDER — FUROSEMIDE 10 MG/ML IJ SOLN
INTRAMUSCULAR | Status: AC
  Administered 2022-08-22: 20 mg via INTRAVENOUS
  Filled 2022-08-22: qty 2

## 2022-08-22 MED ORDER — SODIUM CHLORIDE 0.9% IV SOLUTION: Freq: Once | INTRAVENOUS | Status: DC

## 2022-08-23 DIAGNOSIS — E7800 Pure hypercholesterolemia, unspecified: Secondary | ICD-10-CM | POA: Diagnosis not present

## 2022-08-23 DIAGNOSIS — I5022 Chronic systolic (congestive) heart failure: Secondary | ICD-10-CM | POA: Diagnosis not present

## 2022-08-23 DIAGNOSIS — D631 Anemia in chronic kidney disease: Secondary | ICD-10-CM | POA: Diagnosis not present

## 2022-08-23 DIAGNOSIS — I130 Hypertensive heart and chronic kidney disease with heart failure and stage 1 through stage 4 chronic kidney disease, or unspecified chronic kidney disease: Secondary | ICD-10-CM | POA: Diagnosis not present

## 2022-08-23 DIAGNOSIS — D649 Anemia, unspecified: Secondary | ICD-10-CM | POA: Diagnosis not present

## 2022-08-23 DIAGNOSIS — Z79899 Other long term (current) drug therapy: Secondary | ICD-10-CM | POA: Diagnosis not present

## 2022-08-23 DIAGNOSIS — I2510 Atherosclerotic heart disease of native coronary artery without angina pectoris: Secondary | ICD-10-CM | POA: Diagnosis not present

## 2022-08-23 DIAGNOSIS — N184 Chronic kidney disease, stage 4 (severe): Secondary | ICD-10-CM | POA: Diagnosis not present

## 2022-08-23 LAB — TYPE AND SCREEN
ABO/RH(D): O NEG
Antibody Screen: NEGATIVE
Unit division: 0

## 2022-08-23 LAB — BPAM RBC
Blood Product Expiration Date: 202309112359
ISSUE DATE / TIME: 202309061031
Unit Type and Rh: 9500

## 2022-08-24 LAB — BASIC METABOLIC PANEL
BUN/Creatinine Ratio: 38 — ABNORMAL HIGH (ref 12–28)
BUN: 81 mg/dL (ref 10–36)
CO2: 17 mmol/L — ABNORMAL LOW (ref 20–29)
Calcium: 8.9 mg/dL (ref 8.7–10.3)
Chloride: 93 mmol/L — ABNORMAL LOW (ref 96–106)
Creatinine, Ser: 2.13 mg/dL — ABNORMAL HIGH (ref 0.57–1.00)
Glucose: 103 mg/dL — ABNORMAL HIGH (ref 70–99)
Potassium: 4.9 mmol/L (ref 3.5–5.2)
Sodium: 130 mmol/L — ABNORMAL LOW (ref 134–144)
eGFR: 21 mL/min/{1.73_m2} — ABNORMAL LOW (ref >59)

## 2022-08-27 ENCOUNTER — Telehealth: Payer: Self-pay

## 2022-08-27 ENCOUNTER — Other Ambulatory Visit: Payer: Self-pay

## 2022-08-27 DIAGNOSIS — Z79899 Other long term (current) drug therapy: Secondary | ICD-10-CM

## 2022-08-27 DIAGNOSIS — D649 Anemia, unspecified: Secondary | ICD-10-CM

## 2022-08-27 DIAGNOSIS — I130 Hypertensive heart and chronic kidney disease with heart failure and stage 1 through stage 4 chronic kidney disease, or unspecified chronic kidney disease: Secondary | ICD-10-CM | POA: Diagnosis not present

## 2022-08-27 DIAGNOSIS — E7800 Pure hypercholesterolemia, unspecified: Secondary | ICD-10-CM | POA: Diagnosis not present

## 2022-08-27 DIAGNOSIS — I5022 Chronic systolic (congestive) heart failure: Secondary | ICD-10-CM | POA: Diagnosis not present

## 2022-08-27 DIAGNOSIS — N184 Chronic kidney disease, stage 4 (severe): Secondary | ICD-10-CM | POA: Diagnosis not present

## 2022-08-27 DIAGNOSIS — D631 Anemia in chronic kidney disease: Secondary | ICD-10-CM | POA: Diagnosis not present

## 2022-08-27 DIAGNOSIS — I2510 Atherosclerotic heart disease of native coronary artery without angina pectoris: Secondary | ICD-10-CM | POA: Diagnosis not present

## 2022-08-27 NOTE — Telephone Encounter (Signed)
Daughter called stating she was advised that the patient's furosemide (LASIX) 20 MG tablet was to be stopped.  Daughter stated she needs clarification on whether the increased dosage was to be stopped on this medication stopped completely.

## 2022-08-27 NOTE — Telephone Encounter (Signed)
Patient's daughter is returning call.

## 2022-08-27 NOTE — Telephone Encounter (Signed)
Spoke with pts daughter. She was notified of lab results and recommendations. Lab orders placed for 1 week (BMET).

## 2022-08-27 NOTE — Telephone Encounter (Signed)
Called pt's daughter to go over instructions. She is made aware her mother is supposed to continue on Lasix  20 mg daily and come to get labs drawn next week. She verbalized understanding, she states they will come next week to get the labs drawn.

## 2022-08-27 NOTE — Telephone Encounter (Signed)
Left message for patient to return the call.

## 2022-08-28 DIAGNOSIS — I2510 Atherosclerotic heart disease of native coronary artery without angina pectoris: Secondary | ICD-10-CM | POA: Diagnosis not present

## 2022-08-28 DIAGNOSIS — G4733 Obstructive sleep apnea (adult) (pediatric): Secondary | ICD-10-CM | POA: Diagnosis not present

## 2022-08-28 DIAGNOSIS — N184 Chronic kidney disease, stage 4 (severe): Secondary | ICD-10-CM | POA: Diagnosis not present

## 2022-08-28 DIAGNOSIS — E7800 Pure hypercholesterolemia, unspecified: Secondary | ICD-10-CM | POA: Diagnosis not present

## 2022-08-28 DIAGNOSIS — R0989 Other specified symptoms and signs involving the circulatory and respiratory systems: Secondary | ICD-10-CM | POA: Diagnosis not present

## 2022-08-28 DIAGNOSIS — I130 Hypertensive heart and chronic kidney disease with heart failure and stage 1 through stage 4 chronic kidney disease, or unspecified chronic kidney disease: Secondary | ICD-10-CM | POA: Diagnosis not present

## 2022-08-28 DIAGNOSIS — I739 Peripheral vascular disease, unspecified: Secondary | ICD-10-CM | POA: Diagnosis not present

## 2022-08-28 DIAGNOSIS — I499 Cardiac arrhythmia, unspecified: Secondary | ICD-10-CM | POA: Diagnosis not present

## 2022-08-28 DIAGNOSIS — Z9181 History of falling: Secondary | ICD-10-CM | POA: Diagnosis not present

## 2022-08-28 DIAGNOSIS — Z8551 Personal history of malignant neoplasm of bladder: Secondary | ICD-10-CM | POA: Diagnosis not present

## 2022-08-28 DIAGNOSIS — K5790 Diverticulosis of intestine, part unspecified, without perforation or abscess without bleeding: Secondary | ICD-10-CM | POA: Diagnosis not present

## 2022-08-28 DIAGNOSIS — D631 Anemia in chronic kidney disease: Secondary | ICD-10-CM | POA: Diagnosis not present

## 2022-08-28 DIAGNOSIS — M1990 Unspecified osteoarthritis, unspecified site: Secondary | ICD-10-CM | POA: Diagnosis not present

## 2022-08-28 DIAGNOSIS — I5022 Chronic systolic (congestive) heart failure: Secondary | ICD-10-CM | POA: Diagnosis not present

## 2022-08-29 DIAGNOSIS — M19011 Primary osteoarthritis, right shoulder: Secondary | ICD-10-CM | POA: Diagnosis not present

## 2022-08-29 DIAGNOSIS — M25511 Pain in right shoulder: Secondary | ICD-10-CM | POA: Diagnosis not present

## 2022-08-31 DIAGNOSIS — E611 Iron deficiency: Secondary | ICD-10-CM | POA: Diagnosis not present

## 2022-08-31 DIAGNOSIS — I130 Hypertensive heart and chronic kidney disease with heart failure and stage 1 through stage 4 chronic kidney disease, or unspecified chronic kidney disease: Secondary | ICD-10-CM | POA: Diagnosis not present

## 2022-08-31 DIAGNOSIS — I5021 Acute systolic (congestive) heart failure: Secondary | ICD-10-CM | POA: Diagnosis not present

## 2022-08-31 DIAGNOSIS — D638 Anemia in other chronic diseases classified elsewhere: Secondary | ICD-10-CM | POA: Diagnosis not present

## 2022-08-31 DIAGNOSIS — J90 Pleural effusion, not elsewhere classified: Secondary | ICD-10-CM | POA: Diagnosis not present

## 2022-08-31 DIAGNOSIS — R531 Weakness: Secondary | ICD-10-CM | POA: Diagnosis not present

## 2022-08-31 DIAGNOSIS — N184 Chronic kidney disease, stage 4 (severe): Secondary | ICD-10-CM | POA: Diagnosis not present

## 2022-08-31 DIAGNOSIS — N179 Acute kidney failure, unspecified: Secondary | ICD-10-CM | POA: Diagnosis not present

## 2022-08-31 DIAGNOSIS — E871 Hypo-osmolality and hyponatremia: Secondary | ICD-10-CM | POA: Diagnosis not present

## 2022-08-31 DIAGNOSIS — I509 Heart failure, unspecified: Secondary | ICD-10-CM | POA: Diagnosis not present

## 2022-09-06 DIAGNOSIS — I5022 Chronic systolic (congestive) heart failure: Secondary | ICD-10-CM | POA: Diagnosis not present

## 2022-09-06 DIAGNOSIS — Z79899 Other long term (current) drug therapy: Secondary | ICD-10-CM | POA: Diagnosis not present

## 2022-09-06 DIAGNOSIS — D649 Anemia, unspecified: Secondary | ICD-10-CM | POA: Diagnosis not present

## 2022-09-07 DIAGNOSIS — D631 Anemia in chronic kidney disease: Secondary | ICD-10-CM | POA: Diagnosis not present

## 2022-09-07 DIAGNOSIS — E7800 Pure hypercholesterolemia, unspecified: Secondary | ICD-10-CM | POA: Diagnosis not present

## 2022-09-07 DIAGNOSIS — I5022 Chronic systolic (congestive) heart failure: Secondary | ICD-10-CM | POA: Diagnosis not present

## 2022-09-07 DIAGNOSIS — N184 Chronic kidney disease, stage 4 (severe): Secondary | ICD-10-CM | POA: Diagnosis not present

## 2022-09-07 DIAGNOSIS — I2510 Atherosclerotic heart disease of native coronary artery without angina pectoris: Secondary | ICD-10-CM | POA: Diagnosis not present

## 2022-09-07 DIAGNOSIS — I130 Hypertensive heart and chronic kidney disease with heart failure and stage 1 through stage 4 chronic kidney disease, or unspecified chronic kidney disease: Secondary | ICD-10-CM | POA: Diagnosis not present

## 2022-09-07 LAB — BASIC METABOLIC PANEL
BUN/Creatinine Ratio: 49 — ABNORMAL HIGH (ref 12–28)
BUN: 94 mg/dL (ref 10–36)
CO2: 16 mmol/L — ABNORMAL LOW (ref 20–29)
Calcium: 9.1 mg/dL (ref 8.7–10.3)
Chloride: 94 mmol/L — ABNORMAL LOW (ref 96–106)
Creatinine, Ser: 1.91 mg/dL — ABNORMAL HIGH (ref 0.57–1.00)
Glucose: 156 mg/dL — ABNORMAL HIGH (ref 70–99)
Potassium: 4.9 mmol/L (ref 3.5–5.2)
Sodium: 129 mmol/L — ABNORMAL LOW (ref 134–144)
eGFR: 24 mL/min/{1.73_m2} — ABNORMAL LOW (ref >59)

## 2022-09-12 ENCOUNTER — Telehealth: Payer: Self-pay | Admitting: Nurse Practitioner

## 2022-09-12 ENCOUNTER — Telehealth: Payer: Self-pay

## 2022-09-12 DIAGNOSIS — E7800 Pure hypercholesterolemia, unspecified: Secondary | ICD-10-CM | POA: Diagnosis not present

## 2022-09-12 DIAGNOSIS — I130 Hypertensive heart and chronic kidney disease with heart failure and stage 1 through stage 4 chronic kidney disease, or unspecified chronic kidney disease: Secondary | ICD-10-CM | POA: Diagnosis not present

## 2022-09-12 DIAGNOSIS — E871 Hypo-osmolality and hyponatremia: Secondary | ICD-10-CM

## 2022-09-12 DIAGNOSIS — I5022 Chronic systolic (congestive) heart failure: Secondary | ICD-10-CM | POA: Diagnosis not present

## 2022-09-12 DIAGNOSIS — N184 Chronic kidney disease, stage 4 (severe): Secondary | ICD-10-CM | POA: Diagnosis not present

## 2022-09-12 DIAGNOSIS — D631 Anemia in chronic kidney disease: Secondary | ICD-10-CM | POA: Diagnosis not present

## 2022-09-12 DIAGNOSIS — I2510 Atherosclerotic heart disease of native coronary artery without angina pectoris: Secondary | ICD-10-CM | POA: Diagnosis not present

## 2022-09-12 NOTE — Telephone Encounter (Signed)
Daughter wanted recent lab results. Per E. Monge: Recent labs show worsening kidney function, ongoing low serum sodium levels.  If she has not been referred already, I would like to refer her to nephrology for ongoing management.   Daughter also wanted med list updated with correct lasix dosage. Lasix '20mg'$  daily is the correct dose per 08/27/22 phone note.

## 2022-09-12 NOTE — Telephone Encounter (Signed)
Spoke with pt. Pt was notified of lab results and recommendations. Referral sent to nephrology

## 2022-09-12 NOTE — Telephone Encounter (Signed)
Patient's daughter is calling for lab results.

## 2022-09-19 ENCOUNTER — Encounter: Payer: Self-pay | Admitting: Nurse Practitioner

## 2022-09-19 ENCOUNTER — Ambulatory Visit: Payer: Medicare Other | Attending: Nurse Practitioner | Admitting: Nurse Practitioner

## 2022-09-19 VITALS — BP 113/50 | HR 70 | Ht 64.0 in | Wt 101.0 lb

## 2022-09-19 DIAGNOSIS — E785 Hyperlipidemia, unspecified: Secondary | ICD-10-CM | POA: Diagnosis not present

## 2022-09-19 DIAGNOSIS — N1832 Chronic kidney disease, stage 3b: Secondary | ICD-10-CM | POA: Diagnosis not present

## 2022-09-19 DIAGNOSIS — I428 Other cardiomyopathies: Secondary | ICD-10-CM | POA: Diagnosis not present

## 2022-09-19 DIAGNOSIS — E871 Hypo-osmolality and hyponatremia: Secondary | ICD-10-CM | POA: Diagnosis not present

## 2022-09-19 DIAGNOSIS — I2510 Atherosclerotic heart disease of native coronary artery without angina pectoris: Secondary | ICD-10-CM | POA: Diagnosis not present

## 2022-09-19 DIAGNOSIS — I5022 Chronic systolic (congestive) heart failure: Secondary | ICD-10-CM | POA: Insufficient documentation

## 2022-09-19 DIAGNOSIS — G4733 Obstructive sleep apnea (adult) (pediatric): Secondary | ICD-10-CM | POA: Insufficient documentation

## 2022-09-19 DIAGNOSIS — D649 Anemia, unspecified: Secondary | ICD-10-CM | POA: Diagnosis not present

## 2022-09-19 NOTE — Patient Instructions (Signed)
Medication Instructions:  Lasix 20 mg daily as needed only for weight gain of 3 lbs overnight or 5 lbs in 1 week or increased swelling/shortness of breath.   *If you need a refill on your cardiac medications before your next appointment, please call your pharmacy*   Lab Work: Your physician recommends that you complete labs today BMET  If you have labs (blood work) drawn today and your tests are completely normal, you will receive your results only by: MyChart Message (if you have MyChart) OR A paper copy in the mail If you have any lab test that is abnormal or we need to change your treatment, we will call you to review the results.   Testing/Procedures: NONE ordered at this time of appointment     Follow-Up: At [facility], you and your health needs are our priority.  As part of our continuing mission to provide you with exceptional heart care, we have created designated Provider Care Teams.  These Care Teams include your primary Cardiologist (physician) and Advanced Practice Providers (APPs -  Physician Assistants and Nurse Practitioners) who all work together to provide you with the care you need, when you need it.  We recommend signing up for the patient portal called "MyChart".  Sign up information is provided on this After Visit Summary.  MyChart is used to connect with patients for Virtual Visits (Telemedicine).  Patients are able to view lab/test results, encounter notes, upcoming appointments, etc.  Non-urgent messages can be sent to your provider as well.   To learn more about what you can do with MyChart, go to [email or web address].    Your next appointment:   2 month(s)  The format for your next appointment:   In Person  Provider:   Peter Martinique, MD  or Diona Browner, NP        Other Instructions   Important Information About Sugar

## 2022-09-19 NOTE — Progress Notes (Signed)
Office Visit    Patient Name: Paige Cook Date of Encounter: 09/19/2022  Primary Care Provider:  Burnard Bunting, MD Primary Cardiologist:  Peter Martinique, MD  Chief Complaint    88 year old female with a history of chronic systolic heart failure/NICM, nonobstructive CAD, PACs, hyperlipidemia, CKD stage IIIb, and OSA who presents for follow-up related to heart failure.   Past Medical History    Past Medical History:  Diagnosis Date   Acute on chronic systolic (congestive) heart failure ([place]) 08/14/2019   AKI (acute kidney injury) ([place]) 05/22/2021   Allergic rhinitis    Anemia    Arthritis    Bladder cancer ([place])    giant tumor   CAD (coronary artery disease)    Cardiac arrhythmia    benign   Carotid bruit    left   CHF (congestive heart failure) ([place])    CHF with systolic dysfuntion ; dilated nonishemic cardiomyopathy   Colon cancer ([place]) 1989   Colon polyps    Diverticulosis    Epistaxis    Female bladder prolapse    Gallstones    Heart failure (HCC)    Hypercholesteremia    Hyponatremia    Probably secondary to her CHF   OSA (obstructive sleep apnea) 04/29/2012   SOB (shortness of breath)    Sometimes at night   Past Surgical History:  Procedure Laterality Date   ABDOMINAL HYSTERECTOMY     BLADDER SURGERY  2013   cancer removed   bladder tact     CARDIAC CATHETERIZATION     non obstruction CAD   CHOLECYSTECTOMY     COLECTOMY     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    Tramadol Nausea Only and Other (See Comments)    shakes    History of Present Illness    88 year old female with the above past medical history including chronic systolic heart failure/NICM, nonobstructive CAD, PACs, hyperlipidemia, CKD stage IIIb, and OSA.   Prior cardiac catheterization in 12/2009 showed EF 30%, 40- 50% pLAD lesion, 30% mid LAD lesion, otherwise normal left circumflex, left main, and RCA.  EF in 2012 was 40%.  Subsequent echocardiogram in 2013 showed EF of 45%.  Prior Holter monitor revealed frequent PVCs, no evidence of atrial fibrillation. Echocardiogram in 07/2019 revealed EF 20%, severely dilated LV cavity, elevated LVEDP, left ventricular global hypokinesis without regional wall motion abnormalities, normal RV systolic function, moderately dilated left atrium, mildly dilated right atrium, moderate thickening of mitral valve leaflet, mild MAC, moderate mitral valve rotation, aortic valve sclerosis without any evidence of aortic stenosis.  Titration of heart failure medications has been limited due to CKD and hypotension.  She was hospitalized in August 2023 with generalized weakness, hyponatremia.  She was hydrated with normal saline, Lasix was held.  She was seen in the office in 07/2022 and noted increased shortness of breath, mild ankle edema.  Chest x-ray post hospital discharge showed increased pulmonary edema, some effusions.  Sodium and fluid restriction  was recommended (1.2 L / 36 ounces daily).  Lasix was resumed at 20 mg daily.  She was last seen in the office on 08/14/2022 and reported ongoing nonpitting bilateral lower extremity edema.  Lasix was increased to 20 mg twice daily for 3 days followed by 20 mg daily.  She was later referred to nephrology in the setting of worsening renal function and ongoing hyponatremia.  She presents today for follow-up accompanied by her daughter.  Since her last visit she has been stable from a cardiac standpoint.  She  denies any chest pain or shortness of breath.  She denies any edema, weight gain, PND, orthopnea.  She has an appointment scheduled with nephrology at the end of the month.  Overall, she reports feeling well and denies any new concerns today.  Home Medications    Current Outpatient Medications  Medication Sig Dispense Refill   carvedilol (COREG) 6.25 MG tablet TAKE 1 TABLET BY MOUTH TWICE A DAY WITH MEALS 180 tablet 2   Cholecalciferol (VITAMIN D3) 50 MCG (2000 UT) TABS Take 50 mcg by mouth.     furosemide (LASIX) 20 MG tablet Take 1 tablet (20 mg total) by mouth daily. (Patient taking differently: Take 20 mg by mouth daily as needed for fluid or edema. Lasix 20 mg daily as needed only for weight gain of 3 lbs overnight or 5 lbs in 1 week or increased swelling/shortness of breath.) 90 tablet 3   saccharomyces boulardii (FLORASTOR) 250 MG capsule Take 250 mg by mouth daily.     vitamin C (ASCORBIC ACID) 250 MG tablet Take 500 mg by mouth daily with breakfast.      No current facility-administered medications for this visit.     Review of Systems    She denies chest pain, palpitations, dyspnea, pnd, orthopnea, n, v, dizziness, syncope, edema, weight gain, or early satiety. All other systems reviewed and are otherwise negative except as noted above.   Physical Exam    VS:  BP (!) 113/50 (BP Location: Left Arm, Patient Position: Sitting, Cuff Size: Normal)   Pulse 70   Ht _0  (1.626 m)   Wt 101 lb (45.8 kg)   SpO2 97%   BMI 17.34 kg/m   GEN: Well nourished, well developed, in no acute distress. HEENT: normal. Neck: Supple, no JVD, carotid bruits, or masses. Cardiac: RRR, no murmurs, rubs, or gallops. No clubbing, cyanosis, edema.  Radials/DP/PT 2+ and equal bilaterally.  Respiratory:  Respirations regular and unlabored, clear to auscultation bilaterally. GI: Soft, nontender, nondistended, BS + x 4. MS: no deformity or atrophy. Skin: warm and dry, no rash. Neuro:  Strength and  sensation are intact. Psych: Normal affect.  Accessory Clinical Findings    ECG personally reviewed by me today -no EKG in office today.   Lab Results  Component Value Date   WBC 4.8 08/14/2022   HGB 8.4 (L) 08/14/2022   HCT 25.4 (L) 08/14/2022   MCV 99 (H) 08/14/2022   PLT 217 08/14/2022   Lab Results  Component Value Date   CREATININE 1.91 (H) 09/06/2022   BUN 94 (HH) 09/06/2022   NA 129 (L) 09/06/2022   K 4.9 09/06/2022   CL 94 (L) 09/06/2022   CO2 16 (L) 09/06/2022   Lab Results  Component Value Date   ALT 9 05/22/2021   AST 17 05/22/2021   ALKPHOS 62 05/22/2021   BILITOT 0.3 05/22/2021   Lab Results  Component Value Date   CHOL 164 01/28/2020  HDL 60 01/28/2020   LDLCALC 91 01/28/2020   LDLDIRECT 152.9 03/06/2013   TRIG 64 01/28/2020   CHOLHDL 2.7 01/28/2020    No results found for: "HGBA1C"  Assessment & Plan   1. Chronic systolic heart failure/NICM: Echo in 07/2019 revealed EF 20%, severely dilated LV cavity, elevated LVEDP, left ventricular global hypokinesis without regional wall motion abnormalities, normal RV systolic function, moderately dilated left atrium, mildly dilated right atrium, moderate thickening of mitral valve leaflet, mild MAC, moderate mitral valve rotation, aortic valve sclerosis without any evidence of aortic stenosis.  Euvolemic and well compensated on exam.  Given worsening renal function, hyponatremia and stable fluid volume status I will change her Lasix to 20 mg daily as needed for weight gain of 3 pounds overnight, 5 pounds in a week, increased swelling, shortness of breath. Advised careful monitoring of fluid volume status. Patient and patient's daughter verbalized understanding.  Nephrology referral pending.  Continue Lasix as above, carvedilol.   2. Hyponatremia: Sodium was 129 on 09/06/2022.  Will repeat BMET today.  For now, continue 1.2 L fluid restriction.  Nephrology referral pending.   3. CAD: Cath in 12/2009 showed EF 30%, 40  to 50% pLAD lesion, 30% mid LAD lesion, otherwise normal left circumflex, left main, and RCA.  Stable with no anginal symptoms. No indication for ischemic evaluation.  Continue carvedilol.    4. Hyperlipidemia: LDL was 91 in 06/2022.  Monitored and managed per PCP.  Not on statin.   5. CKD stage IIIb: Creatinine 1.91 on 09/06/2022.  Repeat BMET on nephrology consult as above.   6. Anemia: S/p recent transfusion.  Since CBC not available for review, per patient's daughter, hemoglobin was stable posttransfusion.   7. OSA: Non-adherent to CPAP.    8. Disposition: Follow-up in 2 months.     Lenna Sciara, NP 09/19/2022, 5:26 PM

## 2022-09-20 ENCOUNTER — Telehealth: Payer: Self-pay

## 2022-09-20 LAB — BASIC METABOLIC PANEL
BUN/Creatinine Ratio: 49 — ABNORMAL HIGH (ref 12–28)
BUN: 108 mg/dL (ref 10–36)
CO2: 18 mmol/L — ABNORMAL LOW (ref 20–29)
Calcium: 9.5 mg/dL (ref 8.7–10.3)
Chloride: 100 mmol/L (ref 96–106)
Creatinine, Ser: 2.2 mg/dL — ABNORMAL HIGH (ref 0.57–1.00)
Glucose: 125 mg/dL — ABNORMAL HIGH (ref 70–99)
Potassium: 5.3 mmol/L — ABNORMAL HIGH (ref 3.5–5.2)
Sodium: 133 mmol/L — ABNORMAL LOW (ref 134–144)
eGFR: 21 mL/min/{1.73_m2} — ABNORMAL LOW (ref >59)

## 2022-09-20 NOTE — Telephone Encounter (Signed)
Lmom, waiting on a return call to discuss results.

## 2022-09-21 DIAGNOSIS — M19011 Primary osteoarthritis, right shoulder: Secondary | ICD-10-CM | POA: Diagnosis not present

## 2022-09-21 NOTE — Telephone Encounter (Signed)
Spoke with pts daughter. She was notified of lab results and recommendations. Pt is following up with nephrology today.

## 2022-09-24 DIAGNOSIS — D631 Anemia in chronic kidney disease: Secondary | ICD-10-CM | POA: Diagnosis not present

## 2022-09-24 DIAGNOSIS — I2510 Atherosclerotic heart disease of native coronary artery without angina pectoris: Secondary | ICD-10-CM | POA: Diagnosis not present

## 2022-09-24 DIAGNOSIS — I130 Hypertensive heart and chronic kidney disease with heart failure and stage 1 through stage 4 chronic kidney disease, or unspecified chronic kidney disease: Secondary | ICD-10-CM | POA: Diagnosis not present

## 2022-09-24 DIAGNOSIS — I5022 Chronic systolic (congestive) heart failure: Secondary | ICD-10-CM | POA: Diagnosis not present

## 2022-09-24 DIAGNOSIS — N184 Chronic kidney disease, stage 4 (severe): Secondary | ICD-10-CM | POA: Diagnosis not present

## 2022-09-24 DIAGNOSIS — E7800 Pure hypercholesterolemia, unspecified: Secondary | ICD-10-CM | POA: Diagnosis not present

## 2022-09-27 ENCOUNTER — Other Ambulatory Visit: Payer: Self-pay | Admitting: Cardiology

## 2022-09-28 ENCOUNTER — Encounter: Payer: Self-pay | Admitting: Cardiology

## 2022-09-28 ENCOUNTER — Telehealth: Payer: Self-pay | Admitting: Cardiology

## 2022-09-28 NOTE — Telephone Encounter (Signed)
Mychart message also sent in. Will close this encounter. Please see additional encounter for more details.

## 2022-09-28 NOTE — Telephone Encounter (Signed)
Pt c/o medication issue:  1. Name of Medication:   carvedilol (COREG) 6.25 MG tablet  2. How are you currently taking this medication (dosage and times per day)?  As prescribed  3. Are you having a reaction (difficulty breathing--STAT)?   No  4. What is your medication issue?   Daughter stated patient has been dehydrated and her doctor wants to reduce this medication to 3.125 mg, twice a day.  Daughter stated she will upload results of the basic metabolic test she took today.  Daughter wants to know if Dr. Martinique agrees with this medication change and if the patient should be given more water.

## 2022-09-30 ENCOUNTER — Encounter (HOSPITAL_BASED_OUTPATIENT_CLINIC_OR_DEPARTMENT_OTHER): Payer: Self-pay

## 2022-09-30 ENCOUNTER — Emergency Department (HOSPITAL_BASED_OUTPATIENT_CLINIC_OR_DEPARTMENT_OTHER): Payer: Medicare Other | Admitting: Radiology

## 2022-09-30 ENCOUNTER — Other Ambulatory Visit: Payer: Self-pay

## 2022-09-30 ENCOUNTER — Observation Stay (HOSPITAL_BASED_OUTPATIENT_CLINIC_OR_DEPARTMENT_OTHER)
Admission: EM | Admit: 2022-09-30 | Discharge: 2022-10-02 | Disposition: A | Discharge status: Home Health | Payer: Medicare Other | Attending: Family Medicine | Admitting: Family Medicine

## 2022-09-30 DIAGNOSIS — Z7409 Other reduced mobility: Secondary | ICD-10-CM | POA: Insufficient documentation

## 2022-09-30 DIAGNOSIS — M6281 Muscle weakness (generalized): Secondary | ICD-10-CM | POA: Diagnosis not present

## 2022-09-30 DIAGNOSIS — E7800 Pure hypercholesterolemia, unspecified: Secondary | ICD-10-CM | POA: Diagnosis present

## 2022-09-30 DIAGNOSIS — Z9049 Acquired absence of other specified parts of digestive tract: Secondary | ICD-10-CM | POA: Diagnosis not present

## 2022-09-30 DIAGNOSIS — N184 Chronic kidney disease, stage 4 (severe): Secondary | ICD-10-CM | POA: Diagnosis not present

## 2022-09-30 DIAGNOSIS — I130 Hypertensive heart and chronic kidney disease with heart failure and stage 1 through stage 4 chronic kidney disease, or unspecified chronic kidney disease: Secondary | ICD-10-CM | POA: Insufficient documentation

## 2022-09-30 DIAGNOSIS — R531 Weakness: Secondary | ICD-10-CM | POA: Diagnosis present

## 2022-09-30 DIAGNOSIS — Z85038 Personal history of other malignant neoplasm of large intestine: Secondary | ICD-10-CM | POA: Insufficient documentation

## 2022-09-30 DIAGNOSIS — E875 Hyperkalemia: Secondary | ICD-10-CM | POA: Diagnosis not present

## 2022-09-30 DIAGNOSIS — R2681 Unsteadiness on feet: Secondary | ICD-10-CM | POA: Diagnosis not present

## 2022-09-30 DIAGNOSIS — I110 Hypertensive heart disease with heart failure: Secondary | ICD-10-CM | POA: Diagnosis not present

## 2022-09-30 DIAGNOSIS — I5023 Acute on chronic systolic (congestive) heart failure: Secondary | ICD-10-CM | POA: Diagnosis not present

## 2022-09-30 DIAGNOSIS — E871 Hypo-osmolality and hyponatremia: Secondary | ICD-10-CM | POA: Diagnosis not present

## 2022-09-30 DIAGNOSIS — I10 Essential (primary) hypertension: Secondary | ICD-10-CM | POA: Diagnosis present

## 2022-09-30 DIAGNOSIS — Z8551 Personal history of malignant neoplasm of bladder: Secondary | ICD-10-CM | POA: Insufficient documentation

## 2022-09-30 DIAGNOSIS — Z79899 Other long term (current) drug therapy: Secondary | ICD-10-CM | POA: Insufficient documentation

## 2022-09-30 DIAGNOSIS — I2510 Atherosclerotic heart disease of native coronary artery without angina pectoris: Secondary | ICD-10-CM | POA: Insufficient documentation

## 2022-09-30 DIAGNOSIS — Z66 Do not resuscitate: Secondary | ICD-10-CM | POA: Insufficient documentation

## 2022-09-30 DIAGNOSIS — E43 Unspecified severe protein-calorie malnutrition: Secondary | ICD-10-CM | POA: Insufficient documentation

## 2022-09-30 DIAGNOSIS — R269 Unspecified abnormalities of gait and mobility: Secondary | ICD-10-CM | POA: Insufficient documentation

## 2022-09-30 DIAGNOSIS — I509 Heart failure, unspecified: Secondary | ICD-10-CM | POA: Diagnosis not present

## 2022-09-30 DIAGNOSIS — G4733 Obstructive sleep apnea (adult) (pediatric): Secondary | ICD-10-CM | POA: Diagnosis present

## 2022-09-30 DIAGNOSIS — R079 Chest pain, unspecified: Secondary | ICD-10-CM | POA: Diagnosis not present

## 2022-09-30 LAB — BASIC METABOLIC PANEL
Anion gap: 10 (ref 5–15)
BUN: 91 mg/dL — ABNORMAL HIGH (ref 8–23)
CO2: 12 mmol/L — ABNORMAL LOW (ref 22–32)
Calcium: 9 mg/dL (ref 8.9–10.3)
Chloride: 103 mmol/L (ref 98–111)
Creatinine, Ser: 1.97 mg/dL — ABNORMAL HIGH (ref 0.44–1.00)
GFR, Estimated: 23 mL/min — ABNORMAL LOW (ref >60)
Glucose, Bld: 173 mg/dL — ABNORMAL HIGH (ref 70–99)
Potassium: 5.1 mmol/L (ref 3.5–5.1)
Sodium: 125 mmol/L — ABNORMAL LOW (ref 135–145)

## 2022-09-30 LAB — URINALYSIS, ROUTINE W REFLEX MICROSCOPIC
Bilirubin Urine: NEGATIVE
Glucose, UA: NEGATIVE mg/dL
Hgb urine dipstick: NEGATIVE
Ketones, ur: NEGATIVE mg/dL
Leukocytes,Ua: NEGATIVE
Nitrite: NEGATIVE
Specific Gravity, Urine: 1.011 (ref 1.005–1.030)
pH: 5 (ref 5.0–8.0)

## 2022-09-30 LAB — CBC
HCT: 26.6 % — ABNORMAL LOW (ref 36.0–46.0)
Hemoglobin: 9 g/dL — ABNORMAL LOW (ref 12.0–15.0)
MCH: 32.1 pg (ref 26.0–34.0)
MCHC: 33.8 g/dL (ref 30.0–36.0)
MCV: 95 fL (ref 80.0–100.0)
Platelets: 183 10*3/uL (ref 150–400)
RBC: 2.8 MIL/uL — ABNORMAL LOW (ref 3.87–5.11)
RDW: 13.4 % (ref 11.5–15.5)
WBC: 4.6 10*3/uL (ref 4.0–10.5)
nRBC: 0 % (ref 0.0–0.2)

## 2022-09-30 LAB — TROPONIN I (HIGH SENSITIVITY)
Troponin I (High Sensitivity): 54 ng/L — ABNORMAL HIGH (ref <18)
Troponin I (High Sensitivity): 69 ng/L — ABNORMAL HIGH (ref <18)

## 2022-09-30 LAB — BRAIN NATRIURETIC PEPTIDE: B Natriuretic Peptide: 2752.9 pg/mL — ABNORMAL HIGH (ref 0.0–100.0)

## 2022-09-30 LAB — MAGNESIUM: Magnesium: 2.1 mg/dL (ref 1.7–2.4)

## 2022-09-30 MED ORDER — FUROSEMIDE 10 MG/ML IJ SOLN
20.0000 mg | Freq: Once | INTRAMUSCULAR | Status: AC
  Administered 2022-09-30: 20 mg via INTRAVENOUS
  Filled 2022-09-30: qty 2

## 2022-09-30 NOTE — ED Notes (Signed)
Urine collected by [facility]

## 2022-09-30 NOTE — Progress Notes (Signed)
  [facility] will assume care on arrival to accepting facility. Until arrival, care as per EDP. However, [facility] available 24/7 for questions and assistance.   Nursing staff please page [facility] Admits and Consults ([phone]) as soon as the patient arrives to the hospital.  Latavion Halls, DO [facility]  

## 2022-09-30 NOTE — ED Provider Notes (Signed)
[facility] DEPT Provider Note   CSN: [id number] Arrival date & time: 09/30/22  1351     History  Chief Complaint  Patient presents with   Weakness    NEVE BRANSCOMB is a 88 y.o. female with HTN, HLD, NICM, CAD, OSA, PVCs, CKD stage 4, PAT, chronic CHF (EF 20%), urothelial cell carcinoma of the bladder, h/o SBO presents with weakness.  Patient presents with her daughter who provides additional history.  Patient saw her primary care physician this Friday for generalized weakness and was told to come to the emergency department if her symptoms have worsened, which they did so they came here.  They told her that her labs demonstrated dehydration. Patient has felt very weak and fatigued for approximately 1 week.  Daughter reports that her mother has seemed intermittently short of breath over the course of the last couple of days, taking shallower breaths, symptoms are worst at 5:30 in the morning after she has been laying down all night.  Has had a couple of coughing spells at night as well.  Does use a CPAP at night but in the morning she has to take it off because she feels like it is too much air.  Also intermittent central mild CP that began this AM, has had this before and it just goes away on its own.  Seems to be associated with shortness of breath, not associated with exertion, it has started when she is just sitting.  Denies any radiation of the pain. Denies any fever/chills, diaphoresis, recent illnesses, nausea vomiting diarrhea constipation, lower extremity edema.  Daughter also reports that they have discontinued her Lasix recently due to difficulty with fluid status.  Daughter reports that she has not gained any weight and that they check it every day.  Per chart review, patient has had several labs and admissions for difficulty in titrating her fluid status.  Patient has chronic systolic heart failure with EF 20% as well as CKD stage IV and chronic  hyponatremia, making her fluid status and diuresis difficult.  Has had many recent adjustments to her Lasix dosing in response to her hyponatremia, volume depletion/overload, and renal function.  Labs drawn on 09/19/2022 demonstrate sodium 133, potassium 5.3, CO2 18, BUN 108, creatinine 2.20.  Daughter reports that when she was admitted her August her sodium dropped to 118.    Weakness      Home Medications Prior to Admission medications   Medication Sig Start Date End Date Taking? Authorizing Provider  carvedilol (COREG) 6.25 MG tablet TAKE 1 TABLET BY MOUTH TWICE A DAY WITH MEALS 09/27/22   Martinique, Peter M, MD  Cholecalciferol (VITAMIN D3) 50 MCG (2000 UT) TABS Take 50 mcg by mouth.    [provider]  furosemide (LASIX) 20 MG tablet Take 1 tablet (20 mg total) by mouth daily. Patient taking differently: Take 20 mg by mouth daily as needed for fluid or edema. Lasix 20 mg daily as needed only for weight gain of 3 lbs overnight or 5 lbs in 1 week or increased swelling/shortness of breath. 07/31/22   Martinique, Peter M, MD  saccharomyces boulardii (FLORASTOR) 250 MG capsule Take 250 mg by mouth daily.    [provider]  vitamin C (ASCORBIC ACID) 250 MG tablet Take 500 mg by mouth daily with breakfast.     [provider]      Allergies    Penicillins and Tramadol    Review of Systems   Review of Systems  Neurological:  Positive for weakness.   Review of systems negative for f/c.  A 10 point review of systems was performed and is negative unless otherwise reported in HPI.  Physical Exam Updated Vital Signs BP (!) 149/65   Pulse 86   Temp 97.8 F (36.6 C) (Oral)   Resp 17   Ht '5\' 4"'$  (1.626 m)   Wt 45.8 kg   SpO2 98%   BMI 17.33 kg/m  Physical Exam General: Normal appearing female, lying in bed.  HEENT: PERRLA, Sclera anicteric, MMM, trachea midline. Cardiology: RRR, no murmurs/rubs/gallops. BL radial and DP pulses equal bilaterally.  Resp: Normal  respiratory rate and effort. Crackles bilaterally. no wheezes, rhonchi.  Abd: Soft, non-tender, non-distended. No rebound tenderness or guarding.  GU: Deferred. MSK: No peripheral edema or signs of trauma. Extremities without deformity or TTP. No cyanosis or clubbing. Skin: warm, dry. No rashes or lesions. Back: No CVA tenderness Neuro: A&Ox4, CNs II-XII grossly intact. MAEs. Sensation grossly intact.  Psych: Normal mood and affect.   ED Results / Procedures / Treatments   Labs (all labs ordered are listed, but only abnormal results are displayed) Labs Reviewed  BASIC METABOLIC PANEL - Abnormal; Notable for the following components:      Result Value   Sodium 125 (*)    CO2 12 (*)    Glucose, Bld 173 (*)    BUN 91 (*)    Creatinine, Ser 1.97 (*)    GFR, Estimated 23 (*)    All other components within normal limits  CBC - Abnormal; Notable for the following components:   RBC 2.80 (*)    Hemoglobin 9.0 (*)    HCT 26.6 (*)    All other components within normal limits  URINALYSIS, ROUTINE W REFLEX MICROSCOPIC - Abnormal; Notable for the following components:   Color, Urine COLORLESS (*)    Protein, ur TRACE (*)    All other components within normal limits  BRAIN NATRIURETIC PEPTIDE - Abnormal; Notable for the following components:   B Natriuretic Peptide 2,752.9 (*)    All other components within normal limits  TROPONIN I (HIGH SENSITIVITY) - Abnormal; Notable for the following components:   Troponin I (High Sensitivity) 54 (*)    All other components within normal limits  TROPONIN I (HIGH SENSITIVITY) - Abnormal; Notable for the following components:   Troponin I (High Sensitivity) 69 (*)    All other components within normal limits  MAGNESIUM    EKG EKG Interpretation  Date/Time:  Sunday September 30 2022 14:03:35 EDT Ventricular Rate:  87 PR Interval:  164 QRS Duration: 110 QT Interval:  388 QTC Calculation: 466 R Axis:   -79 Text Interpretation: Sinus rhythm with  occasional Premature ventricular complexes Left axis deviation Pulmonary disease pattern Septal infarct (cited on or before 25-Jul-2022) Abnormal ECG When compared with ECG of 25-Jul-2022 20:05, Right bundle branch block is no longer Present Confirmed by Regan Lemming ([id number]) on 09/30/2022 2:45:13 PM  Radiology DG Chest 2 View  Result Date: 09/30/2022 CLINICAL DATA:  Chest pain EXAM: CHEST - 2 VIEW COMPARISON:  07/25/2022 FINDINGS: Transverse diameter of heart is increased. Central pulmonary vessels appear prominent. There are no signs of alveolar pulmonary edema. There is blunting of both lateral CP angles. There is no pneumothorax. Low position of diaphragms may suggest COPD. There is decrease in height of vertebral bodies at thoracolumbar junction. IMPRESSION: Cardiomegaly. Central pulmonary vessels appear prominent suggesting possible mild CHF. There is no focal pulmonary consolidation. There is  blunting of both lateral CP angles suggesting small bilateral pleural effusions. Electronically Signed   By: Elmer Picker M.D.   On: 09/30/2022 14:33    Procedures Procedures    Medications Ordered in ED Medications  furosemide (LASIX) injection 20 mg (has no administration in time range)    ED Course/ Medical Decision Making/ A&P                          Medical Decision Making Amount and/or Complexity of Data Reviewed Labs: ordered. Decision-making details documented in ED Course. Radiology: ordered. Decision-making details documented in ED Course.  Risk Prescription drug management. Decision regarding hospitalization.    Patient is overall generally weak appearing, though nontoxic.  She is hemodynamically stable and afebrile, no hypoxia, stable on room air.  She does not have increased work of breathing at this time.  For patient's SOB/CP/orthopnea, consider worsening heart failure/pulm edema/pleural effusions, though she has not gained any weight recently or seem peripherally  volume overloaded, however symptoms seem like orthopnea, especially with recent discontinuation of her lasix. Consider ACS/arrhythmia and will get EKG/trops. Consider electrolyte abnormalities including hyponatremia given history or worsening renal function, possible dehydration. Consider infection such as pneumonia causing possible cough/chest pain, shortness of breath, or UTI given generalized weakness.  No nausea/vomiting/abdominal pain, low concern for intra-abdominal or surgical cause of infection.  Consider anemia, COVID or other systemic viral infection.  Labs demonstrate creatinine 1.97 which is at her approximate baseline, BUN 91 slightly elevated from baseline.  CO2 12, sodium 125.  Troponin 54 --> 69. Hemoglobin 9.0, higher than her baseline.  Concern for hypervolemic hyponatremia in s/o discontinuation of lasix recently.  Renal function appears to be her baseline.  Hyponatremia lower than her baseline.  We will consult with cardiology and inpatient medicine.  Patient with no active chest pain, not requiring any oxygen.  I have personally reviewed and interpreted all labs and imaging.   Clinical Course as of 09/30/22 2034  Nancy Fetter Sep 30, 2022  1651 DG Chest 2 View Cardiomegaly. Central pulmonary vessels appear prominent suggesting possible mild CHF. There is no focal pulmonary consolidation. There is blunting of both lateral CP angles suggesting small bilateral pleural effusions. [HN]  1651 Sodium(!): 125 H/o hyponatremia, most recently 133 [HN]  1652 CO2(!): 12 [HN]  1652 BUN(!): 91 [HN]  1652 Creatinine(!): 1.97 At BL [HN]  1652 Hemoglobin(!): 9.0 At BL [HN]  1652 Troponin I (High Sensitivity)(!): 54 [HN]  1903 B Natriuretic Peptide(!): 2,752.9 [HN]  1905 Troponin I (High Sensitivity)(!): 69 Increased from 54. Will consult w/ cardiology. [HN]  2030 D/w cardiology who would not recommend treating for NSTEMI at this time unless patient has active CP, which she does not.  Called  hospitalist to admit.  Patient will be given 20 mg IV lasix and admitted.  [HN]    Clinical Course User Index [HN] Audley Hose, MD          Final Clinical Impression(s) / ED Diagnoses Final diagnoses:  Hyponatremia  Acute on chronic systolic congestive heart failure ([place])    Rx / DC Orders ED Discharge Orders     None        This note was created using dictation software, which may contain spelling or grammatical errors.    Audley Hose, MD 09/30/22 2034

## 2022-09-30 NOTE — ED Notes (Signed)
Pt assisted to BR via wheelchair, one assist needed, NAD noted.

## 2022-09-30 NOTE — ED Triage Notes (Signed)
Patient here POV from Home.  Endorses having Lab Specimens collected Friday as the Patient has been feeling Weak and states she may be dehydrated. Instructed to Seek ED Evaluation if Symptoms worsened which prompted arrival today.   Also endorses CP that began this AM. No N/V/D.   NAD Noted during Triage. A&Ox4. GCS 15. BIB Wheelchair.

## 2022-10-01 ENCOUNTER — Encounter (HOSPITAL_COMMUNITY): Payer: Self-pay | Admitting: Internal Medicine

## 2022-10-01 DIAGNOSIS — I2510 Atherosclerotic heart disease of native coronary artery without angina pectoris: Secondary | ICD-10-CM | POA: Diagnosis not present

## 2022-10-01 DIAGNOSIS — I5023 Acute on chronic systolic (congestive) heart failure: Secondary | ICD-10-CM | POA: Diagnosis not present

## 2022-10-01 DIAGNOSIS — I4811 Longstanding persistent atrial fibrillation: Secondary | ICD-10-CM

## 2022-10-01 DIAGNOSIS — E43 Unspecified severe protein-calorie malnutrition: Secondary | ICD-10-CM | POA: Diagnosis not present

## 2022-10-01 DIAGNOSIS — I428 Other cardiomyopathies: Secondary | ICD-10-CM | POA: Diagnosis not present

## 2022-10-01 DIAGNOSIS — N1832 Chronic kidney disease, stage 3b: Secondary | ICD-10-CM

## 2022-10-01 DIAGNOSIS — E871 Hypo-osmolality and hyponatremia: Secondary | ICD-10-CM | POA: Diagnosis not present

## 2022-10-01 DIAGNOSIS — R2681 Unsteadiness on feet: Secondary | ICD-10-CM | POA: Diagnosis not present

## 2022-10-01 DIAGNOSIS — Z66 Do not resuscitate: Secondary | ICD-10-CM | POA: Diagnosis not present

## 2022-10-01 DIAGNOSIS — N184 Chronic kidney disease, stage 4 (severe): Secondary | ICD-10-CM | POA: Diagnosis not present

## 2022-10-01 DIAGNOSIS — E875 Hyperkalemia: Secondary | ICD-10-CM | POA: Diagnosis not present

## 2022-10-01 DIAGNOSIS — I5043 Acute on chronic combined systolic (congestive) and diastolic (congestive) heart failure: Secondary | ICD-10-CM | POA: Diagnosis not present

## 2022-10-01 DIAGNOSIS — R269 Unspecified abnormalities of gait and mobility: Secondary | ICD-10-CM | POA: Diagnosis not present

## 2022-10-01 DIAGNOSIS — Z9049 Acquired absence of other specified parts of digestive tract: Secondary | ICD-10-CM | POA: Diagnosis not present

## 2022-10-01 DIAGNOSIS — M6281 Muscle weakness (generalized): Secondary | ICD-10-CM | POA: Diagnosis not present

## 2022-10-01 DIAGNOSIS — E7800 Pure hypercholesterolemia, unspecified: Secondary | ICD-10-CM | POA: Diagnosis not present

## 2022-10-01 DIAGNOSIS — I130 Hypertensive heart and chronic kidney disease with heart failure and stage 1 through stage 4 chronic kidney disease, or unspecified chronic kidney disease: Secondary | ICD-10-CM | POA: Diagnosis not present

## 2022-10-01 DIAGNOSIS — Z8551 Personal history of malignant neoplasm of bladder: Secondary | ICD-10-CM | POA: Diagnosis not present

## 2022-10-01 DIAGNOSIS — Z79899 Other long term (current) drug therapy: Secondary | ICD-10-CM | POA: Diagnosis not present

## 2022-10-01 DIAGNOSIS — Z85038 Personal history of other malignant neoplasm of large intestine: Secondary | ICD-10-CM | POA: Diagnosis not present

## 2022-10-01 DIAGNOSIS — Z7409 Other reduced mobility: Secondary | ICD-10-CM | POA: Diagnosis not present

## 2022-10-01 DIAGNOSIS — R531 Weakness: Secondary | ICD-10-CM | POA: Diagnosis present

## 2022-10-01 LAB — BASIC METABOLIC PANEL
Anion gap: 9 (ref 5–15)
BUN: 92 mg/dL — ABNORMAL HIGH (ref 8–23)
CO2: 14 mmol/L — ABNORMAL LOW (ref 22–32)
Calcium: 9.2 mg/dL (ref 8.9–10.3)
Chloride: 108 mmol/L (ref 98–111)
Creatinine, Ser: 1.86 mg/dL — ABNORMAL HIGH (ref 0.44–1.00)
GFR, Estimated: 25 mL/min — ABNORMAL LOW (ref >60)
Glucose, Bld: 121 mg/dL — ABNORMAL HIGH (ref 70–99)
Potassium: 4.9 mmol/L (ref 3.5–5.1)
Sodium: 131 mmol/L — ABNORMAL LOW (ref 135–145)

## 2022-10-01 MED ORDER — ACETAMINOPHEN 650 MG RE SUPP: 650.0000 mg | Freq: Four times a day (QID) | RECTAL | Status: DC | PRN

## 2022-10-01 MED ORDER — ACETAMINOPHEN 325 MG PO TABS: 650.0000 mg | ORAL_TABLET | Freq: Four times a day (QID) | ORAL | Status: DC | PRN

## 2022-10-01 MED ORDER — SODIUM CHLORIDE 0.9% FLUSH
3.0000 mL | Freq: Two times a day (BID) | INTRAVENOUS | Status: DC
  Administered 2022-10-01 – 2022-10-02 (×3): 3 mL via INTRAVENOUS

## 2022-10-01 MED ORDER — ONDANSETRON HCL 4 MG/2ML IJ SOLN: 4.0000 mg | Freq: Four times a day (QID) | INTRAMUSCULAR | Status: DC | PRN

## 2022-10-01 MED ORDER — ENOXAPARIN SODIUM 30 MG/0.3ML IJ SOSY
30.0000 mg | PREFILLED_SYRINGE | INTRAMUSCULAR | Status: DC
  Administered 2022-10-01: 30 mg via SUBCUTANEOUS
  Filled 2022-10-01 (×2): qty 0.3

## 2022-10-01 MED ORDER — FUROSEMIDE 20 MG PO TABS
20.0000 mg | ORAL_TABLET | Freq: Every day | ORAL | Status: DC
  Administered 2022-10-01: 20 mg via ORAL
  Filled 2022-10-01: qty 1

## 2022-10-01 MED ORDER — HYDRALAZINE HCL 20 MG/ML IJ SOLN: 5.0000 mg | INTRAMUSCULAR | Status: DC | PRN

## 2022-10-01 MED ORDER — BISACODYL 5 MG PO TBEC: 5.0000 mg | DELAYED_RELEASE_TABLET | Freq: Every day | ORAL | Status: DC | PRN

## 2022-10-01 MED ORDER — DOCUSATE SODIUM 100 MG PO CAPS
100.0000 mg | ORAL_CAPSULE | Freq: Two times a day (BID) | ORAL | Status: DC
  Administered 2022-10-01 (×2): 100 mg via ORAL
  Filled 2022-10-01 (×3): qty 1

## 2022-10-01 MED ORDER — TRAZODONE HCL 50 MG PO TABS: 25.0000 mg | ORAL_TABLET | Freq: Every evening | ORAL | Status: DC | PRN

## 2022-10-01 MED ORDER — CARVEDILOL 6.25 MG PO TABS
6.2500 mg | ORAL_TABLET | Freq: Two times a day (BID) | ORAL | Status: DC
  Administered 2022-10-01 – 2022-10-02 (×3): 6.25 mg via ORAL
  Filled 2022-10-01 (×3): qty 1

## 2022-10-01 MED ORDER — POLYETHYLENE GLYCOL 3350 17 G PO PACK: 17.0000 g | PACK | Freq: Every day | ORAL | Status: DC | PRN

## 2022-10-01 MED ORDER — ONDANSETRON HCL 4 MG PO TABS: 4.0000 mg | ORAL_TABLET | Freq: Four times a day (QID) | ORAL | Status: DC | PRN

## 2022-10-01 NOTE — ED Notes (Addendum)
Pt assisted to BR at this time

## 2022-10-01 NOTE — Evaluation (Signed)
Occupational Therapy Evaluation Patient Details Name: Paige Cook MRN: [id number] DOB: 1928-02-08 Today's Date: 10/01/2022   History of Present Illness Pt is 88 yo female admitted on 09/30/22 with weakness, hyponatremia, and CHF.  Pt with hx of  HTN, HLD, NICM, CAD, OSA, PVCs, CKD stage 4, PAT, chronic CHF (EF 20%), urothelial cell carcinoma of the bladder, h/o SBO   Clinical Impression   Pt reports use of rollator at baseline, family assisting with IADLs at home, supervision for basic ADLs. Pt currently needing min guard-min A for ADLs, supervision for bed mobility, and min guard for transfers with RW. Pt with mild unsteadiness, min cues to keep RW close to body. Pt presenting with impairments listed below, will follow acutely. Recommend HHOT at d/c.     Recommendations for follow up therapy are one component of a multi-disciplinary discharge planning process, led by the attending physician.  Recommendations may be updated based on patient status, additional functional criteria and insurance authorization.   Follow Up Recommendations  Home health OT    Assistance Recommended at Discharge Intermittent Supervision/Assistance  Patient can return home with the following A little help with walking and/or transfers;A little help with bathing/dressing/bathroom;Assistance with cooking/housework;Direct supervision/assist for medications management;Direct supervision/assist for financial management;Help with stairs or ramp for entrance;Assist for transportation    Functional Status Assessment  Patient has had a recent decline in their functional status and demonstrates the ability to make significant improvements in function in a reasonable and predictable amount of time.  Equipment Recommendations  BSC/3in1    Recommendations for Other Services PT consult     Precautions / Restrictions Precautions Precautions: Fall Restrictions Weight Bearing Restrictions: No      Mobility Bed  Mobility Overal bed mobility: Needs Assistance Bed Mobility: Sit to Supine       Sit to supine: Supervision        Transfers Overall transfer level: Needs assistance Equipment used: Rolling walker (2 wheels) Transfers: Sit to/from Stand Sit to Stand: Min guard                  Balance Overall balance assessment: Needs assistance Sitting-balance support: No upper extremity supported Sitting balance-Leahy Scale: Good Sitting balance - Comments: able to don shoes and socks at EOB   Standing balance support: Bilateral upper extremity supported, Reliant on assistive device for balance Standing balance-Leahy Scale: Poor Standing balance comment: steady with RW                           ADL either performed or assessed with clinical judgement   ADL Overall ADL's : Needs assistance/impaired Eating/Feeding: Set up   Grooming: Min guard;Standing   Upper Body Bathing: Minimal assistance   Lower Body Bathing: Minimal assistance   Upper Body Dressing : Minimal assistance   Lower Body Dressing: Minimal assistance   Toilet Transfer: Min guard;Ambulation;Regular Museum/gallery exhibitions officer and Hygiene: Min guard       Functional mobility during ADLs: Min guard       Vision   Vision Assessment?: No apparent visual deficits     Perception     Praxis      Pertinent Vitals/Pain Pain Assessment Pain Assessment: No/denies pain     Hand Dominance Right   Extremity/Trunk Assessment Upper Extremity Assessment Upper Extremity Assessment: Generalized weakness   Lower Extremity Assessment Lower Extremity Assessment: Defer to PT evaluation RLE Deficits / Details: ROM WFL; MMT 4+/5 LLE Deficits /  Details: ROM WFL; MMT 4+/5   Cervical / Trunk Assessment Cervical / Trunk Assessment: Kyphotic   Communication     Cognition Arousal/Alertness: Awake/alert Behavior During Therapy: WFL for tasks assessed/performed Overall Cognitive Status:  Within Functional Limits for tasks assessed                                       General Comments  VSS on RA    Exercises     Shoulder Instructions      Home Living Family/patient expects to be discharged to:: Private residence Living Arrangements: Alone Available Help at Discharge: Family;Friend(s);Personal care attendant;Available 24 hours/day Type of Home: House Home Access: Stairs to enter [employer] of Steps: 5 Entrance Stairs-Rails: Right;Left Home Layout: Multi-level Alternate Level Stairs-Number of Steps: 3 steps to get down to the [employer], flight of stairs to second floor. Pt's bedroom is on main level   Bathroom Shower/Tub: Occupational psychologist: Standard     Home Equipment: Rollator (4 wheels);Cane - single point;Shower seat;Grab bars - toilet;Grab bars - tub/shower;Hand held shower head;Wheelchair - manual   Additional Comments: was recently getting HHPT      Prior Functioning/Environment Prior Level of Function : Needs assist             Mobility Comments: In house uses rollator or cane; family assist on steps; normally ambulates short community distances; was walking half mile a day for exercise up unitl August ADLs Comments: Family assist with IADLS; pt able to dress and bath with supervision        OT Problem List: Decreased strength;Decreased range of motion;Decreased activity tolerance;Impaired balance (sitting and/or standing)      OT Treatment/Interventions: Self-care/ADL training;Therapeutic exercise;Energy conservation;DME and/or AE instruction;Cognitive remediation/compensation;Patient/family education;Balance training    OT Goals(Current goals can be found in the care plan section) Acute Rehab OT Goals Patient Stated Goal: none stated OT Goal Formulation: With patient Time For Goal Achievement: 10/15/22 Potential to Achieve Goals: Good ADL Goals Pt Will Perform Upper Body Dressing: with  modified independence;sitting Pt Will Perform Lower Body Dressing: with modified independence;sit to/from stand Pt Will Transfer to Toilet: with modified independence;ambulating;regular height toilet Pt Will Perform Tub/Shower Transfer: Tub transfer;Shower transfer;with modified independence;shower seat;ambulating  OT Frequency: Min 2X/week    Co-evaluation              AM-PAC OT "6 Clicks" Daily Activity     Outcome Measure Help from another person eating meals?: None Help from another person taking care of personal grooming?: A Little Help from another person toileting, which includes using toliet, bedpan, or urinal?: A Little Help from another person bathing (including washing, rinsing, drying)?: A Little Help from another person to put on and taking off regular upper body clothing?: A Little Help from another person to put on and taking off regular lower body clothing?: A Little 6 Click Score: 19   End of Session Equipment Utilized During Treatment: Rolling walker (2 wheels);Gait belt Nurse Communication: Mobility status  Activity Tolerance: Patient tolerated treatment well Patient left: in bed;with call bell/phone within reach;with family/visitor present;with bed alarm set  OT Visit Diagnosis: Unsteadiness on feet (R26.81);Other abnormalities of gait and mobility (R26.89);Muscle weakness (generalized) (M62.81)                Time: 6644-0347 OT Time Calculation (min): 14 min Charges:  OT General Charges $OT Visit:  1 Visit OT Evaluation $OT Eval Low Complexity: 1 Low Lynnda Child, OTD, OTR/L Acute Rehab [phone] [place] 10/01/2022, 3:15 PM

## 2022-10-01 NOTE — Evaluation (Signed)
Physical Therapy Evaluation Patient Details Name: Paige Cook MRN: [id number] DOB: 06-30-1928 Today's Date: 10/01/2022  History of Present Illness  Pt is 88 yo female admitted on 09/30/22 with weakness, hyponatremia, and CHF.  Pt with hx of  HTN, HLD, NICM, CAD, OSA, PVCs, CKD stage 4, PAT, chronic CHF (EF 20%), urothelial cell carcinoma of the bladder, h/o SBO  Clinical Impression  Pt admitted with above diagnosis. At baseline, pt has 24 hr care, ambulates with AD, and has supervision for ADLS.  She does live in a multilevel home and has supervision/assist with stairs.  Today, pt requiring min guard and ambulated 180' with RW and cues.  Reports some fatigue but overall tolerated well.  Expected to progress well. Pt currently with functional limitations due to the deficits listed below (see PT Problem List). Pt will benefit from skilled PT to increase their independence and safety with mobility to allow discharge to the venue listed below.          Recommendations for follow up therapy are one component of a multi-disciplinary discharge planning process, led by the attending physician.  Recommendations may be updated based on patient status, additional functional criteria and insurance authorization.  Follow Up Recommendations Home health PT      Assistance Recommended at Discharge Intermittent Supervision/Assistance  Patient can return home with the following  A little help with walking and/or transfers;A little help with bathing/dressing/bathroom;Assistance with cooking/housework;Help with stairs or ramp for entrance    Equipment Recommendations None recommended by PT  Recommendations for Other Services       Functional Status Assessment Patient has had a recent decline in their functional status and demonstrates the ability to make significant improvements in function in a reasonable and predictable amount of time.     Precautions / Restrictions Precautions Precautions: Fall       Mobility  Bed Mobility Overal bed mobility: Needs Assistance Bed Mobility: Supine to Sit     Supine to sit: Supervision, HOB elevated          Transfers Overall transfer level: Needs assistance Equipment used: Rolling walker (2 wheels) Transfers: Sit to/from Stand Sit to Stand: Min guard                Ambulation/Gait Ambulation/Gait assistance: Min guard Gait Distance (Feet): 180 Feet Assistive device: Rolling walker (2 wheels) Gait Pattern/deviations: Step-through pattern, Decreased stride length, Trunk flexed Gait velocity: decreased     General Gait Details: Min cues with RW for turns (pt used to rollator)  Financial trader Rankin (Stroke Patients Only)       Balance Overall balance assessment: Needs assistance Sitting-balance support: No upper extremity supported Sitting balance-Leahy Scale: Good Sitting balance - Comments: able to don shoes and socks at EOB   Standing balance support: Bilateral upper extremity supported, Reliant on assistive device for balance Standing balance-Leahy Scale: Poor Standing balance comment: steady with RW                             Pertinent Vitals/Pain Pain Assessment Pain Assessment: No/denies pain    Home Living Family/patient expects to be discharged to:: Private residence Living Arrangements: Alone Available Help at Discharge: Family;Friend(s);Personal care attendant;Available 24 hours/day Type of Home: House Home Access: Stairs to enter Entrance Stairs-Rails: Right;Left Entrance Stairs-Number of Steps: 5 Alternate Level Stairs-Number of Steps: 3 steps  to get down to the [employer], flight of stairs to second floor. Pt's bedroom is on main level Home Layout: Multi-level Home Equipment: Rollator (4 wheels);Cane - single point;Shower seat;Grab bars - toilet;Grab bars - tub/shower;Hand held shower head;Wheelchair - manual Additional Comments: was  recently getting HHPT    Prior Function Prior Level of Function : Needs assist             Mobility Comments: In house uses rollator or cane; family assist on steps; normally ambulates short community distances; was walking half mile a day for exercise up unitl August ADLs Comments: Family assist with IADLS; pt able to dress and bath with supervision     Hand Dominance        Extremity/Trunk Assessment   Upper Extremity Assessment Upper Extremity Assessment: Defer to OT evaluation    Lower Extremity Assessment Lower Extremity Assessment: LLE deficits/detail;RLE deficits/detail RLE Deficits / Details: ROM WFL; MMT 4+/5 LLE Deficits / Details: ROM WFL; MMT 4+/5    Cervical / Trunk Assessment Cervical / Trunk Assessment: Kyphotic  Communication      Cognition Arousal/Alertness: Awake/alert Behavior During Therapy: WFL for tasks assessed/performed Overall Cognitive Status: Within Functional Limits for tasks assessed                                          General Comments General comments (skin integrity, edema, etc.): VSS    Exercises     Assessment/Plan    PT Assessment Patient needs continued PT services  PT Problem List Decreased strength;Decreased mobility;Decreased activity tolerance;Cardiopulmonary status limiting activity;Decreased balance;Decreased knowledge of use of DME       PT Treatment Interventions DME instruction;Therapeutic activities;Gait training;Therapeutic exercise;Patient/family education;Stair training;Balance training;Functional mobility training    PT Goals (Current goals can be found in the Care Plan section)  Acute Rehab PT Goals Patient Stated Goal: return home PT Goal Formulation: With patient/family Time For Goal Achievement: 10/15/22 Potential to Achieve Goals: Good    Frequency Min 3X/week     Co-evaluation               AM-PAC PT "6 Clicks" Mobility  Outcome Measure Help needed turning from your  back to your side while in a flat bed without using bedrails?: A Little Help needed moving from lying on your back to sitting on the side of a flat bed without using bedrails?: A Little Help needed moving to and from a bed to a chair (including a wheelchair)?: A Little Help needed standing up from a chair using your arms (e.g., wheelchair or bedside chair)?: A Little Help needed to walk in hospital room?: A Little Help needed climbing 3-5 steps with a railing? : A Little 6 Click Score: 18    End of Session   Activity Tolerance: Patient tolerated treatment well Patient left: in bed;with call bell/phone within reach (OT present) Nurse Communication: Mobility status PT Visit Diagnosis: Other abnormalities of gait and mobility (R26.89);Muscle weakness (generalized) (M62.81)    Time: 1610-9604 PT Time Calculation (min) (ACUTE ONLY): 18 min   Charges:   PT Evaluation $PT Eval Low Complexity: 1 Low          Kinlee Garrison, PT Acute Rehab [facility] [phone]   Karlton Lemon 10/01/2022, 2:31 PM

## 2022-10-01 NOTE — H&P (Signed)
History and Physical    Patient: Paige Cook CHE:[id number] DOB: [birth year] DOA: 09/30/2022 DOS: the patient was seen and examined on 10/01/2022 PCP: Burnard Bunting, MD  Patient coming from: Home - lives alone, has 24/7 health care assistance; NOK: Daughter, Joanette Gula, [phone]   Chief Complaint: Weakness  HPI: Paige Cook is a 88 y.o. female with medical history significant of chronic systolic CHF; CAD;  remote colon cancer; chronic hyponatremia; HLD; urothelial cell bladder cancer; and OSA on CPAP presenting with weakness. The patient was unable to provide much history and was oriented x 2.  She acknowledges some chest pain and feeling short-winded.  Her daughter reports that she has been a bit confused since her last hospitalization (in August).  [facility] went to [facility] Friday for weakness; they said she was dehydrated and if she got worse they should bring her to the ER.  She had swallow breathing and mild chest pain overnight.  No weight gain, were told she has fluid on her lungs.  She had low sodium.  She was stopped on Lasix about a week and half.  She complained about some SOB once overnight but otherwise has not been complaining.  She is very weak, needs help getting up.  They would like for a cardiologist to see her here, and maybe also a kidney doctor - has never seen before. She has had home therapy but finished last week.      ER Course:  Carryover, per Dr. Bridgett Larsson:  88 yo female with hx of chronic hyponatremia(baseline Na 125-130) , CHF EF 20%, CKD stage 3b, with SOB. lasix recently changed to prn by cardiology visit 09-19-2022. not requiring any supplemental O2.  CXR shows pulmonary edema. Will need cardiology consult      Review of Systems: As mentioned in the history of present illness. All other systems reviewed and are negative.  Limited by cognitive impairment  Past Medical History:  Diagnosis Date   AKI (acute kidney injury) ([place]) 05/22/2021    Allergic rhinitis    Anemia    Arthritis    Bladder cancer ([place])    giant tumor   CAD (coronary artery disease)    Cardiac arrhythmia    benign   Carotid bruit    left   CHF (congestive heart failure) ([place])    CHF with systolic dysfuntion ; dilated nonishemic cardiomyopathy   Colon cancer ([place]) 1989   Colon polyps    Diverticulosis    Epistaxis    Female bladder prolapse    Gallstones    Hypercholesteremia    Hyponatremia    Probably secondary to her CHF   OSA (obstructive sleep apnea) 04/29/2012   Past Surgical History:  Procedure Laterality Date   ABDOMINAL HYSTERECTOMY     BLADDER SURGERY  2013   cancer removed   bladder tact     CARDIAC CATHETERIZATION     non obstruction CAD   CHOLECYSTECTOMY     COLECTOMY     sigmoid 1989 cancer   COLONOSCOPY     Social History:  reports that she has never smoked. She has never used smokeless tobacco. She reports that she does not drink alcohol and does not use drugs.  Allergies  Allergen Reactions   Penicillins Hives    Has patient had a PCN reaction causing immediate rash, facial/tongue/throat swelling, SOB or lightheadedness with hypotension: Yes Has patient had a PCN reaction causing severe rash involving mucus membranes or skin necrosis: Yes Has patient had a  PCN reaction that required hospitalization No Has patient had a PCN reaction occurring within the last 10 years: No If all of the above answers are "NO", then may proceed with Cephalosporin use.    Ultram [Tramadol] Nausea Only and Other (See Comments)    Tremors     Family History  Problem Relation Age of Onset   Heart disease Father    Heart attack Father    Heart attack Mother    Heart failure Sister 21   Stroke Neg Hx     Prior to Admission medications   Medication Sig Start Date End Date Taking? Authorizing Provider  hydrALAZINE (APRESOLINE) 10 MG tablet Take 10 mg by mouth 3 (three) times daily. 09/30/22  Yes [provider]  carvedilol  (COREG) 6.25 MG tablet TAKE 1 TABLET BY MOUTH TWICE A DAY WITH MEALS 09/27/22   Martinique, Peter M, MD  Cholecalciferol (VITAMIN D3) 50 MCG (2000 UT) TABS Take 50 mcg by mouth.    [provider]  furosemide (LASIX) 20 MG tablet Take 1 tablet (20 mg total) by mouth daily. Patient taking differently: Take 20 mg by mouth daily as needed for fluid or edema. Lasix 20 mg daily as needed only for weight gain of 3 lbs overnight or 5 lbs in 1 week or increased swelling/shortness of breath. 07/31/22   Martinique, Peter M, MD  saccharomyces boulardii (FLORASTOR) 250 MG capsule Take 250 mg by mouth daily.    [provider]  vitamin C (ASCORBIC ACID) 250 MG tablet Take 500 mg by mouth daily with breakfast.     [provider]    Physical Exam: Vitals:   10/01/22 0800 10/01/22 1000 10/01/22 1047 10/01/22 1643  BP: (!) 140/66 (!) 145/68 135/63 (!) 136/57  Pulse: 72 82 75 76  Resp: 16 17 (!) 21 20  Temp:  97.8 F (36.6 C) (!) 97.5 F (36.4 C) 97.8 F (36.6 C)  TempSrc:  Oral Oral Oral  SpO2: 97% 95% 98% 98%  Weight:      Height:       General:  Appears calm and comfortable and is in NAD, confused, on RA Eyes:   EOMI, normal lids, iris ENT:  grossly normal hearing, lips & tongue, mmm Neck:  no LAD, masses or thyromegaly Cardiovascular:  RRR, no m/r/g. No LE edema.  Respiratory:   CTA bilaterally with no wheezes/rales/rhonchi.  Normal respiratory effort. Abdomen:  soft, NT, ND Skin:  no rash or induration seen on limited exam Musculoskeletal:  grossly normal tone BUE/BLE, good ROM, no bony abnormality Psychiatric:  mildly confused but pleasant mood and affect, speech fluent and appropriate, AOx2 Neurologic:  CN 2-12 grossly intact, moves all extremities in coordinated fashion   Radiological Exams on Admission: Independently reviewed - see discussion in A/P where applicable  DG Chest 2 View  Result Date: 09/30/2022 CLINICAL DATA:  Chest pain EXAM: CHEST - 2 VIEW  COMPARISON:  07/25/2022 FINDINGS: Transverse diameter of heart is increased. Central pulmonary vessels appear prominent. There are no signs of alveolar pulmonary edema. There is blunting of both lateral CP angles. There is no pneumothorax. Low position of diaphragms may suggest COPD. There is decrease in height of vertebral bodies at thoracolumbar junction. IMPRESSION: Cardiomegaly. Central pulmonary vessels appear prominent suggesting possible mild CHF. There is no focal pulmonary consolidation. There is blunting of both lateral CP angles suggesting small bilateral pleural effusions. Electronically Signed   By: Elmer Picker M.D.   On: 09/30/2022 14:33  EKG: Independently reviewed.  NSR with rate 87; IVCD; nonspecific ST changes with no evidence of acute ischemia   Labs on Admission: I have personally reviewed the available labs and imaging studies at the time of the admission.  Pertinent labs:    Na++ 125 -> 131; 130 appears to be recent baseline CO2 12-> 14 Glucose 173 BUN 91/Creatinine 1.97/GFR 23 - stable BNP 2752.9 - stable HS troponin 54, 69 WBC 4.6 Hgb 9.0 - stable UA: trace protein   Assessment and Plan: Principal Problem:   Acute on chronic systolic CHF (congestive heart failure) (HCC) Active Problems:   Hypercholesteremia   OSA (obstructive sleep apnea)   CKD (chronic kidney disease) stage 4, GFR 15-29 ml/min (HCC)   HTN (hypertension)   Chronic hyponatremia    Acute on chronic systolic CHF -Patient with known h/o chronic systolic CHF presenting with worsening SOB and mild chest pain -CXR consistent with mild pulmonary edema -Elevated BNP consistent with prior -With elevated BNP and abnl CXR, acute decompensated CHF seems probable as diagnosis -Will place in observation status with telemetry, as there are no current findings necessitating admission (hemodynamic instability, severe electrolyte abnormalities, cardiac arrhythmias, ACS, severe pulmonary edema  requiring new O2 therapy, AMS) -Will request echocardiogram -CHF order set utilized -Cardiology consult -Was given Lasix 20 mg IV x 1 in ER and will repeat resume home Lasix at 20 mg daily for now - although dose adjustment may be needed given her recent difficulty with fluid vs. Creatinine balance -PT/OT, nutrition, TOC consults requested  Hyponatremia -Likely related to hypernatremia -This has now normalized with diuresis -Recheck in AM  Stage 4 CKD -Appears to be stable at this time -She is already scheduled for outpatient nephrology f/u -Inpatient nephrology evaluation does not appear to be needed at this time -She may be nearing cardiorenal syndrome but is unlikely to be a candidate for HD regardless -Consider palliative care evaluation  HTN -Continue home carvedilol -No longer taking PO hydralazine -Will also add prn IV hydralazine  OSA -Continue CPAP  DNR -I have discussed code status with the patient's daughter; she would not desire resuscitation and would prefer to die a natural death should that situation arise. -She will need a gold out of facility DNR form at the time of discharge     Advance Care Planning:   Code Status: DNR   Consults: Cardiology; PT/OT; [facility] team; Nutrition; CHF navigator  DVT Prophylaxis: Lovenox  Family Communication: None present; I spoke with her daughter by telephone at the time of admission  Severity of Illness: The appropriate patient status for this patient is OBSERVATION. Observation status is judged to be reasonable and necessary in order to provide the required intensity of service to ensure the patient's safety. The patient's presenting symptoms, physical exam findings, and initial radiographic and laboratory data in the context of their medical condition is felt to place them at decreased risk for further clinical deterioration. Furthermore, it is anticipated that the patient will be medically stable for discharge from the hospital  within 2 midnights of admission.   Author: Karmen Bongo, MD 10/01/2022 6:31 PM  For on call review [email or web address].

## 2022-10-01 NOTE — Consult Note (Addendum)
Cardiology Consultation   Patient ID: Paige Cook MRN: [id number]; DOB: Sep 26, 1928  Admit date: 09/30/2022 Date of Consult: 10/01/2022  PCP:  Burnard Bunting, MD   [facility] Cardiologist:  Peter Martinique, MD        Patient Profile:   Paige Cook is a 88 y.o. female with a hx of nonischemic cardiomyopathy, PACs, CAD, hyperlipidemia, and OSA on CPAP. Previous cardiac catheterization on 01/10/2010 showed EF 30%, 40 to 50% proximal LAD lesion, 30% mid LAD lesion, otherwise normal left circumflex, and left main and RCA. EF of 40% in 2012, echocardiogram in 2013 showed EF of 45%. 2020 EF was 20%. Hx very frequent PACs on the Holter monitor however no atrial fibrillation was identified.    She is being seen 10/01/2022 for the evaluation of CHF at the request of Dr Lorin Mercy.  History of Present Illness:   [name] feels that she has been getting weaker over the last few days.  She had blood work done at her PCPs office and they stopped her diuretics.  Recently, her BUN/creatinine was 108/2.20, higher than her baseline.  Upon arrival, her BUN/creatinine was 91/1.97  She then started getting weaker and developed increasing shortness of breath.  She does not weigh.  She has 24/7 care from her daughters and caregivers.  She developed chest pain as well as the shortness of breath and weakness and was told to come to the hospital.  She has chest wall tenderness at the bottom edge of the sternum, but the chest pain has resolved.  The pain was worse with deep inspiration.   She got Lasix 20 mg IV last p.m., she feels she is breathing better today.  She is still very weak.   Past Medical History:  Diagnosis Date   AKI (acute kidney injury) ([place]) 05/22/2021   Allergic rhinitis    Anemia    Arthritis    Bladder cancer ([place])    giant tumor   CAD (coronary artery disease)    Cardiac arrhythmia    benign   Carotid bruit    left   CHF (congestive heart failure)  ([place])    CHF with systolic dysfuntion ; dilated nonishemic cardiomyopathy   Colon cancer ([place]) 1989   Colon polyps    Diverticulosis    Epistaxis    Female bladder prolapse    Gallstones    Hypercholesteremia    Hyponatremia    Probably secondary to her CHF   OSA (obstructive sleep apnea) 04/29/2012    Past Surgical History:  Procedure Laterality Date   ABDOMINAL HYSTERECTOMY     BLADDER SURGERY  2013   cancer removed   bladder tact     CARDIAC CATHETERIZATION     non obstruction CAD   CHOLECYSTECTOMY     COLECTOMY     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 Authorizing Provider  acetaminophen (TYLENOL) 650 MG CR tablet Take 650 mg by mouth every 6 (six) hours as needed for pain.   Yes [provider]  ascorbic acid (VITAMIN C) 500 MG tablet Take 500 mg by mouth daily.   Yes [provider]  carvedilol (COREG) 6.25 MG tablet TAKE 1 TABLET BY MOUTH TWICE A DAY WITH MEALS Patient taking differently: Take 6.25 mg by mouth 2 (two) times daily. 09/27/22  Yes Martinique, Peter M, MD  Cholecalciferol (VITAMIN  D3) 50 MCG (2000 UT) TABS Take 50 mcg by mouth every other day.   Yes [provider]  furosemide (LASIX) 20 MG tablet Take 1 tablet (20 mg total) by mouth daily. Patient not taking: Reported on 10/01/2022 07/31/22   Martinique, Peter M, MD  hydrALAZINE (APRESOLINE) 10 MG tablet Take 10 mg by mouth 3 (three) times daily. Patient not taking: Reported on 10/01/2022 09/30/22   [provider]    Inpatient Medications: Scheduled Meds:  carvedilol  6.25 mg Oral BID   docusate sodium  100 mg Oral BID   enoxaparin (LOVENOX) injection  30 mg Subcutaneous Q24H   furosemide  20 mg Oral Daily   sodium chloride flush  3 mL Intravenous Q12H   Continuous Infusions:  PRN Meds: acetaminophen **OR** acetaminophen, bisacodyl, hydrALAZINE, ondansetron **OR**  ondansetron (ZOFRAN) IV, polyethylene glycol, traZODone  Allergies:    Allergies  Allergen Reactions   Penicillins Hives    Has patient had a PCN reaction causing immediate rash, facial/tongue/throat swelling, SOB or lightheadedness with hypotension: Yes Has patient had a PCN reaction causing severe rash involving mucus membranes or skin necrosis: Yes Has patient had a PCN reaction that required hospitalization No Has patient had a PCN reaction occurring within the last 10 years: No If all of the above answers are "NO", then may proceed with Cephalosporin use.    Ultram [Tramadol] Nausea Only and Other (See Comments)    Tremors     Social History:   Social History   Socioeconomic History   Marital status: Married    Spouse name: Not on file   Number of children: 4   Years of education: Not on file   Highest education level: Not on file  Occupational History   Occupation: retired Paramedic  Tobacco Use   Smoking status: Never   Smokeless tobacco: Never  Substance and Sexual Activity   Alcohol use: No   Drug use: No   Sexual activity: Not Currently  Other Topics Concern   Not on file  Social History Narrative   Married, 1 son and 3 daughters.    One coffee a day no alcohol no tobacco   05/05/2015   Social Determinants of Health   Financial Resource Strain: Not on file  Food Insecurity: Unknown (10/01/2022)   Hunger Vital Sign    Worried About Running Out of Food in the Last Year: Patient refused    [place] in the Last Year: Patient refused  Transportation Needs: Unknown (10/01/2022)   PRAPARE - Hydrologist (Medical): Patient refused    Lack of Transportation (Non-Medical): Patient refused  Physical Activity: Not on file  Stress: Not on file  Social Connections: Not on file  Intimate Partner Violence: Unknown (10/01/2022)   Humiliation, Afraid, Rape, and Kick questionnaire    Fear of Current or Ex-Partner: Not on  file    Emotionally Abused: Patient refused    Physically Abused: Patient refused    Sexually Abused: Patient refused    Family History:   Family History  Problem Relation Age of Onset   Heart disease Father    Heart attack Father    Heart attack Mother    Heart failure Sister 36   Stroke Neg Hx      ROS:  Please see the history of present illness.  All other ROS reviewed and negative.     Physical Exam/Data:   Vitals:   10/01/22 0703 10/01/22 0800 10/01/22  1000 10/01/22 1047  BP:  (!) 140/66 (!) 145/68 135/63  Pulse:  72 82 75  Resp:  16 17 (!) 21  Temp: 98.6 F (37 C)  97.8 F (36.6 C) (!) 97.5 F (36.4 C)  TempSrc: Oral  Oral Oral  SpO2:  97% 95% 98%  Weight:      Height:        Intake/Output Summary (Last 24 hours) at 10/01/2022 1529 Last data filed at 10/01/2022 6659 Gross per 24 hour  Intake 180 ml  Output --  Net 180 ml      09/30/2022    1:58 PM 09/19/2022    3:13 PM 08/14/2022    1:50 PM  Last 3 Weights  Weight (lbs) 100 lb 15.5 oz 101 lb 106 lb  Weight (kg) 45.8 kg 45.813 kg 48.081 kg     Body mass index is 17.33 kg/m.  General: Frail elderly female, in no acute distress on O2 HEENT: normal Neck: JVD 10-11 cm Vascular: No carotid bruits; Distal pulses 2+ bilaterally Cardiac:  normal S1, S2; RRR; 2/6 murmur  Lungs: Rales bases bilaterally, no wheezing, rhonchi  Abd: soft, nontender, no hepatomegaly  Ext: no edema Musculoskeletal:  No deformities, BUE and BLE strength weak but equal Skin: warm and dry  Neuro:  CNs 2-12 intact, no focal abnormalities noted Psych:  Normal affect   EKG:  The EKG was personally reviewed and demonstrates:  10/15 ECG is SR, HR 87, QRS 110 ms but feel underestimated secondary to IVCD Telemetry:  Telemetry was personally reviewed and demonstrates: Sinus rhythm  Relevant CV Studies:  ECHO: ordered  Echo 07/30/2019 IMPRESSIONS   1. The left ventricle has a visually estimated ejection fraction of 20%. The cavity  size was severely dilated. Left ventricular diastolic Doppler parameters are consistent with restrictive filling. Elevated left ventricular end-diastolic pressure Left ventrical global hypokinesis without regional wall motion abnormalities.  2. The right ventricle has normal systolic function. The cavity was normal. There is no increase in right ventricular wall thickness.  3. Left atrial size was moderately dilated.  4. Right atrial size was mildly dilated.  5. Moderate thickening of the mitral valve leaflet. Mild calcification of the mitral valve leaflet. There is mild mitral annular calcification present. Mitral valve regurgitation is moderate by color flow Doppler.  6. The aortic valve is tricuspid. Moderate thickening of the aortic valve. Sclerosis without any evidence of stenosis of the aortic valve. Aortic valve regurgitation is mild by color flow Doppler.  7. The aorta is normal unless otherwise noted.  Cath 01/10/2010   Laboratory Data:  High Sensitivity Troponin:   Recent Labs  Lab 09/30/22 1413 09/30/22 1731  TROPONINIHS 54* 69*     Chemistry Recent Labs  Lab 09/30/22 1413 10/01/22 1308  NA 125* 131*  K 5.1 4.9  CL 103 108  CO2 12* 14*  GLUCOSE 173* 121*  BUN 91* 92*  CREATININE 1.97* 1.86*  CALCIUM 9.0 9.2  MG 2.1  --   GFRNONAA 23* 25*  ANIONGAP 10 9    No results for input(s): "PROT", "ALBUMIN", "AST", "ALT", "ALKPHOS", "BILITOT" in the last 168 hours. Lipids No results for input(s): "CHOL", "TRIG", "HDL", "LABVLDL", "LDLCALC", "CHOLHDL" in the last 168 hours.  Hematology Recent Labs  Lab 09/30/22 1413  WBC 4.6  RBC 2.80*  HGB 9.0*  HCT 26.6*  MCV 95.0  MCH 32.1  MCHC 33.8  RDW 13.4  PLT 183   Thyroid No results for input(s): "TSH", "FREET4" in  the last 168 hours.  BNP Recent Labs  Lab 09/30/22 1413  BNP 2,752.9*    DDimer No results for input(s): "DDIMER" in the last 168 hours.   Radiology/Studies:  DG Chest 2 View  Result Date:  09/30/2022 CLINICAL DATA:  Chest pain EXAM: CHEST - 2 VIEW COMPARISON:  07/25/2022 FINDINGS: Transverse diameter of heart is increased. Central pulmonary vessels appear prominent. There are no signs of alveolar pulmonary edema. There is blunting of both lateral CP angles. There is no pneumothorax. Low position of diaphragms may suggest COPD. There is decrease in height of vertebral bodies at thoracolumbar junction. IMPRESSION: Cardiomegaly. Central pulmonary vessels appear prominent suggesting possible mild CHF. There is no focal pulmonary consolidation. There is blunting of both lateral CP angles suggesting small bilateral pleural effusions. Electronically Signed   By: Elmer Picker M.D.   On: 09/30/2022 14:33     Assessment and Plan:   Acute on chronic combined CHF-in the setting of longstanding nonischemic cardiomyopathy -The family is very aware that in order to get her breathing better, her kidney function may suffer -Her creatinine is not that much above previous values, but her BUN is significantly elevated above previous levels.  Until 08/23/2022, her BUN was 75 or less except for 05/22/2021 (she was hospitalized for C. difficile colitis) -Perhaps we need to look more closely into the amount of liquids she is consuming, it may not be enough to allow her kidneys to clear toxins -She is extremely frail and weak and her weight is down 3 kg from September of this year at 45 kg, so I suspect poor nutrition may be playing a role Concerned that she is starting to show signs of failure to thrive.  I think she probably needs to be on some diuretic, but not as much as she had been.  Making it 3 to 4 days a week is reasonable. -An echo is ordered, follow-up on the results -She got a dose of Lasix 20 mg IV and is now back on her home dose of Lasix 20 mg a day => based on the fact that she became volume overloaded being off of her diuretic and had worsening renal function being on daily diuretic, my  recommendation would be to make the furosemide 20 mg 3 days a week and additional doses as needed based on weight gain of more than 3 pounds in a day. -She is a DNR -Discuss what else we are able to offer her with MD -> not much to offer besides trying to titrate her cardiac medications. BP seems to be a bit elevated in the high 761Y 073X systolic.  This is despite being on carvedilol 6.25 mg twice daily.  Certainly would not want to use an ARB currently with the renal insufficiency. Perhaps going back to a standing regimen of Lasix with additional dosing PRN as noted above would be reasonable.  From a dyspnea standpoint, would strongly recommend evaluation for possible placement.  Overall, given her fragility and advanced age with now longstanding dilated cardiomyopathy, we may be starting to get to the end-stage heart failure.  Would be reasonable to consider palliative care evaluation to change goals of care from curative to palliation for symptom control.   Risk Assessment/Risk Scores:       New York Heart Association (NYHA) Functional Class NYHA Class III  CHA2DS2-VASc Score = 6   This indicates a 9.7% annual risk of stroke. The patient's score is based upon: CHF History: 1 HTN History: 1  Diabetes History: 0 Stroke History: 0 Vascular Disease History: 1 Age Score: 2 Gender Score: 1    For questions or updates, please contact [place] Please consult [email or web address] for contact info under    Signed, Rosaria Ferries, PA-C  10/01/2022 3:29 PM   ATTENDING ATTESTATION  I have seen, examined and evaluated the patient this PM along with Rosaria Ferries, PA.  After reviewing all the available data and chart, we discussed the patients laboratory, study & physical findings as well as symptoms in detail.  I agree with her findings, examination as well as impression recommendations as per our discussion.    Attending adjustments noted in italics.     Leonie Man,  MD, MS Glenetta Hew, M.D., M.S. Interventional Cardiologist  [place]  Pager # [phone] Phone # [phone] [address]. [place], [place] [address]

## 2022-10-01 NOTE — ED Notes (Signed)
Pt and family updated on POC, inpatient bed status, and transfer at this time no new update. Family member given a recliner to rest while awaiting update, Pt resting, warm blankets given, lights turned off for pt and family comfort, NAD noted, observed even RR and unlabored, side rails up x2 for safety, plan of care ongoing, pt expresses no needs or concerns at this time, call light within reach, no further concerns as of present.

## 2022-10-01 NOTE — ED Notes (Signed)
Provided pt with oatmeal and grape juice/water for breakfast. See flowsheet for intake record.

## 2022-10-01 NOTE — ED Notes (Signed)
Pt appears to be sleeping, observe even RR and unlabored, NAD noted, side rails up x2 for safety, plan of care ongoing, call light within reach, no further concerns as of present.  Family member in recliner next to stretcher

## 2022-10-01 NOTE — Progress Notes (Signed)
Pt arrived from ...[facility].., A/ox 4...pt denies any pain, MD aware,CCMD called. CHG bath given,no further needs at this time

## 2022-10-01 NOTE — ED Notes (Signed)
Pt up to BR via wheelchair and family member to assist pt, NAD noted, no concerns or complaints at this time

## 2022-10-01 NOTE — Plan of Care (Signed)
  Problem: Education: Goal: Knowledge of General Education information will improve Description: Including pain rating scale, medication(s)/side effects and non-pharmacologic comfort measures Outcome: Progressing   Problem: Clinical Measurements: Goal: Ability to maintain clinical measurements within normal limits will improve Outcome: Progressing Goal: Will remain free from infection Outcome: Progressing Goal: Diagnostic test results will improve Outcome: Progressing Goal: Respiratory complications will improve Outcome: Progressing Goal: Cardiovascular complication will be avoided Outcome: Progressing   Problem: Activity: Goal: Risk for activity intolerance will decrease Outcome: Progressing   Problem: Nutrition: Goal: Adequate nutrition will be maintained Outcome: Progressing   Problem: Coping: Goal: Level of anxiety will decrease Outcome: Progressing   Problem: Pain Managment: Goal: General experience of comfort will improve Outcome: Progressing   Problem: Safety: Goal: Ability to remain free from injury will improve Outcome: Progressing   

## 2022-10-01 NOTE — ED Notes (Signed)
Pt continues to sleep, observe even RR and unlabored, NAD noted, side rails up x2 for safety, plan of care ongoing, call light within reach, no further concerns as of present.

## 2022-10-01 NOTE — Plan of Care (Signed)
  Problem: Health Behavior/Discharge Planning: Goal: Ability to manage health-related needs will improve Outcome: Progressing   Problem: Clinical Measurements: Goal: Ability to maintain clinical measurements within normal limits will improve Outcome: Progressing Goal: Will remain free from infection Outcome: Progressing Goal: Diagnostic test results will improve Outcome: Progressing   

## 2022-10-02 ENCOUNTER — Observation Stay (HOSPITAL_BASED_OUTPATIENT_CLINIC_OR_DEPARTMENT_OTHER): Payer: Medicare Other

## 2022-10-02 DIAGNOSIS — I10 Essential (primary) hypertension: Secondary | ICD-10-CM

## 2022-10-02 DIAGNOSIS — E43 Unspecified severe protein-calorie malnutrition: Secondary | ICD-10-CM | POA: Insufficient documentation

## 2022-10-02 DIAGNOSIS — E875 Hyperkalemia: Secondary | ICD-10-CM | POA: Diagnosis not present

## 2022-10-02 DIAGNOSIS — E871 Hypo-osmolality and hyponatremia: Secondary | ICD-10-CM | POA: Diagnosis not present

## 2022-10-02 DIAGNOSIS — N184 Chronic kidney disease, stage 4 (severe): Secondary | ICD-10-CM | POA: Diagnosis not present

## 2022-10-02 DIAGNOSIS — G4733 Obstructive sleep apnea (adult) (pediatric): Secondary | ICD-10-CM | POA: Diagnosis not present

## 2022-10-02 DIAGNOSIS — Z7189 Other specified counseling: Secondary | ICD-10-CM

## 2022-10-02 DIAGNOSIS — Z515 Encounter for palliative care: Secondary | ICD-10-CM

## 2022-10-02 DIAGNOSIS — Z66 Do not resuscitate: Secondary | ICD-10-CM | POA: Diagnosis not present

## 2022-10-02 DIAGNOSIS — I5023 Acute on chronic systolic (congestive) heart failure: Secondary | ICD-10-CM | POA: Diagnosis not present

## 2022-10-02 DIAGNOSIS — I5043 Acute on chronic combined systolic (congestive) and diastolic (congestive) heart failure: Secondary | ICD-10-CM | POA: Diagnosis not present

## 2022-10-02 DIAGNOSIS — I5021 Acute systolic (congestive) heart failure: Secondary | ICD-10-CM | POA: Diagnosis not present

## 2022-10-02 LAB — ECHOCARDIOGRAM COMPLETE
AR max vel: 1.63 cm2
AV Area VTI: 1.93 cm2
AV Area mean vel: 1.55 cm2
AV Mean grad: 2 mmHg
AV Peak grad: 3.4 mmHg
Ao pk vel: 0.92 m/s
Area-P 1/2: 7.22 cm2
Height: 64 in
P 1/2 time: 328 msec
S' Lateral: 5.8 cm
Weight: 1580.8 oz

## 2022-10-02 LAB — BASIC METABOLIC PANEL
Anion gap: 9 (ref 5–15)
BUN: 98 mg/dL — ABNORMAL HIGH (ref 8–23)
CO2: 16 mmol/L — ABNORMAL LOW (ref 22–32)
Calcium: 8.9 mg/dL (ref 8.9–10.3)
Chloride: 107 mmol/L (ref 98–111)
Creatinine, Ser: 2.28 mg/dL — ABNORMAL HIGH (ref 0.44–1.00)
GFR, Estimated: 20 mL/min — ABNORMAL LOW (ref >60)
Glucose, Bld: 157 mg/dL — ABNORMAL HIGH (ref 70–99)
Potassium: 5.3 mmol/L — ABNORMAL HIGH (ref 3.5–5.1)
Sodium: 132 mmol/L — ABNORMAL LOW (ref 135–145)

## 2022-10-02 LAB — CBC
HCT: 23.9 % — ABNORMAL LOW (ref 36.0–46.0)
Hemoglobin: 8.5 g/dL — ABNORMAL LOW (ref 12.0–15.0)
MCH: 33.1 pg (ref 26.0–34.0)
MCHC: 35.6 g/dL (ref 30.0–36.0)
MCV: 93 fL (ref 80.0–100.0)
Platelets: 171 10*3/uL (ref 150–400)
RBC: 2.57 MIL/uL — ABNORMAL LOW (ref 3.87–5.11)
RDW: 13.4 % (ref 11.5–15.5)
WBC: 5.5 10*3/uL (ref 4.0–10.5)
nRBC: 0 % (ref 0.0–0.2)

## 2022-10-02 LAB — POTASSIUM
Potassium: 5.4 mmol/L — ABNORMAL HIGH (ref 3.5–5.1)
Potassium: 4.8 mmol/L (ref 3.5–5.1)

## 2022-10-02 MED ORDER — FUROSEMIDE 20 MG PO TABS: 20.0000 mg | ORAL_TABLET | ORAL | 0 refills | Status: DC

## 2022-10-02 MED ORDER — SODIUM ZIRCONIUM CYCLOSILICATE 10 G PO PACK
10.0000 g | PACK | Freq: Once | ORAL | Status: AC
  Administered 2022-10-02: 10 g via ORAL
  Filled 2022-10-02: qty 1

## 2022-10-02 MED ORDER — ACETAMINOPHEN ER 650 MG PO TBCR: 650.0000 mg | EXTENDED_RELEASE_TABLET | Freq: Three times a day (TID) | ORAL | Status: DC | PRN

## 2022-10-02 MED ORDER — ENSURE ENLIVE PO LIQD
237.0000 mL | Freq: Two times a day (BID) | ORAL | Status: DC
  Administered 2022-10-02: 237 mL via ORAL

## 2022-10-02 NOTE — Discharge Summary (Signed)
Physician Discharge Summary   Patient: Paige Cook MRN: [id number] DOB: February 08, 1928  Admit date:     09/30/2022  Discharge date: 10/02/22  Discharge Physician: Cordelia Poche, MD   PCP: Burnard Bunting, MD   Recommendations at discharge:  PCP and Cardiology follow-up Repeat BMP in 2 days Palliative care follow-up  Discharge Diagnoses: Principal Problem:   Acute on chronic systolic congestive heart failure ([place]) Active Problems:   Hypercholesteremia   OSA (obstructive sleep apnea)   CKD (chronic kidney disease) stage 4, GFR 15-29 ml/min (HCC)   HTN (hypertension)   Hyponatremia   Protein-calorie malnutrition, severe   Hyperkalemia  Resolved Problems:   * No resolved hospital problems. *  Hospital Course: CHRISTEL BAI is a 88 y.o. female with a history of systolic heart failure, CAD, remote colon cancer, chronic hyponatremia, hyperlipidemia, urothelial cell bladder cancer, and OSA on CPAP. Patient presented secondary to weakness and found to have evidence of acute heart failure and hyponatremia which improved with Lasix IV diuresis. Some worsening creatinine secondary to Lasix. Palliative care consulted and patient discharge home with palliative care and recommendation to follow-up with cardiology as an outpatient.  Assessment and Plan:  Acute on chronic systolic heart failure Difficult to control especially with underlying severe CKD. BNP of 2,752 and hyponatremia. Cardiology consulted. Patient managed with Lasix IV with good diuresis and result. Cardiology recommendation for Lasix 20 mg three times weekly in addition to PRN for symptoms. Patient referred to palliative care on discharge. Patient to follow-up with cardiology as an outpatient.  Hypervolemic hyponatremia Sodium of 125 on admission. Appears to be secondary to volume overload from heart failure. Sodium up to 132 prior to discharge after diuresis.  CKD stage IV Baseline creatinine of about 2. Slight increase  secondary to IV diuresis which is held. Creatinine of 2.28 on discharge. Repeat BMP as an outpatient.  Hyperkalemia Secondary to mildly worsened kidney function as mentioned above. Improved with Lokelma. Potassium of 4.8 on discharge. Repeat BMP in 2 days.  Primary hypertension Continue Coreg.  OSA Continue CPAP  Consultants: Cardiology Procedures performed: None  Disposition: Home health Diet recommendation: Cardiac diet   DISCHARGE MEDICATION: Allergies as of 10/02/2022       Reactions   Penicillins Hives   Has patient had a PCN reaction causing immediate rash, facial/tongue/throat swelling, SOB or lightheadedness with hypotension: Yes Has patient had a PCN reaction causing severe rash involving mucus membranes or skin necrosis: Yes Has patient had a PCN reaction that required hospitalization No Has patient had a PCN reaction occurring within the last 10 years: No If all of the above answers are "NO", then may proceed with Cephalosporin use.   Ultram [tramadol] Nausea Only, Other (See Comments)   Tremors         Medication List     STOP taking these medications    hydrALAZINE 10 MG tablet Commonly known as: APRESOLINE       TAKE these medications    acetaminophen 650 MG CR tablet Commonly known as: TYLENOL Take 1 tablet (650 mg total) by mouth every 8 (eight) hours as needed for pain. What changed: when to take this   ascorbic acid 500 MG tablet Commonly known as: VITAMIN C Take 500 mg by mouth daily.   carvedilol 6.25 MG tablet Commonly known as: COREG TAKE 1 TABLET BY MOUTH TWICE A DAY WITH MEALS What changed:  how much to take how to take this when to take this additional instructions  furosemide 20 MG tablet Commonly known as: Lasix Take 1 tablet (20 mg total) by mouth 3 (three) times a week. Take additional dose as needed for wgt gain > 3lb or symptoms of orthopnea (difficulty breathing while lying flat) Start taking on: October 03, 2022 What changed:  when to take this additional instructions   Vitamin D3 50 MCG (2000 UT) Tabs Take 50 mcg by mouth every other day.        Follow-up Information     [facility] Follow up.   Specialty: PALLIATIVE CARE Why: Outpt Palliative referral made ([facility])- they will follow up with you post discharge Contact information: [address] [place] [id number] [facility] up.   Why: ([facility]) HHRN/PT/OT arranged- they will contact you to schedule        Lenna Sciara, NP Follow up on 10/15/2022.   Specialties: Nurse Practitioner, Family Medicine Why: 10:05 am Contact information: [address] [place] [address] [phone]         Burnard Bunting, MD. Schedule an appointment as soon as possible for a visit in 1 week(s).   Specialty: Internal Medicine Why: For hospital follow-up Contact information: [place] [address] [phone]                Discharge Exam: BP 122/62 (BP Location: Right Arm)   Pulse 78   Temp 98 F (36.7 C) (Oral)   Resp 18   Ht '5\' 4"'$  (1.626 m)   Wt 44.8 kg   SpO2 98%   BMI 16.96 kg/m   General exam: Appears calm and comfortable. Frail appearing. Respiratory system: Diminished with mild rales at left base. Respiratory effort normal. Cardiovascular system: S1 & S2 heard, RRR. Gastrointestinal system: Abdomen is nondistended, soft and nontender. Normal bowel sounds heard. Central nervous system: Alert and oriented. No focal neurological deficits. Musculoskeletal: No calf tenderness  Condition at discharge: fair  The results of significant diagnostics from this hospitalization (including imaging, microbiology, ancillary and laboratory) are listed below for reference.   Imaging Studies: ECHOCARDIOGRAM COMPLETE  Result Date: 10/02/2022    ECHOCARDIOGRAM REPORT   Patient Name:   Paige Cook  Date of Exam: 10/02/2022 Medical Rec #:  [id number]      Height:       64.0 in Accession #:    [id number]     Weight:       98.8 lb Date of Birth:  Aug 11, 1928       BSA:          1.450 m Patient Age:    88 years       BP:           133/57 mmHg Patient Gender: F              HR:           73 bpm. Exam Location:  Inpatient Procedure: 2D Echo, Cardiac Doppler and Color Doppler Indications:    CHF-Acute Systolic 756.43 / P29.51  History:        Patient has prior history of Echocardiogram examinations, most                 recent 07/30/2019. Cardiomyopathy and CHF, CAD,                 Arrythmias:Tachycardia; Risk Factors:Sleep Apnea and  Hypertension.  Sonographer:    Ronny Flurry Sonographer#2:  Melissa Morford RDCS (AE, PE) Referring Phys: [place]  1. No LV thrombus noted. Left ventricular ejection fraction, by estimation, is 20 to 25%. Left ventricular ejection fraction by PLAX is 23 %. The left ventricle has severely decreased function. The left ventricle demonstrates global hypokinesis. The left ventricular internal cavity size was severely dilated. Left ventricular diastolic parameters are consistent with Grade I diastolic dysfunction (impaired relaxation). Elevated left ventricular end-diastolic pressure. The E/e' is 28.  2. Right ventricular systolic function is moderately reduced. The right ventricular size is normal. There is normal pulmonary artery systolic pressure. The estimated right ventricular systolic pressure is 03.5 mmHg.  3. Left atrial size was severely dilated.  4. The mitral valve is abnormal. Mild to moderate mitral valve regurgitation.  5. The tricuspid valve is abnormal. Tricuspid valve regurgitation is mild to moderate.  6. The aortic valve is tricuspid. Aortic valve regurgitation is mild to moderate. Aortic valve sclerosis/calcification is present, without any evidence of aortic stenosis. Aortic regurgitation PHT measures 328 msec.  7. The pulmonic  valve was abnormal.  8. The inferior vena cava is dilated in size with <50% respiratory variability, suggesting right atrial pressure of 15 mmHg. Comparison(s): Changes from prior study are noted. 07/30/2019: LVEF 20%, severe dilated LV. FINDINGS  Left Ventricle: No LV thrombus noted. Left ventricular ejection fraction, by estimation, is 20 to 25%. Left ventricular ejection fraction by PLAX is 23 %. The left ventricle has severely decreased function. The left ventricle demonstrates global hypokinesis. The left ventricular internal cavity size was severely dilated. There is no left ventricular hypertrophy. Left ventricular diastolic parameters are consistent with Grade I diastolic dysfunction (impaired relaxation). Elevated left ventricular end-diastolic pressure. The E/e' is 78. Right Ventricle: The right ventricular size is normal. No increase in right ventricular wall thickness. Right ventricular systolic function is moderately reduced. There is normal pulmonary artery systolic pressure. The tricuspid regurgitant velocity is 1.48 m/s, and with an assumed right atrial pressure of 15 mmHg, the estimated right ventricular systolic pressure is 59.7 mmHg. Left Atrium: Left atrial size was severely dilated. Right Atrium: Right atrial size was normal in size. Pericardium: There is no evidence of pericardial effusion. Mitral Valve: The mitral valve is abnormal. There is moderate thickening of the mitral valve leaflet(s). There is mild calcification of the mitral valve leaflet(s). Mild to moderate mitral valve regurgitation. Tricuspid Valve: The tricuspid valve is abnormal. Tricuspid valve regurgitation is mild to moderate. Aortic Valve: The aortic valve is tricuspid. Aortic valve regurgitation is mild to moderate. Aortic regurgitation PHT measures 328 msec. Aortic valve sclerosis/calcification is present, without any evidence of aortic stenosis. Aortic valve mean gradient measures 2.0 mmHg. Aortic valve peak gradient  measures 3.4 mmHg. Aortic valve area, by VTI measures 1.93 cm. Pulmonic Valve: The pulmonic valve was abnormal. Pulmonic valve regurgitation is mild. Aorta: The aortic root and ascending aorta are structurally normal, with no evidence of dilitation. Venous: The inferior vena cava is dilated in size with less than 50% respiratory variability, suggesting right atrial pressure of 15 mmHg. IAS/Shunts: No atrial level shunt detected by color flow Doppler.  LEFT VENTRICLE PLAX 2D LV EF:         Left            Diastology                ventricular     LV e' medial:    0.03 cm/s  ejection        LV E/e' medial:  31.8                fraction by     LV e' lateral:   0.03 cm/s                PLAX is 23      LV E/e' lateral: 29.4                %. LVIDd:         6.50 cm LVIDs:         5.80 cm LV PW:         1.10 cm LV IVS:        1.00 cm LVOT diam:     2.00 cm LV SV:         36 LV SV Index:   25 LVOT Area:     3.14 cm  RIGHT VENTRICLE            IVC RV S prime:     9.14 cm/s  IVC diam: 2.20 cm TAPSE (M-mode): 1.0 cm LEFT ATRIUM             Index        RIGHT ATRIUM           Index LA diam:        4.70 cm 3.24 cm/m   RA Area:     19.20 cm LA Vol (A2C):   66.1 ml 45.60 ml/m  RA Volume:   46.90 ml  32.35 ml/m LA Vol (A4C):   73.1 ml 50.42 ml/m LA Biplane Vol: 69.4 ml 47.87 ml/m  AORTIC VALVE AV Area (Vmax):    1.63 cm AV Area (Vmean):   1.55 cm AV Area (VTI):     1.93 cm AV Vmax:           91.70 cm/s AV Vmean:          68.900 cm/s AV VTI:            0.187 m AV Peak Grad:      3.4 mmHg AV Mean Grad:      2.0 mmHg LVOT Vmax:         47.60 cm/s LVOT Vmean:        34.000 cm/s LVOT VTI:          0.115 m LVOT/AV VTI ratio: 0.61 AI PHT:            328 msec  AORTA Ao Root diam: 3.00 cm Ao Asc diam:  2.90 cm MITRAL VALVE               TRICUSPID VALVE MV Area (PHT): 7.22 cm    TR Peak grad:   8.8 mmHg MV Decel Time: 105 msec    TR Vmax:        148.00 cm/s MV E velocity: 0.99 cm/s MV A velocity: 98.50 cm/s  SHUNTS  MV E/A ratio:  0.01        Systemic VTI:  0.12 m                            Systemic Diam: 2.00 cm Lyman Bishop MD Electronically signed by Lyman Bishop MD Signature Date/Time: 10/02/2022/12:41:49 PM    Final    DG Chest 2 View  Result Date: 09/30/2022 CLINICAL DATA:  Chest pain EXAM: CHEST - 2 VIEW COMPARISON:  07/25/2022 FINDINGS:  Transverse diameter of heart is increased. Central pulmonary vessels appear prominent. There are no signs of alveolar pulmonary edema. There is blunting of both lateral CP angles. There is no pneumothorax. Low position of diaphragms may suggest COPD. There is decrease in height of vertebral bodies at thoracolumbar junction. IMPRESSION: Cardiomegaly. Central pulmonary vessels appear prominent suggesting possible mild CHF. There is no focal pulmonary consolidation. There is blunting of both lateral CP angles suggesting small bilateral pleural effusions. Electronically Signed   By: Elmer Picker M.D.   On: 09/30/2022 14:33    Microbiology: Results for orders placed or performed during the hospital encounter of 05/22/21  Resp Panel by RT-PCR (Flu A&B, Covid) Nasopharyngeal Swab     Status: None   Collection Time: 05/22/21  3:35 PM   Specimen: Nasopharyngeal Swab; Nasopharyngeal(NP) swabs in vial transport medium  Result Value Ref Range Status   SARS Coronavirus 2 by RT PCR NEGATIVE NEGATIVE Final    Comment: (NOTE) SARS-CoV-2 target nucleic acids are NOT DETECTED.  The SARS-CoV-2 RNA is generally detectable in upper respiratory specimens during the acute phase of infection. The lowest concentration of SARS-CoV-2 viral copies this assay can detect is 138 copies/mL. A negative result does not preclude SARS-Cov-2 infection and should not be used as the sole basis for treatment or other patient management decisions. A negative result may occur with  improper specimen collection/handling, submission of specimen other than nasopharyngeal swab, presence of viral  mutation(s) within the areas targeted by this assay, and inadequate number of viral copies(<138 copies/mL). A negative result must be combined with clinical observations, patient history, and epidemiological information. The expected result is Negative.  Fact Sheet for Patients:  EntrepreneurPulse.com.au  Fact Sheet for Healthcare Providers:  IncredibleEmployment.be  This test is no t yet approved or cleared by the [place] FDA and  has been authorized for detection and/or diagnosis of SARS-CoV-2 by FDA under an Emergency Use Authorization (EUA). This EUA will remain  in effect (meaning this test can be used) for the duration of the COVID-19 declaration under Section 564(b)(1) of the Act, 21 U.S.C.section 360bbb-3(b)(1), unless the authorization is terminated  or revoked sooner.       Influenza A by PCR NEGATIVE NEGATIVE Final   Influenza B by PCR NEGATIVE NEGATIVE Final    Comment: (NOTE) The Xpert Xpress SARS-CoV-2/FLU/RSV plus assay is intended as an aid in the diagnosis of influenza from Nasopharyngeal swab specimens and should not be used as a sole basis for treatment. Nasal washings and aspirates are unacceptable for Xpert Xpress SARS-CoV-2/FLU/RSV testing.  Fact Sheet for Patients: EntrepreneurPulse.com.au  Fact Sheet for Healthcare Providers: IncredibleEmployment.be  This test is not yet approved or cleared by the [place] FDA and has been authorized for detection and/or diagnosis of SARS-CoV-2 by FDA under an Emergency Use Authorization (EUA). This EUA will remain in effect (meaning this test can be used) for the duration of the COVID-19 declaration under Section 564(b)(1) of the Act, 21 U.S.C. section 360bbb-3(b)(1), unless the authorization is terminated or revoked.  Performed at [facility], [address] [place], [place] [address]   Gastrointestinal Panel  by PCR , Stool     Status: None   Collection Time: 05/22/21  7:57 PM   Specimen: Stool  Result Value Ref Range Status   Campylobacter species NOT DETECTED NOT DETECTED Final   Plesimonas shigelloides NOT DETECTED NOT DETECTED Final   Salmonella species NOT DETECTED NOT DETECTED Final   Yersinia enterocolitica NOT DETECTED NOT  DETECTED Final   Vibrio species NOT DETECTED NOT DETECTED Final   Vibrio cholerae NOT DETECTED NOT DETECTED Final   Enteroaggregative E coli (EAEC) NOT DETECTED NOT DETECTED Final   Enteropathogenic E coli (EPEC) NOT DETECTED NOT DETECTED Final   Enterotoxigenic E coli (ETEC) NOT DETECTED NOT DETECTED Final   Shiga like toxin producing E coli (STEC) NOT DETECTED NOT DETECTED Final   Shigella/Enteroinvasive E coli (EIEC) NOT DETECTED NOT DETECTED Final   Cryptosporidium NOT DETECTED NOT DETECTED Final   Cyclospora cayetanensis NOT DETECTED NOT DETECTED Final   Entamoeba histolytica NOT DETECTED NOT DETECTED Final   Giardia lamblia NOT DETECTED NOT DETECTED Final   Adenovirus F40/41 NOT DETECTED NOT DETECTED Final   Astrovirus NOT DETECTED NOT DETECTED Final   Norovirus GI/GII NOT DETECTED NOT DETECTED Final   Rotavirus A NOT DETECTED NOT DETECTED Final   Sapovirus (I, II, IV, and V) NOT DETECTED NOT DETECTED Final    Comment: Performed at [facility], [address] [place], [place] [address]  C Difficile Quick Screen w PCR reflex     Status: Abnormal   Collection Time: 05/22/21  7:57 PM   Specimen: Stool  Result Value Ref Range Status   C Diff antigen POSITIVE (A) NEGATIVE Final   C Diff toxin NEGATIVE NEGATIVE Final   C Diff interpretation Results are indeterminate. See PCR results.  Final    Comment: Performed at [facility], [place] [address], [place], [place] [address]  C. Diff by PCR, Reflexed     Status: Abnormal   Collection Time: 05/22/21  7:57 PM  Result Value Ref Range Status   Toxigenic C. Difficile by PCR POSITIVE (A) NEGATIVE  Final    Comment: Positive for toxigenic C. difficile with little to no toxin production. Only treat if clinical presentation suggests symptomatic illness. Performed at [facility], [place] [address] [place], [place] [address]     Labs: CBC: Recent Labs  Lab 09/30/22 1413 10/02/22 0053  WBC 4.6 5.5  HGB 9.0* 8.5*  HCT 26.6* 23.9*  MCV 95.0 93.0  PLT 183 681   Basic Metabolic Panel: Recent Labs  Lab 09/30/22 1413 10/01/22 1308 10/02/22 0053 10/02/22 1149 10/02/22 1538  NA 125* 131* 132*  --   --   K 5.1 4.9 5.3* 5.4* 4.8  CL 103 108 107  --   --   CO2 12* 14* 16*  --   --   GLUCOSE 173* 121* 157*  --   --   BUN 91* 92* 98*  --   --   CREATININE 1.97* 1.86* 2.28*  --   --   CALCIUM 9.0 9.2 8.9  --   --   MG 2.1  --   --   --   --     Discharge time spent: 35 minutes.  Signed: Cordelia Poche, MD [facility] 10/02/2022

## 2022-10-02 NOTE — Progress Notes (Deleted)
Heart Failure Navigator Progress Note  Assessed for Heart & Vascular TOC clinic readiness.  Patient does not meet criteria due to confusion at baseline, shortness of breath multifactorial, deconditioned , non ambulatory..   Navigator available for reassessment of patient.   Paige Cook, BSN, Clinical cytogeneticist Only

## 2022-10-02 NOTE — Progress Notes (Signed)
Mobility Specialist Progress Note:   10/02/22 1242  Mobility  Activity Ambulated with assistance in hallway  Level of Assistance Standby assist, set-up cues, supervision of patient - no hands on  Assistive Device Front wheel walker  Distance Ambulated (ft) 300 ft  Activity Response Tolerated well  $Mobility charge 1 Mobility   Pt received in bed willing to participate in mobility. Complaints of back pain. Left in bed with call bell in reach and all needs met.   [facility] Surveyor, mining Chat only

## 2022-10-02 NOTE — Hospital Course (Addendum)
Paige Cook is a 88 y.o. female with a history of systolic heart failure, CKD stage IV, CAD, remote colon cancer, chronic hyponatremia, hyperlipidemia, urothelial cell bladder cancer, and OSA on CPAP. Patient presented secondary to weakness and found to have evidence of acute heart failure and hyponatremia which improved with Lasix IV diuresis. Some worsening creatinine secondary to Lasix. Palliative care consulted and patient discharge home with palliative care and recommendation to follow-up with cardiology as an outpatient.

## 2022-10-02 NOTE — Progress Notes (Signed)
Heart Failure Navigator Progress Note  Assessed for Heart & Vascular TOC clinic readiness.  Patient does not meet criteria due to patient will be going home with Palliative Care consult. GDMT limited due to CKD . Marland Kitchen   Navigator available for reassessment of patient.   Earnestine Leys, BSN, Clinical cytogeneticist Only

## 2022-10-02 NOTE — Telephone Encounter (Signed)
Spoke with pts daughter Anderson [place]. She is aware of Dr.Jordan's recommendations. Pt is currently in the hospital and they will follow the recommendations as directed per the hospital.

## 2022-10-02 NOTE — Consult Note (Signed)
Palliative Care Consult Note                                  Date: 10/02/2022   Patient Name: Paige Cook  DOB: [birth year]  MRN: [id number]  Age / Sex: 88 y.o., female  PCP: Burnard Bunting, MD Referring Physician: Mariel Aloe, MD  Reason for Consultation: Establishing goals of care  HPI/Patient Profile: 88 y.o. female  with past medical history of HFrEF in the setting of ischemic cardiomyopathy, CKD stage IIIb, CAD, remote colon cancer, chronic hyponatremia, and urothelial cell bladder cancer, OSA on CPAP, and HLD.  She presented to [facility] ED on 09/30/2022 with generalized weakness.  She also reported some chest pain and shortness of breath.  BNP was elevated and chest x-ray showed pulmonary edema. Patient admitted to [facility] with acute exacerbation of HFrEF. Palliative Medicine has been consulted for goals of care.   Subjective:   I have reviewed medical records including progress notes, labs and imaging, and assessed the patient at bedside. She has no acute complaints.  She walked 300 feet with the mobility specialist earlier today.  I spoke with daughters Paige Cook and Paige Cook by phone to discuss diagnosis, prognosis, GOC, EOL wishes, disposition, and options.  I introduced Palliative Medicine as specialized medical care for people living with serious illness. It focuses on providing relief from the symptoms and stress of a serious illness.   A brief life review was discussed.  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  Her husband passed away in 03-04-2019. She has 2 daughters - Paige Cook and Paige Cook.  She enjoys talking to people and she loves children.  She especially enjoys when her great grandchildren come to visit.  Regarding functional and nutritional status, daughters report their mother "does pretty well".  Paige Cook has continued to live in her own home.  Between her daughters and private  caregivers, she has 24/7 supervision for safety.  Paige Cook is ambulatory with a walker.  She is dependent with basic ADLs.  She does have issues with short-term memory.  We discussed patient's current medical situation and what it means in the larger context of her ongoing co-morbidities. Current clinical status was reviewed. Natural disease trajectory of advanced heart failure was discussed, emphasizing that it is a noncurable and progressive disease process.  Discussed the cycle of fluid overload and diuresis that becomes more frequent as heart failure progresses.  Discussed the delicate balance between fluid overload, diuretics, dehydration, and kidney function.   Created space and opportunity for family to explore thoughts and feelings regarding current medical situation. Values and goals of care important to patient and family were attempted to be elicited.  Daughters' main goal is to keep their mother at home and out of the hospital.  They wish to "treat the treatable" and are interested in a service that will draw labs and administer IV fluids at home.  However, they also speak to the importance of quality of life.  They express not wanting their mother to suffer and would not want her to be prolonged in a state where she had poor quality of life.  Discussion on the difference between outpatient palliative and Hospice care was had per family request. Palliative care and hospice have similar goals of managing symptoms and promoting quality of life. However, palliative care may be offered during any phase of a serious illness, while hospice  care is usually offered when a person is expected to live for 6 months or less. We discussed that hospice is offered in the setting of advanced/end-stage illness, and focuses on comfort and dignity rather than prolonging life.  Discussed that [facility] (outpatient palliative program through [place]) may be able to meet their needs and goals.   Offered to have their liaison contact them directly to answer additional questions - they were agreeable.  Questions and concerns addressed.  Plan for follow-up discussion tomorrow    Review of Systems  All other systems reviewed and are negative.   Objective:   Primary Diagnoses: Present on Admission:  Acute on chronic systolic CHF (congestive heart failure) (HCC)  OSA (obstructive sleep apnea)  Hypercholesteremia  HTN (hypertension)  CKD (chronic kidney disease) stage 4, GFR 15-29 ml/min (HCC)  Chronic hyponatremia   Physical Exam Vitals reviewed.  Constitutional:      General: She is not in acute distress.    Comments: Chronically ill-appearing  Pulmonary:     Effort: Pulmonary effort is normal.  Neurological:     Mental Status: She is alert and oriented to person, place, and time.  Psychiatric:        Cognition and Memory: Cognition is impaired. Memory is impaired.     Vital Signs:  BP 130/63 (BP Location: Right Arm)   Pulse 76   Temp 97.9 F (36.6 C) (Oral)   Resp 18   Ht '5\' 4"'$  (1.626 m)   Wt 44.8 kg   SpO2 98%   BMI 16.96 kg/m   Palliative Assessment/Data: PPS 50%     Assessment & Plan:   SUMMARY OF RECOMMENDATIONS   DNR/DNI as previously documented Continue all current interventions Goal of care is to treat the treatable while allowing patient to remain at home and avoid rehospitalization Family is considering outpatient palliative versus hospice PMT will follow-up tomorrow  Primary Decision Maker: Patient's 2 daughters  Prognosis:  < 6 months would not be surprising  Discharge Planning:  To Be Determined   Discussed with: Dr. Lonny Prude and [facility] team   Thank you for allowing [place] to participate in the care of Paige Cook  MDM - [place]  Signed by: Elie Confer, NP Palliative Medicine Team  Team Phone # [phone]  For individual providers, please see AMION

## 2022-10-02 NOTE — Progress Notes (Signed)
Patient and family given discharge instructions medication list and follow up appointments. IV was removed.all questions were answered. Will discharge home as ordered. Transported to exit via wheel chair and nursing staff. Malikai Gut, Bettina Gavia RN

## 2022-10-02 NOTE — TOC Transition Note (Signed)
Transition of Care (TOC) - CM/SW Discharge Note Marvetta Gibbons RN, BSN Transitions of Care Unit 4E- RN Case Manager See Treatment Team for direct phone #    Patient Details  Name: Paige Cook MRN: [id number] Date of Birth: September 11, 1928  Transition of Care [facility]) CM/SW Contact:  Dawayne Patricia, RN Phone Number: 10/02/2022, 4:41 PM   Clinical Narrative:    Per MD potential d/c today. CM has been notified by [facility] that they are active with pt for HHPT and can add any additional HH needs to services- will need new orders on discharge.   CM received msg from inhouse PC team that pt and daughter would like outpt PC- PC team has reached out to [facility] and they have been speaking with daughter.   CM spoke with daughter and pt at bedside- confirmed outpt PC choice for [facility], discussed home labs and home IVF that will need to be followed by PCP and HH. Daughter confirmed she has spoken with [facility] and is glad that they will also be able to follow along with [facility] and Primary care.  Discussed DME needs- for now daughter states no DME needs- they will follow up later with PCP regarding BSC.   Daughter to transport home- hopeful for d/c later this evening.   Call made to [facility] with [facility] for resumption of [place]- and adding OT and RN- confirmed that they can provide additional services and will f/u with PCP for Lab orders needed. MD to place orders for HHRN/PT/OT  Outpt palliative referral made to [place]- confirmed with [facility] Silversmith that they will f/u with daughter and patient post discharge.   No further TOC needs noted.    Final next level of care: [place] Barriers to Discharge: No Barriers Identified   Patient Goals and CMS Choice Patient states their goals for this hospitalization and ongoing recovery are:: return home CMS Medicare.gov Compare Post Acute Care list provided to::  Patient Choice offered to / list presented to : Patient, Adult Children  Discharge Placement               Home w/ [facility]        Discharge Plan and Services   Discharge Planning Services: CM Consult Post Acute Care Choice: Home Health, Resumption of Svcs/PTA Provider          DME Arranged: N/A (wants to wait on [facility] and will f/u with PCP) DME Agency: NA       HH Arranged: RN, PT, OT, Disease Management [place] Agency: [place] ([facility]) Date HH Agency Contacted: 10/02/22 Time [place]: [place] Representative spoke with at [place]: [place] ([place]) Interventions Housing Interventions: Other (Comment)   Readmission Risk Interventions     No data to display

## 2022-10-02 NOTE — Progress Notes (Signed)
Occupational Therapy Treatment Patient Details Name: Paige Cook MRN: [id number] DOB: 1928-01-12 Today's Date: 10/02/2022   History of present illness Pt is 88 yo female admitted on 09/30/22 with weakness, hyponatremia, and CHF.  Pt with hx of  HTN, HLD, NICM, CAD, OSA, PVCs, CKD stage 4, PAT, chronic CHF (EF 20%), urothelial cell carcinoma of the bladder, h/o SBO   OT comments  Pt progressing towards goals this session, able to complete ADLs and transfers using RW with min guard A, Pt mod I for bed mobility, needs min cues for safety to not take hands off RW when walking. SpO2 96% on RA after toilet transfer. Pt presenting with impairments listed below, will follow acutely. Continue to recommend HHOT at d/c   Recommendations for follow up therapy are one component of a multi-disciplinary discharge planning process, led by the attending physician.  Recommendations may be updated based on patient status, additional functional criteria and insurance authorization.    Follow Up Recommendations  Home health OT    Assistance Recommended at Discharge Intermittent Supervision/Assistance  Patient can return home with the following  A little help with walking and/or transfers;A little help with bathing/dressing/bathroom;Assistance with cooking/housework;Direct supervision/assist for medications management;Direct supervision/assist for financial management;Help with stairs or ramp for entrance;Assist for transportation   Equipment Recommendations  BSC/3in1    Recommendations for Other Services PT consult    Precautions / Restrictions Precautions Precautions: Fall Restrictions Weight Bearing Restrictions: No       Mobility Bed Mobility Overal bed mobility: Modified Independent             General bed mobility comments: incr time    Transfers Overall transfer level: Needs assistance Equipment used: Rolling walker (2 wheels) Transfers: Sit to/from Stand Sit to Stand: Min guard                  Balance Overall balance assessment: Needs assistance Sitting-balance support: No upper extremity supported Sitting balance-Leahy Scale: Good Sitting balance - Comments: able to don shoes and socks at EOB   Standing balance support: Bilateral upper extremity supported, Reliant on assistive device for balance Standing balance-Leahy Scale: Fair Standing balance comment: stands statically without LOB                           ADL either performed or assessed with clinical judgement   ADL Overall ADL's : Needs assistance/impaired     Grooming: Min guard;Standing;Wash/dry hands Grooming Details (indicate cue type and reason): washing hands         Upper Body Dressing : Min guard;Sitting Upper Body Dressing Details (indicate cue type and reason): donning hosuecoat Lower Body Dressing: Min guard;Sitting/lateral leans Lower Body Dressing Details (indicate cue type and reason): donning/doffing shoes Toilet Transfer: Min guard;Ambulation;Regular Museum/gallery exhibitions officer and Hygiene: Min guard       Functional mobility during ADLs: Min guard;Rolling walker (2 wheels)      Extremity/Trunk Assessment Upper Extremity Assessment Upper Extremity Assessment: Generalized weakness   Lower Extremity Assessment Lower Extremity Assessment: Defer to PT evaluation        Vision   Vision Assessment?: No apparent visual deficits   Perception Perception Perception: Not tested   Praxis Praxis Praxis: Not tested    Cognition Arousal/Alertness: Awake/alert Behavior During Therapy: WFL for tasks assessed/performed Overall Cognitive Status: Within Functional Limits for tasks assessed  Exercises      Shoulder Instructions       General Comments VSS On RA    Pertinent Vitals/ Pain       Pain Assessment Pain Assessment: No/denies pain  Home Living                                           Prior Functioning/Environment              Frequency  Min 2X/week        Progress Toward Goals  OT Goals(current goals can now be found in the care plan section)  Progress towards OT goals: Progressing toward goals  Acute Rehab OT Goals Patient Stated Goal: none stated OT Goal Formulation: With patient Time For Goal Achievement: 10/15/22 Potential to Achieve Goals: Good ADL Goals Pt Will Perform Upper Body Dressing: with modified independence;sitting Pt Will Perform Lower Body Dressing: with modified independence;sit to/from stand Pt Will Transfer to Toilet: with modified independence;ambulating;regular height toilet Pt Will Perform Tub/Shower Transfer: Tub transfer;Shower transfer;with modified independence;shower seat;ambulating  Plan Discharge plan remains appropriate;Frequency remains appropriate    Co-evaluation                 AM-PAC OT "6 Clicks" Daily Activity     Outcome Measure   Help from another person eating meals?: None Help from another person taking care of personal grooming?: A Little Help from another person toileting, which includes using toliet, bedpan, or urinal?: A Little Help from another person bathing (including washing, rinsing, drying)?: A Little Help from another person to put on and taking off regular upper body clothing?: A Little Help from another person to put on and taking off regular lower body clothing?: A Little 6 Click Score: 19    End of Session Equipment Utilized During Treatment: Rolling walker (2 wheels);Gait belt  OT Visit Diagnosis: Unsteadiness on feet (R26.81);Other abnormalities of gait and mobility (R26.89);Muscle weakness (generalized) (M62.81)   Activity Tolerance Patient tolerated treatment well   Patient Left in bed;with call bell/phone within reach;with family/visitor present;with bed alarm set   Nurse Communication Mobility status        Time: 8338-2505 OT Time  Calculation (min): 23 min  Charges: OT General Charges $OT Visit: 1 Visit OT Treatments $Self Care/Home Management : 8-22 mins $Therapeutic Activity: 8-22 mins  Lynnda Child, OTD, OTR/L Acute Rehab [phone] - [place] 10/02/2022, 3:01 PM

## 2022-10-02 NOTE — Progress Notes (Signed)
Pt & family refused use of our CPAP machine this evening. Pt does not regularly use '@home'$  & knows she does not tol ours. They are aware that they can call at any time should they feel a need.

## 2022-10-02 NOTE — Discharge Instructions (Signed)
Paige Cook,  You were in the hospital because of heart failure. This has improved with lasix. Your kidney function worsened a bit but will likely improve now that your Lasix regimen is adjusted. I have arranged for you to have repeat labs at home in two days. Please follow-up with your cardiologist.

## 2022-10-02 NOTE — Progress Notes (Addendum)
Rounding Note    Patient Name: Paige Cook Date of Encounter: 10/02/2022  [place] Cardiologist: Peter Martinique, MD   Subjective   Echo at bedside. Long discussion with daughter and patient. They strongly desire to go home.   Unfortunately, potassium went up to 5.4 today.  Was rechecked this afternoon, back down below 5.  Inpatient Medications    Scheduled Meds:  carvedilol  6.25 mg Oral BID   docusate sodium  100 mg Oral BID   enoxaparin (LOVENOX) injection  30 mg Subcutaneous Q24H   sodium chloride flush  3 mL Intravenous Q12H   Continuous Infusions:  PRN Meds: acetaminophen **OR** acetaminophen, bisacodyl, hydrALAZINE, ondansetron **OR** ondansetron (ZOFRAN) IV, polyethylene glycol, traZODone   Vital Signs    Vitals:   10/01/22 2130 10/01/22 2313 10/02/22 0515 10/02/22 0924  BP: (!) 136/56 (!) 124/52 (!) 133/57 130/63  Pulse: 71 76 72 76  Resp: '18 18 18 18  '$ Temp: 98 F (36.7 C) 98.4 F (36.9 C) 98 F (36.7 C) 97.9 F (36.6 C)  TempSrc: Oral Oral Oral Oral  SpO2: 98% 97% 97% 98%  Weight:   44.8 kg   Height:       No intake or output data in the 24 hours ending 10/02/22 1107    10/02/2022    5:15 AM 09/30/2022    1:58 PM 09/19/2022    3:13 PM  Last 3 Weights  Weight (lbs) 98 lb 12.8 oz 100 lb 15.5 oz 101 lb  Weight (kg) 44.815 kg 45.8 kg 45.813 kg      Telemetry   Not on telemetry  ECG    No new tracing  Physical Exam   GEN: Thin, frail older female Neck: No JVD Cardiac: RRR, 2/6 systolic murmur, no rubs, or gallops.  Respiratory: Clear to auscultation bilaterally. GI: Soft, nontender, non-distended  MS: No edema; No deformity. Neuro:  Nonfocal  Psych: Normal affect   Labs    High Sensitivity Troponin:   Recent Labs  Lab 09/30/22 1413 09/30/22 1731  TROPONINIHS 54* 69*     Chemistry Recent Labs  Lab 09/30/22 1413 10/01/22 1308 10/02/22 0053  NA 125* 131* 132*  K 5.1 4.9 5.3*  CL 103 108 107  CO2 12* 14* 16*   GLUCOSE 173* 121* 157*  BUN 91* 92* 98*  CREATININE 1.97* 1.86* 2.28*  CALCIUM 9.0 9.2 8.9  MG 2.1  --   --   GFRNONAA 23* 25* 20*  ANIONGAP '10 9 9    '$ Lipids No results for input(s): "CHOL", "TRIG", "HDL", "LABVLDL", "LDLCALC", "CHOLHDL" in the last 168 hours.  Hematology Recent Labs  Lab 09/30/22 1413 10/02/22 0053  WBC 4.6 5.5  RBC 2.80* 2.57*  HGB 9.0* 8.5*  HCT 26.6* 23.9*  MCV 95.0 93.0  MCH 32.1 33.1  MCHC 33.8 35.6  RDW 13.4 13.4  PLT 183 171   Thyroid No results for input(s): "TSH", "FREET4" in the last 168 hours.  BNP Recent Labs  Lab 09/30/22 1413  BNP 2,752.9*    DDimer No results for input(s): "DDIMER" in the last 168 hours.   Radiology    DG Chest 2 View  Result Date: 09/30/2022 CLINICAL DATA:  Chest pain EXAM: CHEST - 2 VIEW COMPARISON:  07/25/2022 FINDINGS: Transverse diameter of heart is increased. Central pulmonary vessels appear prominent. There are no signs of alveolar pulmonary edema. There is blunting of both lateral CP angles. There is no pneumothorax. Low position of diaphragms may suggest COPD.  There is decrease in height of vertebral bodies at thoracolumbar junction. IMPRESSION: Cardiomegaly. Central pulmonary vessels appear prominent suggesting possible mild CHF. There is no focal pulmonary consolidation. There is blunting of both lateral CP angles suggesting small bilateral pleural effusions. Electronically Signed   By: Elmer Picker M.D.   On: 09/30/2022 14:33    Cardiac Studies   Echo 10/02/2022: EF 20-25%. Global HK. Gr 1 DD. Normal PAP. Severe LA Dilation. Moderately reduced RV Fxn.  AOV calcification with no stenosis.  RAP estimated at 15 mmHg.  Echo: 07/2019  IMPRESSIONS     1. The left ventricle has a visually estimated ejection fraction of 20%.  The cavity size was severely dilated. Left ventricular diastolic Doppler  parameters are consistent with restrictive filling. Elevated left  ventricular end-diastolic pressure Left   ventrical global hypokinesis without regional wall motion abnormalities.   2. The right ventricle has normal systolic function. The cavity was  normal. There is no increase in right ventricular wall thickness.   3. Left atrial size was moderately dilated.   4. Right atrial size was mildly dilated.   5. Moderate thickening of the mitral valve leaflet. Mild calcification of  the mitral valve leaflet. There is mild mitral annular calcification  present. Mitral valve regurgitation is moderate by color flow Doppler.   6. The aortic valve is tricuspid. Moderate thickening of the aortic  valve. Sclerosis without any evidence of stenosis of the aortic valve.  Aortic valve regurgitation is mild by color flow Doppler.   7. The aorta is normal unless otherwise noted.   FINDINGS   Left Ventricle: The left ventricle has a visually estimated ejection  fraction of 20. The cavity size was severely dilated. There is no increase  in left ventricular wall thickness. Left ventricular diastolic Doppler  parameters are consistent with  restrictive filling. Elevated left ventricular end-diastolic pressure Left  ventrical global hypokinesis without regional wall motion abnormalities.   Right Ventricle: The right ventricle has normal systolic function. The  cavity was normal. There is no increase in right ventricular wall  thickness.   Left Atrium: Left atrial size was moderately dilated.   Right Atrium: Right atrial size was mildly dilated. Right atrial pressure  is estimated at 3 mmHg.   Interatrial Septum: No atrial level shunt detected by color flow Doppler.   Pericardium: There is no evidence of pericardial effusion.   Mitral Valve: The mitral valve is normal in structure. Moderate thickening  of the mitral valve leaflet. Mild calcification of the mitral valve  leaflet. There is mild mitral annular calcification present. Mitral valve  regurgitation is moderate by color  flow Doppler.    Tricuspid Valve: The tricuspid valve is normal in structure. Tricuspid  valve regurgitation is mild by color flow Doppler.   Aortic Valve: The aortic valve is tricuspid Moderate thickening of the  aortic valve. Sclerosis without any evidence of stenosis of the aortic  valve. Aortic valve regurgitation is mild by color flow Doppler.   Pulmonic Valve: The pulmonic valve was grossly normal. Pulmonic valve  regurgitation is mild by color flow Doppler.   Echo: pending  Patient Profile     88 y.o. female  hx of nonischemic cardiomyopathy, PACs, CAD, hyperlipidemia, and OSA on CPAP. Previous cardiac catheterization on 01/10/2010 showed EF 30%, 40 to 50% proximal LAD lesion, 30% mid LAD lesion, otherwise normal left circumflex, and left main and RCA. EF of 40% in 2012, echocardiogram in 2013 showed EF of 45%. 2020 EF  was 20%. Hx very frequent PACs on the Holter monitor however no atrial fibrillation was identified. She was seen 10/01/2022 for evaluation of CHF at the request of Dr. Lorin Mercy.   Assessment & Plan    HFrEF NICM -- known hx of NICM with LVEF 20% on echo 07/2019, repeat today confirms EF 20 to 25% but this time the RAP is estimated at 15 million mercury.  This would go along with concerns for cardiorenal syndrome with renal venous hypertension -- GDMT has been very limited with CKD -> renal function seems to be pretty labile based on volume status. -- continue coreg 6.'25mg'$  BID, no ACE/ARB/ARNi with renal disease --> I think having a standing dose of diuretic 2 to 3 days a week and as needed for worsening orthopnea as well as weight gain more than 3 pounds.  Otherwise would try to avoid every day dosing.  Also needs to ensure that she is adequately hydrating at least 50 to 60 ounces of water a day.  Hyponatremia -- improving -- Na+ 125>>131>>132  CKD stage IV -- was set up to see nephrology as an outpatient -- baseline appears 1.8-2.2 -> I think we just simply need to understand that  she will be labile between 1.8-2.5 with congestive nephropathy/cardiorenal.  When her renal function continues to get progressively worse at that point we would need to really consider moving toward hospice care as opposed to palliative care but for now I think palliative care is reasonable.  Hyperkalemia -- K+ 5.3 this morning -- repeat K+ pending  CAD -- non-obstructive disease on cath 2011 -- no anginal symptoms, though not a candidate for invasive therapy if symptoms were to arise  HTN -- stable with current regimen -- continue coreg 6.'25mg'$  BID  Long discussion with daughter over the phone. They would like to be set up for palliative care services in the outpatient setting. Strong desire to take the patient home. Discussed options for daily lasix, along with PRN dosing, scaling back coreg if needed.  I also spent time talking with the daughter here today in the in the hospital.  They are fully on board with going home.  Now that the potassium level is improved on follow-up, I think is stable for her to go home.  She will need close follow-up and intermittent labs.  Hopefully with palliative care team this can be checked at home.  Hopefully the plan will be to avoid further hospitalizations and allow for plans to treat in the outpatient setting.    For questions or updates, please contact [place] Please consult [email or web address] for contact info under        Signed, Reino Bellis, NP  10/02/2022, 11:07 AM    ATTENDING ATTESTATION  I have seen, examined and evaluated the patient this PM along with Reino Bellis, NP-C.  After reviewing all the available data and chart, we discussed the patients laboratory, study & physical findings as well as symptoms in detail.  I agree with her findings, examination as well as impression recommendations as per our discussion.    Attending adjustments noted in italics.  Pretty much my notes are as above.  Okay for discharge home.   Just needs to have close follow-up.  This has been arranged-Emily Larina Earthly, NP on 10/30At 10:05 AM.  [facility] office.  Plan is for her to be discharged today.  We have arranged to follow-up.  Medication recommendations as above.    Leonie Man, MD, MS Glenetta Hew, M.D., M.S.  Interventional Cardiologist  [place]  Pager # [phone] Phone # [phone] [address] [place], [place] [address]

## 2022-10-02 NOTE — Progress Notes (Signed)
Initial Nutrition Assessment  DOCUMENTATION CODES:   Severe malnutrition in context of chronic illness, Underweight  INTERVENTION:  Liberalize diet to regular to remove restrictions and encouraged PO intake.  Provide Ensure Enlive/Ensure Plus High Protein po BID, each supplement provides 350 kcal and 20 grams of protein.  NUTRITION DIAGNOSIS:   Severe Malnutrition related to chronic illness (CHF, CKD) as evidenced by severe fat depletion, severe muscle depletion.  GOAL:   Patient will meet greater than or equal to 90% of their needs  MONITOR:   PO intake, Supplement acceptance, Labs, Weight trends, I & O's  REASON FOR ASSESSMENT:   Consult  (Nutrition goals)  ASSESSMENT:   88 year old female with PMHx of chronic systolic CHF EF 31%, HTN, CAD, remote colon cancer, chronic hyponatremia, HLD, urothelial cell bladder cancer, OSA on CPAP admitted with acute on chronic systolic CHF, hyponatremia, CKD stage IV.  Met with pt and CNA sitter from home at bedside. Pt eating lunch at time of RD assessment. She reports her appetite is good and unchanged from baseline. She reports typically eating 3 meals per day. For breakfast she has oatmeal with toast. For lunch she has a grilled sandwich with yogurt. For dinner she has meat with rice and vegetables. Unsure of portion sizes pt eats at home. She drinks juice and water at home. Denies any difficulty chewing or swallowing. Denies nausea, emesis, or abdominal pain. Pt taking bites of lunch at time of RD assessment. Home CNA sitter with concern for amount of food pt will be able to eat during hospitalization. Pt would benefit from diet liberalization to regular diet. Also encouraged intake of Ensure Enlive/Ensure Plus and pt is amenable to trying these supplements to help meet calorie/protein needs. Calorie and protein needs are elevated in setting of chronic illness.  Pt initially denied loss of wt and reports UBW was 120 lbs. She then later  reported recent UBW was between 98-103 lbs. Suspect wt loss occurred slowly over time. Wts in chart variable between 42-50 kg over the past year and 4 months. Suspect variability related to fluid status vs use of different scales.  UOP: none documented since admission  I/O: +180 mL since admission  Medications reviewed and include: carvedilol, Colace, enoxaparin  Labs reviewed: Sodium 132, Potassium 5.3, CO2 16, Glucose 157, BUN 98, Creatinine 2.28  Per chart review family interested in setting up palliative care services in the outpatient services. Strong desire to take patient home.  NUTRITION - FOCUSED PHYSICAL EXAM:  Flowsheet Row Most Recent Value  Orbital Region Moderate depletion  Upper Arm Region Severe depletion  Thoracic and Lumbar Region Severe depletion  Buccal Region Severe depletion  Temple Region Moderate depletion  Clavicle Bone Region Severe depletion  Clavicle and Acromion Bone Region Severe depletion  Scapular Bone Region Moderate depletion  Dorsal Hand Severe depletion  Patellar Region Severe depletion  Anterior Thigh Region Severe depletion  Posterior Calf Region Severe depletion  Edema (RD Assessment) None  Hair Reviewed  Eyes Reviewed  Mouth Reviewed  Skin Reviewed  Nails Reviewed       Diet Order:   Diet Order             Diet regular Room service appropriate? Yes; Fluid consistency: Thin  Diet effective now                   EDUCATION NEEDS:   No education needs have been identified at this time  Skin:  Skin Assessment: Reviewed RN Assessment  Last BM:  10/01/22  Height:   Ht Readings from Last 1 Encounters:  09/30/22 $RemoveB'5\' 4"'[id number]$  (1.626 m)    Weight:   Wt Readings from Last 1 Encounters:  10/02/22 44.8 kg   Ideal Body Weight:  54.5 kg  BMI:  Body mass index is 16.96 kg/m.  Estimated Nutritional Needs:   Kcal:  1500-1700  Protein:  75-85 grams  Fluid:  1.5-1.7 L/day or per team  Loanne Drilling, MS, RD, LDN,  [place] Pager number available on Amion

## 2022-10-03 NOTE — Progress Notes (Signed)
   This pt was originally referred for hospice care based on her decline functionally and related to worsening heart failure However, the two daughter in discussion were not in agreement with the full comfort care approach. They wanted there mother to continue to get routine lab draws and IVF to fix electrolyte imbalance that are caused from lasix use etc. She was referred to our Palliative care program for [place] to assist in management of the sx needs related to her CHF. Home Health services will be able to be involved with our services to assist with lab draws and other needs. We utilize her PCP for orders for the palliative care program.   Thank you for this referral. Webb Silversmith RN

## 2022-10-04 DIAGNOSIS — I130 Hypertensive heart and chronic kidney disease with heart failure and stage 1 through stage 4 chronic kidney disease, or unspecified chronic kidney disease: Secondary | ICD-10-CM | POA: Diagnosis not present

## 2022-10-04 DIAGNOSIS — E43 Unspecified severe protein-calorie malnutrition: Secondary | ICD-10-CM | POA: Diagnosis not present

## 2022-10-04 DIAGNOSIS — E871 Hypo-osmolality and hyponatremia: Secondary | ICD-10-CM | POA: Diagnosis not present

## 2022-10-04 DIAGNOSIS — I5023 Acute on chronic systolic (congestive) heart failure: Secondary | ICD-10-CM | POA: Diagnosis not present

## 2022-10-04 DIAGNOSIS — G4733 Obstructive sleep apnea (adult) (pediatric): Secondary | ICD-10-CM | POA: Diagnosis not present

## 2022-10-04 DIAGNOSIS — E7800 Pure hypercholesterolemia, unspecified: Secondary | ICD-10-CM | POA: Diagnosis not present

## 2022-10-04 DIAGNOSIS — D631 Anemia in chronic kidney disease: Secondary | ICD-10-CM | POA: Diagnosis not present

## 2022-10-04 DIAGNOSIS — I2510 Atherosclerotic heart disease of native coronary artery without angina pectoris: Secondary | ICD-10-CM | POA: Diagnosis not present

## 2022-10-04 DIAGNOSIS — M1990 Unspecified osteoarthritis, unspecified site: Secondary | ICD-10-CM | POA: Diagnosis not present

## 2022-10-04 DIAGNOSIS — Z9181 History of falling: Secondary | ICD-10-CM | POA: Diagnosis not present

## 2022-10-04 DIAGNOSIS — I255 Ischemic cardiomyopathy: Secondary | ICD-10-CM | POA: Diagnosis not present

## 2022-10-04 DIAGNOSIS — K5790 Diverticulosis of intestine, part unspecified, without perforation or abscess without bleeding: Secondary | ICD-10-CM | POA: Diagnosis not present

## 2022-10-04 DIAGNOSIS — N184 Chronic kidney disease, stage 4 (severe): Secondary | ICD-10-CM | POA: Diagnosis not present

## 2022-10-05 DIAGNOSIS — D631 Anemia in chronic kidney disease: Secondary | ICD-10-CM | POA: Diagnosis not present

## 2022-10-05 DIAGNOSIS — I2510 Atherosclerotic heart disease of native coronary artery without angina pectoris: Secondary | ICD-10-CM | POA: Diagnosis not present

## 2022-10-05 DIAGNOSIS — N184 Chronic kidney disease, stage 4 (severe): Secondary | ICD-10-CM | POA: Diagnosis not present

## 2022-10-05 DIAGNOSIS — I255 Ischemic cardiomyopathy: Secondary | ICD-10-CM | POA: Diagnosis not present

## 2022-10-05 DIAGNOSIS — I5023 Acute on chronic systolic (congestive) heart failure: Secondary | ICD-10-CM | POA: Diagnosis not present

## 2022-10-05 DIAGNOSIS — I130 Hypertensive heart and chronic kidney disease with heart failure and stage 1 through stage 4 chronic kidney disease, or unspecified chronic kidney disease: Secondary | ICD-10-CM | POA: Diagnosis not present

## 2022-10-06 ENCOUNTER — Other Ambulatory Visit: Payer: Self-pay | Admitting: Cardiology

## 2022-10-09 DIAGNOSIS — E871 Hypo-osmolality and hyponatremia: Secondary | ICD-10-CM | POA: Diagnosis not present

## 2022-10-09 DIAGNOSIS — N184 Chronic kidney disease, stage 4 (severe): Secondary | ICD-10-CM | POA: Diagnosis not present

## 2022-10-09 DIAGNOSIS — I129 Hypertensive chronic kidney disease with stage 1 through stage 4 chronic kidney disease, or unspecified chronic kidney disease: Secondary | ICD-10-CM | POA: Diagnosis not present

## 2022-10-09 DIAGNOSIS — N390 Urinary tract infection, site not specified: Secondary | ICD-10-CM | POA: Diagnosis not present

## 2022-10-09 DIAGNOSIS — I509 Heart failure, unspecified: Secondary | ICD-10-CM | POA: Diagnosis not present

## 2022-10-10 DIAGNOSIS — E611 Iron deficiency: Secondary | ICD-10-CM | POA: Diagnosis not present

## 2022-10-10 DIAGNOSIS — I5021 Acute systolic (congestive) heart failure: Secondary | ICD-10-CM | POA: Diagnosis not present

## 2022-10-10 DIAGNOSIS — E875 Hyperkalemia: Secondary | ICD-10-CM | POA: Diagnosis not present

## 2022-10-10 DIAGNOSIS — R531 Weakness: Secondary | ICD-10-CM | POA: Diagnosis not present

## 2022-10-10 DIAGNOSIS — N184 Chronic kidney disease, stage 4 (severe): Secondary | ICD-10-CM | POA: Diagnosis not present

## 2022-10-10 DIAGNOSIS — N179 Acute kidney failure, unspecified: Secondary | ICD-10-CM | POA: Diagnosis not present

## 2022-10-10 DIAGNOSIS — I130 Hypertensive heart and chronic kidney disease with heart failure and stage 1 through stage 4 chronic kidney disease, or unspecified chronic kidney disease: Secondary | ICD-10-CM | POA: Diagnosis not present

## 2022-10-10 DIAGNOSIS — J90 Pleural effusion, not elsewhere classified: Secondary | ICD-10-CM | POA: Diagnosis not present

## 2022-10-10 DIAGNOSIS — D638 Anemia in other chronic diseases classified elsewhere: Secondary | ICD-10-CM | POA: Diagnosis not present

## 2022-10-10 DIAGNOSIS — E871 Hypo-osmolality and hyponatremia: Secondary | ICD-10-CM | POA: Diagnosis not present

## 2022-10-10 DIAGNOSIS — I509 Heart failure, unspecified: Secondary | ICD-10-CM | POA: Diagnosis not present

## 2022-10-11 DIAGNOSIS — I2510 Atherosclerotic heart disease of native coronary artery without angina pectoris: Secondary | ICD-10-CM | POA: Diagnosis not present

## 2022-10-11 DIAGNOSIS — I130 Hypertensive heart and chronic kidney disease with heart failure and stage 1 through stage 4 chronic kidney disease, or unspecified chronic kidney disease: Secondary | ICD-10-CM | POA: Diagnosis not present

## 2022-10-11 DIAGNOSIS — N184 Chronic kidney disease, stage 4 (severe): Secondary | ICD-10-CM | POA: Diagnosis not present

## 2022-10-11 DIAGNOSIS — I255 Ischemic cardiomyopathy: Secondary | ICD-10-CM | POA: Diagnosis not present

## 2022-10-11 DIAGNOSIS — D631 Anemia in chronic kidney disease: Secondary | ICD-10-CM | POA: Diagnosis not present

## 2022-10-11 DIAGNOSIS — I5023 Acute on chronic systolic (congestive) heart failure: Secondary | ICD-10-CM | POA: Diagnosis not present

## 2022-10-12 DIAGNOSIS — I130 Hypertensive heart and chronic kidney disease with heart failure and stage 1 through stage 4 chronic kidney disease, or unspecified chronic kidney disease: Secondary | ICD-10-CM | POA: Diagnosis not present

## 2022-10-12 DIAGNOSIS — I2510 Atherosclerotic heart disease of native coronary artery without angina pectoris: Secondary | ICD-10-CM | POA: Diagnosis not present

## 2022-10-12 DIAGNOSIS — D631 Anemia in chronic kidney disease: Secondary | ICD-10-CM | POA: Diagnosis not present

## 2022-10-12 DIAGNOSIS — N184 Chronic kidney disease, stage 4 (severe): Secondary | ICD-10-CM | POA: Diagnosis not present

## 2022-10-12 DIAGNOSIS — I5023 Acute on chronic systolic (congestive) heart failure: Secondary | ICD-10-CM | POA: Diagnosis not present

## 2022-10-12 DIAGNOSIS — I255 Ischemic cardiomyopathy: Secondary | ICD-10-CM | POA: Diagnosis not present

## 2022-10-15 ENCOUNTER — Encounter: Payer: Self-pay | Admitting: Nurse Practitioner

## 2022-10-15 ENCOUNTER — Ambulatory Visit: Payer: Medicare Other | Attending: Nurse Practitioner | Admitting: Nurse Practitioner

## 2022-10-15 VITALS — BP 120/60 | HR 75 | Ht 60.0 in | Wt 99.2 lb

## 2022-10-15 DIAGNOSIS — E875 Hyperkalemia: Secondary | ICD-10-CM | POA: Diagnosis not present

## 2022-10-15 DIAGNOSIS — N183 Chronic kidney disease, stage 3 unspecified: Secondary | ICD-10-CM | POA: Diagnosis not present

## 2022-10-15 DIAGNOSIS — I428 Other cardiomyopathies: Secondary | ICD-10-CM

## 2022-10-15 DIAGNOSIS — E871 Hypo-osmolality and hyponatremia: Secondary | ICD-10-CM | POA: Diagnosis not present

## 2022-10-15 DIAGNOSIS — I2510 Atherosclerotic heart disease of native coronary artery without angina pectoris: Secondary | ICD-10-CM

## 2022-10-15 DIAGNOSIS — N184 Chronic kidney disease, stage 4 (severe): Secondary | ICD-10-CM

## 2022-10-15 DIAGNOSIS — D631 Anemia in chronic kidney disease: Secondary | ICD-10-CM | POA: Insufficient documentation

## 2022-10-15 DIAGNOSIS — I5022 Chronic systolic (congestive) heart failure: Secondary | ICD-10-CM | POA: Diagnosis not present

## 2022-10-15 DIAGNOSIS — G4733 Obstructive sleep apnea (adult) (pediatric): Secondary | ICD-10-CM | POA: Diagnosis not present

## 2022-10-15 DIAGNOSIS — E785 Hyperlipidemia, unspecified: Secondary | ICD-10-CM | POA: Diagnosis not present

## 2022-10-15 NOTE — Progress Notes (Signed)
Cardiology Clinic Note   Patient Name: Paige Cook Date of Encounter: 10/15/2022  Primary Care Provider:  Burnard Bunting, MD Primary Cardiologist:  Peter Martinique, MD  Patient Profile    88 year old female with a history of chronic systolic heart failure/NICM, nonobstructive CAD, PACs, hyperlipidemia, CKD stage IIIb, and OSA who presents for follow-up related to heart failure.   Past Medical History    Past Medical History:  Diagnosis Date   AKI (acute kidney injury) ([facility]) 05/22/2021   Allergic rhinitis    Anemia    Arthritis    Bladder cancer ([facility])    giant tumor   CAD (coronary artery disease)    Cardiac arrhythmia    benign   Carotid bruit    left   CHF (congestive heart failure) ([facility])    CHF with systolic dysfuntion ; dilated nonishemic cardiomyopathy   Colon cancer ([facility]) 1989   Colon polyps    Diverticulosis    Epistaxis    Female bladder prolapse    Gallstones    Hypercholesteremia    Hyponatremia    Probably secondary to her CHF   OSA (obstructive sleep apnea) 04/29/2012   Past Surgical History:  Procedure Laterality Date   ABDOMINAL HYSTERECTOMY     BLADDER SURGERY  2013   cancer removed   bladder tact     CARDIAC CATHETERIZATION     non obstruction CAD   CHOLECYSTECTOMY     COLECTOMY     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    Ultram [Tramadol] Nausea Only and Other (See Comments)    Tremors     History of Present Illness     88 year old female with the above past medical history including chronic systolic heart failure/NICM, nonobstructive CAD, PACs, hyperlipidemia, CKD stage IIIb, and OSA.   Prior cardiac catheterization in 12/2009 showed EF 30%, 40- 50% pLAD lesion, 30% mid LAD lesion, otherwise normal left circumflex, left main, and RCA.  EF in 2012 was 40%.  Subsequent echocardiogram in 2013 showed EF of 45%.  Prior Holter monitor revealed frequent PVCs, no evidence of atrial fibrillation. Echocardiogram in 07/2019 revealed EF 20%, severely dilated LV cavity, elevated LVEDP, left ventricular global hypokinesis without regional wall motion abnormalities, normal RV systolic function, moderately dilated left atrium, mildly dilated right atrium, moderate thickening of mitral valve leaflet, mild MAC, moderate mitral valve rotation, aortic valve sclerosis without any evidence of aortic stenosis.  Titration of heart failure medications has been limited due to CKD and hypotension.  She was hospitalized in August 2023 with generalized weakness, hyponatremia.  She was hydrated with normal saline, Lasix was held. At her follow-up visit in 07/2022 she noted increased shortness of breath, mild ankle edema.  Chest x-ray post hospital discharge showed increased pulmonary edema, some effusions. Sodium and fluid restriction was recommended (1.2 L / 36 ounces daily).  Lasix was resumed at 20 mg daily.  She was later referred to nephrology in the setting of worsening renal function and  ongoing hyponatremia. She was last seen in the office on 09/19/2022 .  Lasix was changed to as needed use when stable fluid volume status, worsening renal function and hyponatremia.  She resented to the ED on 09/30/2022 with weakness, fatigue, intermittent shortness of breath in the setting of acute on chronic systolic heart failure.  Cardiology was consulted.  Repeat echocardiogram showed EF 20 to 25%, LV global hypokinesis, G1 DD, elevated LVEDP, moderately reduced RV  systolic function, mild to moderate mitral valve regurgitation, mild to moderate tricuspid valve regurgitation, mild to moderate aortic valve regurgitation.  She received IV Lasix.  Oral Lasix dosing was transitioned to 20 mg 2 to 3 days a week and as needed for worsening orthopnea, weight gain more than 3 pounds overnight.  Adequate hydration was encouraged.  Was noted to have hyperkalemia during her hospitalization, however, this improved.  Creatinine was 2.28 on discharge, potassium was 4.8.  With worsening renal function, concern for cardiorenal syndrome, palliative care was consulted.  She was discharged home in stable condition on 10/02/2022.   She presents today for follow-up accompanied by her daughter.  Since her last visit she has been stable from a cardiac standpoint.  She saw nephrology who recommended she can take Lasix up to 3 times a day as needed for weight gain, orthopnea.  She has been doing this and has noticed an improvement in her symptoms though she does note occasional orthopnea.  Her daughter questions whether or not some of this could be related to anxiety.  She does note some ongoing generalized fatigue, however, she is working on remaining active as tolerated.  Otherwise, she denies any additional concerns today.  Home Medications    Current Outpatient Medications  Medication Sig Dispense Refill   acetaminophen (TYLENOL) 650 MG CR tablet Take 1 tablet (650 mg total) by mouth every 8 (eight) hours as needed for pain.     ascorbic acid (VITAMIN C) 500 MG tablet Take 500 mg by mouth daily.     carvedilol (COREG) 6.25 MG tablet TAKE 1 TABLET BY MOUTH TWICE A DAY WITH MEALS (Patient taking differently: Take 6.25 mg by mouth 2 (two) times daily.) 30 tablet 2   Cholecalciferol (VITAMIN D3) 50 MCG (2000 UT) TABS Take 50 mcg by mouth every other day.     furosemide (LASIX) 20 MG tablet Take 1 tablet (20 mg total) by mouth 3 (three) times a week. Take additional dose as needed for wgt  gain > 3lb or symptoms of orthopnea (difficulty breathing while lying flat) 30 tablet 0   No current facility-administered medications for this visit.     Review of Systems   She denies chest pain, palpitations, dyspnea, pnd, n, v, dizziness, syncope, edema, weight gain, or early satiety. All other systems reviewed and are otherwise negative except as noted above.   Physical Exam    VS:  BP 120/60   Pulse 75   Ht 5' (1.524 m)   Wt 99 lb 3.2 oz (45 kg)   BMI 19.37 kg/m  GEN: Well nourished, well developed, in no acute distress. HEENT: normal. Neck: Supple, no JVD, carotid bruits, or masses. Cardiac: RRR, no murmurs, rubs, or gallops. No clubbing, cyanosis, edema.  Radials/DP/PT 2+ and equal bilaterally.  Respiratory:  Respirations regular and unlabored, clear to auscultation bilaterally. GI: Soft, nontender, nondistended, BS + x 4. MS: no deformity or atrophy. Skin: warm and dry, no rash. Neuro:  Strength and sensation are intact. Psych: Normal affect.  Accessory Clinical  Findings    ECG personally reviewed by me today- No EKG in office today. Lab Results  Component Value Date   WBC 5.5 10/02/2022   HGB 8.5 (L) 10/02/2022   HCT 23.9 (L) 10/02/2022   MCV 93.0 10/02/2022   PLT 171 10/02/2022   Lab Results  Component Value Date   CREATININE 2.28 (H) 10/02/2022   BUN 98 (H) 10/02/2022   NA 132 (L) 10/02/2022   K 4.8 10/02/2022   CL 107 10/02/2022   CO2 16 (L) 10/02/2022   Lab Results  Component Value Date   ALT 9 05/22/2021   AST 17 05/22/2021   ALKPHOS 62 05/22/2021   BILITOT 0.3 05/22/2021   Lab Results  Component Value Date   CHOL 164 01/28/2020   HDL 60 01/28/2020   LDLCALC 91 01/28/2020   LDLDIRECT 152.9 03/06/2013   TRIG 64 01/28/2020   CHOLHDL 2.7 01/28/2020    No results found for: "HGBA1C"  Assessment & Plan   1. Chronic systolic heart failure/NICM: Recent hospitalization for same.  Echo showed EF 20 to 25%, LV global hypokinesis, G1 DD,  elevated LVEDP, moderately reduced RV systolic function, mild to moderate mitral valve regurgitation, mild to moderate tricuspid valve regurgitation, mild to moderate aortic valve regurgitation.  Difficult to manage fluid volume status in the setting of CKD stage IV, hyponatremia.  Per nephrology, she may take Lasix 20 mg up to 3 times daily as needed for weight gain, swelling, orthopnea.  She does note some intermittent orthopnea, overall euvolemic and well compensated on exam today.  She is following with palliative care as well.  Will check BMET, BNP today.  Continue carvedilol, Lasix.   2. Hyponatremia/hyperkalemia: Sodium was stable at 132 on 10/02/2022.  Potassium was 5.3.  Repeat BMET pending.  Following with nephrology.  3. CAD: Cath in 12/2009 showed EF 30%, 40 to 50% pLAD lesion, 30% mid LAD lesion, otherwise normal left circumflex, left main, and RCA.  Stable with no anginal symptoms. No indication for ischemic evaluation.  Continue carvedilol.    4. Hyperlipidemia: LDL was 91 in 06/2022.  Monitored and managed per PCP.  Not on statin.   5. CKD stage IIIb-IV: Creatinine was 2.28 on 10/02/2022.  Will repeat BMET today. Following with nephrology.    6. Anemia: S/p recent transfusion.  Hemoglobin was stable at 8.5 on 10/02/2022.   7. OSA: Reports intermittent CPAP use.   8. Disposition: Follow-up as scheduled in 11/2022.   Lenna Sciara, NP 10/15/2022, 12:39 PM

## 2022-10-15 NOTE — Patient Instructions (Signed)
Medication Instructions:  Your physician recommends that you continue on your current medications as directed. Please refer to the Current Medication list given to you today.   *If you need a refill on your cardiac medications before your next appointment, please call your pharmacy*   Lab Work: Your physician recommends that you complete labs today BNP & BMET  If you have labs (blood work) drawn today and your tests are completely normal, you will receive your results only by: MyChart Message (if you have MyChart) OR A paper copy in the mail If you have any lab test that is abnormal or we need to change your treatment, we will call you to review the results.   Testing/Procedures: NONE ordered at this time of appointment     Follow-Up: At [facility], you and your health needs are our priority.  As part of our continuing mission to provide you with exceptional heart care, we have created designated Provider Care Teams.  These Care Teams include your primary Cardiologist (physician) and Advanced Practice Providers (APPs -  Physician Assistants and Nurse Practitioners) who all work together to provide you with the care you need, when you need it.  We recommend signing up for the patient portal called "MyChart".  Sign up information is provided on this After Visit Summary.  MyChart is used to connect with patients for Virtual Visits (Telemedicine).  Patients are able to view lab/test results, encounter notes, upcoming appointments, etc.  Non-urgent messages can be sent to your provider as well.   To learn more about what you can do with MyChart, go to [email or web address].    Your next appointment:    Keep follow up  The format for your next appointment:   In Person  Provider:   Diona Browner, NP        Other Instructions   Important Information About Sugar

## 2022-10-16 ENCOUNTER — Telehealth: Payer: Self-pay

## 2022-10-16 LAB — BASIC METABOLIC PANEL
BUN/Creatinine Ratio: 52 — ABNORMAL HIGH (ref 12–28)
BUN: 108 mg/dL (ref 10–36)
CO2: 16 mmol/L — ABNORMAL LOW (ref 20–29)
Calcium: 8.8 mg/dL (ref 8.7–10.3)
Chloride: 92 mmol/L — ABNORMAL LOW (ref 96–106)
Creatinine, Ser: 2.06 mg/dL — ABNORMAL HIGH (ref 0.57–1.00)
Glucose: 150 mg/dL — ABNORMAL HIGH (ref 70–99)
Potassium: 4.3 mmol/L (ref 3.5–5.2)
Sodium: 126 mmol/L — ABNORMAL LOW (ref 134–144)
eGFR: 22 mL/min/{1.73_m2} — ABNORMAL LOW (ref >59)

## 2022-10-16 LAB — BRAIN NATRIURETIC PEPTIDE: BNP: 1344.6 pg/mL — ABNORMAL HIGH (ref 0.0–100.0)

## 2022-10-16 NOTE — Telephone Encounter (Signed)
Left a detailed message on pts daughters machine [employer] [place]). Pt  advised to call back with any questions or concerns.

## 2022-10-19 DIAGNOSIS — N184 Chronic kidney disease, stage 4 (severe): Secondary | ICD-10-CM | POA: Diagnosis not present

## 2022-10-19 DIAGNOSIS — D631 Anemia in chronic kidney disease: Secondary | ICD-10-CM | POA: Diagnosis not present

## 2022-10-19 DIAGNOSIS — I2510 Atherosclerotic heart disease of native coronary artery without angina pectoris: Secondary | ICD-10-CM | POA: Diagnosis not present

## 2022-10-19 DIAGNOSIS — I255 Ischemic cardiomyopathy: Secondary | ICD-10-CM | POA: Diagnosis not present

## 2022-10-19 DIAGNOSIS — I5023 Acute on chronic systolic (congestive) heart failure: Secondary | ICD-10-CM | POA: Diagnosis not present

## 2022-10-19 DIAGNOSIS — I130 Hypertensive heart and chronic kidney disease with heart failure and stage 1 through stage 4 chronic kidney disease, or unspecified chronic kidney disease: Secondary | ICD-10-CM | POA: Diagnosis not present

## 2022-10-25 DIAGNOSIS — D631 Anemia in chronic kidney disease: Secondary | ICD-10-CM | POA: Diagnosis not present

## 2022-10-25 DIAGNOSIS — I2510 Atherosclerotic heart disease of native coronary artery without angina pectoris: Secondary | ICD-10-CM | POA: Diagnosis not present

## 2022-10-25 DIAGNOSIS — I5023 Acute on chronic systolic (congestive) heart failure: Secondary | ICD-10-CM | POA: Diagnosis not present

## 2022-10-25 DIAGNOSIS — I130 Hypertensive heart and chronic kidney disease with heart failure and stage 1 through stage 4 chronic kidney disease, or unspecified chronic kidney disease: Secondary | ICD-10-CM | POA: Diagnosis not present

## 2022-10-25 DIAGNOSIS — N184 Chronic kidney disease, stage 4 (severe): Secondary | ICD-10-CM | POA: Diagnosis not present

## 2022-10-25 DIAGNOSIS — I255 Ischemic cardiomyopathy: Secondary | ICD-10-CM | POA: Diagnosis not present

## 2022-10-26 DIAGNOSIS — I255 Ischemic cardiomyopathy: Secondary | ICD-10-CM | POA: Diagnosis not present

## 2022-10-26 DIAGNOSIS — I130 Hypertensive heart and chronic kidney disease with heart failure and stage 1 through stage 4 chronic kidney disease, or unspecified chronic kidney disease: Secondary | ICD-10-CM | POA: Diagnosis not present

## 2022-10-26 DIAGNOSIS — N184 Chronic kidney disease, stage 4 (severe): Secondary | ICD-10-CM | POA: Diagnosis not present

## 2022-10-26 DIAGNOSIS — I5023 Acute on chronic systolic (congestive) heart failure: Secondary | ICD-10-CM | POA: Diagnosis not present

## 2022-10-26 DIAGNOSIS — D631 Anemia in chronic kidney disease: Secondary | ICD-10-CM | POA: Diagnosis not present

## 2022-10-26 DIAGNOSIS — I2510 Atherosclerotic heart disease of native coronary artery without angina pectoris: Secondary | ICD-10-CM | POA: Diagnosis not present

## 2022-10-31 DIAGNOSIS — E611 Iron deficiency: Secondary | ICD-10-CM | POA: Diagnosis not present

## 2022-10-31 DIAGNOSIS — I509 Heart failure, unspecified: Secondary | ICD-10-CM | POA: Diagnosis not present

## 2022-10-31 DIAGNOSIS — E875 Hyperkalemia: Secondary | ICD-10-CM | POA: Diagnosis not present

## 2022-10-31 DIAGNOSIS — N184 Chronic kidney disease, stage 4 (severe): Secondary | ICD-10-CM | POA: Diagnosis not present

## 2022-10-31 DIAGNOSIS — N179 Acute kidney failure, unspecified: Secondary | ICD-10-CM | POA: Diagnosis not present

## 2022-10-31 DIAGNOSIS — I5021 Acute systolic (congestive) heart failure: Secondary | ICD-10-CM | POA: Diagnosis not present

## 2022-10-31 DIAGNOSIS — I130 Hypertensive heart and chronic kidney disease with heart failure and stage 1 through stage 4 chronic kidney disease, or unspecified chronic kidney disease: Secondary | ICD-10-CM | POA: Diagnosis not present

## 2022-10-31 DIAGNOSIS — E871 Hypo-osmolality and hyponatremia: Secondary | ICD-10-CM | POA: Diagnosis not present

## 2022-10-31 DIAGNOSIS — J90 Pleural effusion, not elsewhere classified: Secondary | ICD-10-CM | POA: Diagnosis not present

## 2022-10-31 DIAGNOSIS — R531 Weakness: Secondary | ICD-10-CM | POA: Diagnosis not present

## 2022-10-31 DIAGNOSIS — D638 Anemia in other chronic diseases classified elsewhere: Secondary | ICD-10-CM | POA: Diagnosis not present

## 2022-11-02 DIAGNOSIS — I2510 Atherosclerotic heart disease of native coronary artery without angina pectoris: Secondary | ICD-10-CM | POA: Diagnosis not present

## 2022-11-02 DIAGNOSIS — N184 Chronic kidney disease, stage 4 (severe): Secondary | ICD-10-CM | POA: Diagnosis not present

## 2022-11-02 DIAGNOSIS — I255 Ischemic cardiomyopathy: Secondary | ICD-10-CM | POA: Diagnosis not present

## 2022-11-02 DIAGNOSIS — I5023 Acute on chronic systolic (congestive) heart failure: Secondary | ICD-10-CM | POA: Diagnosis not present

## 2022-11-02 DIAGNOSIS — D631 Anemia in chronic kidney disease: Secondary | ICD-10-CM | POA: Diagnosis not present

## 2022-11-02 DIAGNOSIS — I130 Hypertensive heart and chronic kidney disease with heart failure and stage 1 through stage 4 chronic kidney disease, or unspecified chronic kidney disease: Secondary | ICD-10-CM | POA: Diagnosis not present

## 2022-11-15 DIAGNOSIS — E43 Unspecified severe protein-calorie malnutrition: Secondary | ICD-10-CM | POA: Diagnosis not present

## 2022-11-15 DIAGNOSIS — I2510 Atherosclerotic heart disease of native coronary artery without angina pectoris: Secondary | ICD-10-CM | POA: Diagnosis not present

## 2022-11-15 DIAGNOSIS — I5022 Chronic systolic (congestive) heart failure: Secondary | ICD-10-CM | POA: Diagnosis not present

## 2022-11-19 ENCOUNTER — Ambulatory Visit: Payer: Medicare Other | Admitting: Nurse Practitioner

## 2022-11-28 DIAGNOSIS — E871 Hypo-osmolality and hyponatremia: Secondary | ICD-10-CM | POA: Diagnosis not present

## 2022-11-28 DIAGNOSIS — E611 Iron deficiency: Secondary | ICD-10-CM | POA: Diagnosis not present

## 2022-11-28 DIAGNOSIS — I130 Hypertensive heart and chronic kidney disease with heart failure and stage 1 through stage 4 chronic kidney disease, or unspecified chronic kidney disease: Secondary | ICD-10-CM | POA: Diagnosis not present

## 2022-11-28 DIAGNOSIS — R531 Weakness: Secondary | ICD-10-CM | POA: Diagnosis not present

## 2022-11-28 DIAGNOSIS — D638 Anemia in other chronic diseases classified elsewhere: Secondary | ICD-10-CM | POA: Diagnosis not present

## 2022-11-28 DIAGNOSIS — I509 Heart failure, unspecified: Secondary | ICD-10-CM | POA: Diagnosis not present

## 2022-11-28 DIAGNOSIS — N179 Acute kidney failure, unspecified: Secondary | ICD-10-CM | POA: Diagnosis not present

## 2022-11-28 DIAGNOSIS — I5021 Acute systolic (congestive) heart failure: Secondary | ICD-10-CM | POA: Diagnosis not present

## 2022-11-28 DIAGNOSIS — N184 Chronic kidney disease, stage 4 (severe): Secondary | ICD-10-CM | POA: Diagnosis not present

## 2022-12-05 DIAGNOSIS — N390 Urinary tract infection, site not specified: Secondary | ICD-10-CM | POA: Diagnosis not present

## 2022-12-05 DIAGNOSIS — I129 Hypertensive chronic kidney disease with stage 1 through stage 4 chronic kidney disease, or unspecified chronic kidney disease: Secondary | ICD-10-CM | POA: Diagnosis not present

## 2022-12-05 DIAGNOSIS — I509 Heart failure, unspecified: Secondary | ICD-10-CM | POA: Diagnosis not present

## 2022-12-05 DIAGNOSIS — D631 Anemia in chronic kidney disease: Secondary | ICD-10-CM | POA: Diagnosis not present

## 2022-12-05 DIAGNOSIS — E871 Hypo-osmolality and hyponatremia: Secondary | ICD-10-CM | POA: Diagnosis not present

## 2022-12-05 DIAGNOSIS — N184 Chronic kidney disease, stage 4 (severe): Secondary | ICD-10-CM | POA: Diagnosis not present

## 2022-12-12 ENCOUNTER — Other Ambulatory Visit: Payer: Self-pay | Admitting: Cardiology

## 2022-12-15 DIAGNOSIS — I2510 Atherosclerotic heart disease of native coronary artery without angina pectoris: Secondary | ICD-10-CM | POA: Diagnosis not present

## 2022-12-15 DIAGNOSIS — I5022 Chronic systolic (congestive) heart failure: Secondary | ICD-10-CM | POA: Diagnosis not present

## 2022-12-15 DIAGNOSIS — E43 Unspecified severe protein-calorie malnutrition: Secondary | ICD-10-CM | POA: Diagnosis not present

## 2022-12-20 DIAGNOSIS — N184 Chronic kidney disease, stage 4 (severe): Secondary | ICD-10-CM | POA: Diagnosis not present

## 2022-12-31 ENCOUNTER — Other Ambulatory Visit: Payer: Self-pay

## 2022-12-31 ENCOUNTER — Ambulatory Visit: Payer: Medicare Other | Attending: Nurse Practitioner | Admitting: Nurse Practitioner

## 2022-12-31 ENCOUNTER — Encounter: Payer: Self-pay | Admitting: Nurse Practitioner

## 2022-12-31 VITALS — BP 118/50 | HR 78 | Ht 60.0 in | Wt 97.0 lb

## 2022-12-31 DIAGNOSIS — G4733 Obstructive sleep apnea (adult) (pediatric): Secondary | ICD-10-CM | POA: Insufficient documentation

## 2022-12-31 DIAGNOSIS — I5022 Chronic systolic (congestive) heart failure: Secondary | ICD-10-CM

## 2022-12-31 DIAGNOSIS — E871 Hypo-osmolality and hyponatremia: Secondary | ICD-10-CM

## 2022-12-31 DIAGNOSIS — I2510 Atherosclerotic heart disease of native coronary artery without angina pectoris: Secondary | ICD-10-CM | POA: Diagnosis not present

## 2022-12-31 DIAGNOSIS — E875 Hyperkalemia: Secondary | ICD-10-CM | POA: Diagnosis not present

## 2022-12-31 DIAGNOSIS — E785 Hyperlipidemia, unspecified: Secondary | ICD-10-CM | POA: Diagnosis not present

## 2022-12-31 DIAGNOSIS — N184 Chronic kidney disease, stage 4 (severe): Secondary | ICD-10-CM

## 2022-12-31 DIAGNOSIS — I428 Other cardiomyopathies: Secondary | ICD-10-CM | POA: Diagnosis not present

## 2022-12-31 NOTE — Patient Instructions (Signed)
Medication Instructions:  Your physician recommends that you continue on your current medications as directed. Please refer to the Current Medication list given to you today.   *If you need a refill on your cardiac medications before your next appointment, please call your pharmacy*   Lab Work: Your physician recommends that you return for lab work in 3 weeks CBC, BEMT  If you have labs (blood work) drawn today and your tests are completely normal, you will receive your results only by: [place] (if you have MyChart) OR A paper copy in the mail If you have any lab test that is abnormal or we need to change your treatment, we will call you to review the results.   Testing/Procedures: NONE ordered at this time of appointment     Follow-Up: At [facility], you and your health needs are our priority.  As part of our continuing mission to provide you with exceptional heart care, we have created designated Provider Care Teams.  These Care Teams include your primary Cardiologist (physician) and Advanced Practice Providers (APPs -  Physician Assistants and Nurse Practitioners) who all work together to provide you with the care you need, when you need it.  We recommend signing up for the patient portal called "MyChart".  Sign up information is provided on this After Visit Summary.  MyChart is used to connect with patients for Virtual Visits (Telemedicine).  Patients are able to view lab/test results, encounter notes, upcoming appointments, etc.  Non-urgent messages can be sent to your provider as well.   To learn more about what you can do with MyChart, go to [email or web address].    Your next appointment:   4-6 month(s)  Provider:   Peter Martinique, MD     Other Instructions

## 2022-12-31 NOTE — Progress Notes (Signed)
Office Visit    Patient Name: Paige Cook Date of Encounter: 12/31/2022  Primary Care Provider:  Burnard Bunting, MD Primary Cardiologist:  Peter Martinique, MD  Chief Complaint    89 year old female with a history of chronic systolic heart failure/NICM, nonobstructive CAD, PACs, hyperlipidemia, CKD stage IIIb, and OSA who presents for follow-up related to heart failure.    Past Medical History    Past Medical History:  Diagnosis Date   AKI (acute kidney injury) ([place]) 05/22/2021   Allergic rhinitis    Anemia    Arthritis    Bladder cancer ([place])    giant tumor   CAD (coronary artery disease)    Cardiac arrhythmia    benign   Carotid bruit    left   CHF (congestive heart failure) ([place])    CHF with systolic dysfuntion ; dilated nonishemic cardiomyopathy   Colon cancer ([place]) 1989   Colon polyps    Diverticulosis    Epistaxis    Female bladder prolapse    Gallstones    Hypercholesteremia    Hyponatremia    Probably secondary to her CHF   OSA (obstructive sleep apnea) 04/29/2012   Past Surgical History:  Procedure Laterality Date   ABDOMINAL HYSTERECTOMY     BLADDER SURGERY  2013   cancer removed   bladder tact     CARDIAC CATHETERIZATION     non obstruction CAD   CHOLECYSTECTOMY     COLECTOMY     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    Ultram [Tramadol] Nausea Only and Other (See Comments)    Tremors      Labs/Other Studies Reviewed    The  following studies were reviewed today: Echo 10/02/2022: IMPRESSIONS    1. No LV thrombus noted. Left ventricular ejection fraction, by  estimation, is 20 to 25%. Left ventricular ejection fraction by PLAX is 23  %. The left ventricle has severely decreased function. The left ventricle  demonstrates global hypokinesis. The  left ventricular internal cavity size was severely dilated. Left  ventricular diastolic parameters are consistent with Grade I diastolic  dysfunction (impaired relaxation). Elevated left ventricular end-diastolic  pressure. The E/e' is 83.   2. Right ventricular systolic function is moderately reduced. The right  ventricular size is normal. There is normal pulmonary artery systolic  pressure. The estimated right ventricular systolic pressure is 12.7 mmHg.   3. Left atrial size was severely dilated.   4. The mitral valve is abnormal. Mild to moderate mitral valve  regurgitation.   5. The tricuspid valve is abnormal. Tricuspid valve regurgitation is mild  to moderate.   6. The aortic valve is tricuspid. Aortic valve regurgitation is mild to  moderate. Aortic valve sclerosis/calcification is present, without any  evidence of aortic stenosis. Aortic regurgitation PHT measures 328 msec.   7. The pulmonic valve was abnormal.   8. The inferior vena cava is dilated in size with <50% respiratory  variability, suggesting right atrial pressure of 15  mmHg.   Comparison(s): Changes from prior study are noted. 07/30/2019: LVEF 20%,  severe dilated LV.   Recent Labs: 09/30/2022: Magnesium 2.1 10/02/2022: Hemoglobin 8.5; Platelets 171 10/15/2022: BNP 1,344.6; BUN 108; Creatinine, Ser 2.06; Potassium 4.3; Sodium 126  Recent Lipid Panel    Component Value Date/Time   CHOL 164 01/28/2020 0940   TRIG 64 01/28/2020 0940   HDL 60 01/28/2020 0940   CHOLHDL 2.7 01/28/2020 0940   CHOLHDL 2.2 11/03/2015 1042   VLDL 15 11/03/2015 1042   LDLCALC 91 01/28/2020 0940   LDLDIRECT 152.9  03/06/2013 0924    History of Present Illness    89 year old female with the above past medical history including chronic systolic heart failure/NICM, nonobstructive CAD, PACs, hyperlipidemia, CKD stage IIIb, and OSA.   Prior cardiac catheterization in 12/2009 showed EF 30%, 40- 50% pLAD lesion, 30% mid LAD lesion, otherwise normal left circumflex, left main, and RCA.  EF in 2012 was 40%.  Subsequent echocardiogram in 2013 showed EF of 45%. Prior Holter monitor revealed frequent PVCs, no evidence of atrial fibrillation. Echocardiogram in 07/2019 revealed EF 20%, severely dilated LV cavity, elevated LVEDP, left ventricular global hypokinesis without regional wall motion abnormalities, normal RV systolic function, moderately dilated left atrium, mildly dilated right atrium, moderate thickening of mitral valve leaflet, mild MAC, moderate mitral valve rotation, aortic valve sclerosis without any evidence of aortic stenosis.  Titration of heart failure medications has been limited due to CKD and hypotension.  She was hospitalized in August 2023 with generalized weakness, hyponatremia. Lasix was held. At her follow-up visit in 07/2022 she noted increased shortness of breath, mild ankle edema. Sodium and fluid restriction was recommended (1.2 L / 36 ounces daily).  Lasix was resumed at 20 mg daily.  She was later referred to nephrology in the setting of worsening renal function and ongoing hyponatremia. Lasix was later changed to as needed use when stable fluid volume status, worsening renal function and hyponatremia.  She resented to the ED on 09/30/2022 with weakness, fatigue, intermittent shortness of breath in the setting of acute on chronic systolic heart failure  Repeat echocardiogram showed EF 20 to 25%, LV global hypokinesis, G1 DD, elevated LVEDP, moderately reduced RV systolic function, mild to moderate mitral valve regurgitation, mild to moderate tricuspid valve regurgitation, mild to moderate aortic valve  regurgitation. She was diuresed and  pral Lasix dosing was transitioned to 20 mg 2 to 3 days a week and as needed for worsening orthopnea, weight gain more than 3 pounds overnight.  Adequate hydration was encouraged.Creatinine was 2.28 on discharge, potassium was 4.8.  With worsening renal function, concern for cardiorenal syndrome, palliative care was consulted.  She was last seen in the office on 10/15/2022 and was stable from a cardiac standpoint.    She presents today for follow-up accompanied by her daughter.  Since her last visit she has been stable from a cardiac standpoint.  She saw her nephrologist in December 2023 and was told that she may increase her Lasix to 20 mg daily.  She states she was also told that she may take an additional Lasix 20 to 40 mg daily as needed for shortness of breath, swelling, weight gain.  She denies any chest pain, dyspnea, edema, PND, orthopnea, weight gain.  Overall, she reports feeling well.   Home Medications    Current Outpatient Medications  Medication Sig Dispense Refill   acetaminophen (TYLENOL) 650 MG CR tablet Take 1 tablet (650 mg total) by mouth every 8 (eight)  hours as needed for pain.     ascorbic acid (VITAMIN C) 500 MG tablet Take 500 mg by mouth daily.     carvedilol (COREG) 6.25 MG tablet TAKE 1 TABLET BY MOUTH TWICE A DAY WITH FOOD (Patient taking differently: Take 6.25 mg by mouth 2 (two) times daily with a meal. TAKE ONE WHOLE TABLET (6.25) IN THE MORNING AND HALF TABLET (3.125) AT NIGHT.) 180 tablet 1   Cholecalciferol (VITAMIN D3) 50 MCG (2000 UT) TABS Take 50 mcg by mouth every other day.     furosemide (LASIX) 20 MG tablet Take 1 tablet (20 mg total) by mouth 3 (three) times a week. Take additional dose as needed for wgt gain > 3lb or symptoms of orthopnea (difficulty breathing while lying flat) (Patient taking differently: Take 20 mg by mouth daily. Take additional dose as needed for wgt gain > 3lb or symptoms of orthopnea (difficulty  breathing while lying flat)) 30 tablet 0   saccharomyces boulardii (FLORASTOR) 250 MG capsule Take 250 mg by mouth daily.     No current facility-administered medications for this visit.     Review of Systems    She denies chest pain, palpitations, dyspnea, pnd, orthopnea, n, v, dizziness, syncope, edema, weight gain, or early satiety. All other systems reviewed and are otherwise negative except as noted above.   Physical Exam    VS:  BP (!) 118/50 (BP Location: Left Arm, Patient Position: Sitting, Cuff Size: Normal)   Pulse 78   Ht 5' (1.524 m)   Wt 97 lb (44 kg)   BMI 18.94 kg/m  GEN: Thin, frail but overall well nourished and well developed, in no acute distress. HEENT: normal. Neck: Supple, no JVD, carotid bruits, or masses. Cardiac: RRR, no murmurs, rubs, or gallops. No clubbing, cyanosis, edema.  Radials/DP/PT 2+ and equal bilaterally.  Respiratory:  Respirations regular and unlabored, clear to auscultation bilaterally. GI: Soft, nontender, nondistended, BS + x 4. MS: no deformity or atrophy. Skin: warm and dry, no rash. Neuro:  Strength and sensation are intact. Psych: Normal affect.  Accessory Clinical Findings    ECG personally reviewed by me today - No EKG in office today.   Lab Results  Component Value Date   WBC 5.5 10/02/2022   HGB 8.5 (L) 10/02/2022   HCT 23.9 (L) 10/02/2022   MCV 93.0 10/02/2022   PLT 171 10/02/2022   Lab Results  Component Value Date   CREATININE 2.06 (H) 10/15/2022   BUN 108 (HH) 10/15/2022   NA 126 (L) 10/15/2022   K 4.3 10/15/2022   CL 92 (L) 10/15/2022   CO2 16 (L) 10/15/2022   Lab Results  Component Value Date   ALT 9 05/22/2021   AST 17 05/22/2021   ALKPHOS 62 05/22/2021   BILITOT 0.3 05/22/2021   Lab Results  Component Value Date   CHOL 164 01/28/2020   HDL 60 01/28/2020   LDLCALC 91 01/28/2020   LDLDIRECT 152.9 03/06/2013   TRIG 64 01/28/2020   CHOLHDL 2.7 01/28/2020    No results found for:  "HGBA1C"  Assessment & Plan    1. Chronic systolic heart failure/NICM: Most recent echo showed EF 20 to 25%, LV global hypokinesis, G1 DD, elevated LVEDP, moderately reduced RV systolic function, mild to moderate mitral valve regurgitation, mild to moderate tricuspid valve regurgitation, mild to moderate aortic valve regurgitation. Difficult to manage fluid volume status in the setting of CKD stage IV, hyponatremia.  Per nephrology, she is taking Lasix 20 mg  daily.  Per nephrology, she may take an additional Lasix 20 to 40 mg daily as needed for shortness of breath, swelling, weight gain. Euvolemic and well compensated on exam today.  Will request most recent labs from [facility].  Will plan for repeat BMET in 3 weeks for routine monitoring (per family request).  Continue carvedilol, Lasix.   2. Hyponatremia/hyperkalemia: Following with nephrology.    3. CAD: Cath in 12/2009 showed EF 30%, 40 to 50% pLAD lesion, 30% mid LAD lesion, otherwise normal left circumflex, left main, and RCA. Stable with no anginal symptoms. No indication for ischemic evaluation.  Continue carvedilol.    4. Hyperlipidemia: LDL was 91 in 06/2022.  Monitored and managed per PCP.  Not on statin.   5. CKD stage IIIb-IV: Creatinine was 2.06 on 10/15/2022.  Will request most recent labs from nephrology.  Will check BMET in 3 weeks for ongoing monitoring.  Following with nephrology.    6. Anemia: Has required blood transfusion in the past.  Hemoglobin was stable at 8.5 on 10/02/2022.  Will repeat CBC.   7. OSA: Reports intermittent CPAP use.   8. Disposition: Follow-up in 4-6 months with Dr. Martinique.      Lenna Sciara, NP 12/31/2022, 11:56 AM

## 2023-01-14 DIAGNOSIS — I5022 Chronic systolic (congestive) heart failure: Secondary | ICD-10-CM | POA: Diagnosis not present

## 2023-01-14 DIAGNOSIS — I2510 Atherosclerotic heart disease of native coronary artery without angina pectoris: Secondary | ICD-10-CM | POA: Diagnosis not present

## 2023-01-16 DIAGNOSIS — I2510 Atherosclerotic heart disease of native coronary artery without angina pectoris: Secondary | ICD-10-CM | POA: Diagnosis not present

## 2023-01-16 DIAGNOSIS — I5022 Chronic systolic (congestive) heart failure: Secondary | ICD-10-CM | POA: Diagnosis not present

## 2023-01-21 DIAGNOSIS — N1832 Chronic kidney disease, stage 3b: Secondary | ICD-10-CM | POA: Diagnosis not present

## 2023-01-21 DIAGNOSIS — I130 Hypertensive heart and chronic kidney disease with heart failure and stage 1 through stage 4 chronic kidney disease, or unspecified chronic kidney disease: Secondary | ICD-10-CM | POA: Diagnosis not present

## 2023-01-21 DIAGNOSIS — I10 Essential (primary) hypertension: Secondary | ICD-10-CM | POA: Diagnosis not present

## 2023-01-21 DIAGNOSIS — R82998 Other abnormal findings in urine: Secondary | ICD-10-CM | POA: Diagnosis not present

## 2023-01-21 DIAGNOSIS — I5022 Chronic systolic (congestive) heart failure: Secondary | ICD-10-CM | POA: Diagnosis not present

## 2023-01-29 ENCOUNTER — Encounter: Payer: Self-pay | Admitting: Cardiology

## 2023-02-06 DIAGNOSIS — I509 Heart failure, unspecified: Secondary | ICD-10-CM | POA: Diagnosis not present

## 2023-02-06 DIAGNOSIS — D631 Anemia in chronic kidney disease: Secondary | ICD-10-CM | POA: Diagnosis not present

## 2023-02-06 DIAGNOSIS — R799 Abnormal finding of blood chemistry, unspecified: Secondary | ICD-10-CM | POA: Diagnosis not present

## 2023-02-06 DIAGNOSIS — N184 Chronic kidney disease, stage 4 (severe): Secondary | ICD-10-CM | POA: Diagnosis not present

## 2023-02-06 DIAGNOSIS — E871 Hypo-osmolality and hyponatremia: Secondary | ICD-10-CM | POA: Diagnosis not present

## 2023-02-06 DIAGNOSIS — I129 Hypertensive chronic kidney disease with stage 1 through stage 4 chronic kidney disease, or unspecified chronic kidney disease: Secondary | ICD-10-CM | POA: Diagnosis not present

## 2023-02-06 DIAGNOSIS — N189 Chronic kidney disease, unspecified: Secondary | ICD-10-CM | POA: Diagnosis not present

## 2023-02-14 DIAGNOSIS — E43 Unspecified severe protein-calorie malnutrition: Secondary | ICD-10-CM | POA: Diagnosis not present

## 2023-02-14 DIAGNOSIS — I2510 Atherosclerotic heart disease of native coronary artery without angina pectoris: Secondary | ICD-10-CM | POA: Diagnosis not present

## 2023-02-14 DIAGNOSIS — I5022 Chronic systolic (congestive) heart failure: Secondary | ICD-10-CM | POA: Diagnosis not present

## 2023-02-19 ENCOUNTER — Ambulatory Visit (HOSPITAL_COMMUNITY)
Admission: RE | Admit: 2023-02-19 | Discharge: 2023-02-19 | Disposition: A | Discharge status: Home / Self Care | Payer: Medicare Other | Source: Ambulatory Visit | Attending: Internal Medicine | Admitting: Internal Medicine

## 2023-02-19 VITALS — BP 125/50 | HR 77 | Temp 97.3°F | Resp 17

## 2023-02-19 DIAGNOSIS — N184 Chronic kidney disease, stage 4 (severe): Secondary | ICD-10-CM | POA: Diagnosis not present

## 2023-02-19 DIAGNOSIS — D631 Anemia in chronic kidney disease: Secondary | ICD-10-CM | POA: Insufficient documentation

## 2023-02-19 MED ORDER — EPOETIN ALFA-EPBX 10000 UNIT/ML IJ SOLN
INTRAMUSCULAR | Status: AC
  Administered 2023-02-19: 10000 [IU] via SUBCUTANEOUS
  Filled 2023-02-19: qty 1

## 2023-02-19 MED ORDER — EPOETIN ALFA-EPBX 10000 UNIT/ML IJ SOLN: 10000.0000 [IU] | INTRAMUSCULAR | Status: DC

## 2023-02-19 NOTE — Progress Notes (Signed)
Office notified of hemocue of 7.8. ie Di Kindle, Instructed to go ahead with planned Retacrit

## 2023-02-20 LAB — POCT HEMOGLOBIN-HEMACUE: Hemoglobin: 7.8 g/dL — ABNORMAL LOW (ref 12.0–15.0)

## 2023-03-16 DIAGNOSIS — I2510 Atherosclerotic heart disease of native coronary artery without angina pectoris: Secondary | ICD-10-CM | POA: Diagnosis not present

## 2023-03-16 DIAGNOSIS — I5022 Chronic systolic (congestive) heart failure: Secondary | ICD-10-CM | POA: Diagnosis not present

## 2023-03-16 DIAGNOSIS — E43 Unspecified severe protein-calorie malnutrition: Secondary | ICD-10-CM | POA: Diagnosis not present

## 2023-03-17 DIAGNOSIS — I5022 Chronic systolic (congestive) heart failure: Secondary | ICD-10-CM | POA: Diagnosis not present

## 2023-03-17 DIAGNOSIS — I2510 Atherosclerotic heart disease of native coronary artery without angina pectoris: Secondary | ICD-10-CM | POA: Diagnosis not present

## 2023-03-17 DIAGNOSIS — E43 Unspecified severe protein-calorie malnutrition: Secondary | ICD-10-CM | POA: Diagnosis not present

## 2023-03-19 ENCOUNTER — Encounter (HOSPITAL_COMMUNITY): Payer: Medicare Other

## 2023-03-21 ENCOUNTER — Ambulatory Visit (HOSPITAL_COMMUNITY)
Admission: RE | Admit: 2023-03-21 | Discharge: 2023-03-21 | Disposition: A | Discharge status: Home / Self Care | Payer: Medicare Other | Source: Ambulatory Visit | Attending: Internal Medicine | Admitting: Internal Medicine

## 2023-03-21 VITALS — BP 127/46 | HR 79 | Temp 97.4°F | Resp 17

## 2023-03-21 DIAGNOSIS — N184 Chronic kidney disease, stage 4 (severe): Secondary | ICD-10-CM | POA: Diagnosis not present

## 2023-03-21 LAB — IRON AND TIBC
Iron: 54 ug/dL (ref 28–170)
Saturation Ratios: 16 % (ref 10.4–31.8)
TIBC: 333 ug/dL (ref 250–450)
UIBC: 279 ug/dL

## 2023-03-21 LAB — POCT HEMOGLOBIN-HEMACUE: Hemoglobin: 8.4 g/dL — ABNORMAL LOW (ref 12.0–15.0)

## 2023-03-21 LAB — FERRITIN: Ferritin: 244 ng/mL (ref 11–307)

## 2023-03-21 MED ORDER — EPOETIN ALFA-EPBX 10000 UNIT/ML IJ SOLN
10000.0000 [IU] | INTRAMUSCULAR | Status: DC
  Administered 2023-03-21: 10000 [IU] via SUBCUTANEOUS

## 2023-03-21 MED ORDER — EPOETIN ALFA-EPBX 10000 UNIT/ML IJ SOLN
INTRAMUSCULAR | Status: AC
  Filled 2023-03-21: qty 1

## 2023-03-26 DIAGNOSIS — E871 Hypo-osmolality and hyponatremia: Secondary | ICD-10-CM | POA: Diagnosis not present

## 2023-03-26 DIAGNOSIS — I509 Heart failure, unspecified: Secondary | ICD-10-CM | POA: Diagnosis not present

## 2023-03-26 DIAGNOSIS — E872 Acidosis, unspecified: Secondary | ICD-10-CM | POA: Diagnosis not present

## 2023-03-26 DIAGNOSIS — D631 Anemia in chronic kidney disease: Secondary | ICD-10-CM | POA: Diagnosis not present

## 2023-03-26 DIAGNOSIS — I129 Hypertensive chronic kidney disease with stage 1 through stage 4 chronic kidney disease, or unspecified chronic kidney disease: Secondary | ICD-10-CM | POA: Diagnosis not present

## 2023-03-26 DIAGNOSIS — N184 Chronic kidney disease, stage 4 (severe): Secondary | ICD-10-CM | POA: Diagnosis not present

## 2023-04-04 ENCOUNTER — Other Ambulatory Visit: Payer: Self-pay | Admitting: Cardiology

## 2023-04-11 DIAGNOSIS — I509 Heart failure, unspecified: Secondary | ICD-10-CM | POA: Diagnosis not present

## 2023-04-11 DIAGNOSIS — E871 Hypo-osmolality and hyponatremia: Secondary | ICD-10-CM | POA: Diagnosis not present

## 2023-04-11 DIAGNOSIS — N184 Chronic kidney disease, stage 4 (severe): Secondary | ICD-10-CM | POA: Diagnosis not present

## 2023-04-11 DIAGNOSIS — I129 Hypertensive chronic kidney disease with stage 1 through stage 4 chronic kidney disease, or unspecified chronic kidney disease: Secondary | ICD-10-CM | POA: Diagnosis not present

## 2023-04-11 DIAGNOSIS — E872 Acidosis, unspecified: Secondary | ICD-10-CM | POA: Diagnosis not present

## 2023-04-11 DIAGNOSIS — D631 Anemia in chronic kidney disease: Secondary | ICD-10-CM | POA: Diagnosis not present

## 2023-04-15 DIAGNOSIS — I5022 Chronic systolic (congestive) heart failure: Secondary | ICD-10-CM | POA: Diagnosis not present

## 2023-04-15 DIAGNOSIS — E43 Unspecified severe protein-calorie malnutrition: Secondary | ICD-10-CM | POA: Diagnosis not present

## 2023-04-15 DIAGNOSIS — I2510 Atherosclerotic heart disease of native coronary artery without angina pectoris: Secondary | ICD-10-CM | POA: Diagnosis not present

## 2023-04-16 DIAGNOSIS — I2510 Atherosclerotic heart disease of native coronary artery without angina pectoris: Secondary | ICD-10-CM | POA: Diagnosis not present

## 2023-04-16 DIAGNOSIS — I5022 Chronic systolic (congestive) heart failure: Secondary | ICD-10-CM | POA: Diagnosis not present

## 2023-04-16 DIAGNOSIS — E43 Unspecified severe protein-calorie malnutrition: Secondary | ICD-10-CM | POA: Diagnosis not present

## 2023-04-17 ENCOUNTER — Other Ambulatory Visit (HOSPITAL_COMMUNITY): Payer: Self-pay | Admitting: *Deleted

## 2023-04-18 ENCOUNTER — Ambulatory Visit (HOSPITAL_COMMUNITY)
Admission: RE | Admit: 2023-04-18 | Discharge: 2023-04-18 | Disposition: A | Discharge status: Home / Self Care | Payer: Medicare Other | Source: Ambulatory Visit | Attending: Internal Medicine | Admitting: Internal Medicine

## 2023-04-18 VITALS — BP 117/37 | HR 67 | Temp 97.2°F | Resp 17

## 2023-04-18 DIAGNOSIS — N184 Chronic kidney disease, stage 4 (severe): Secondary | ICD-10-CM

## 2023-04-18 LAB — IRON AND TIBC
Iron: 82 ug/dL (ref 28–170)
Saturation Ratios: 22 % (ref 10.4–31.8)
TIBC: 372 ug/dL (ref 250–450)
UIBC: 290 ug/dL

## 2023-04-18 LAB — POCT HEMOGLOBIN-HEMACUE: Hemoglobin: 8.2 g/dL — ABNORMAL LOW (ref 12.0–15.0)

## 2023-04-18 LAB — FERRITIN: Ferritin: 230 ng/mL (ref 11–307)

## 2023-04-18 MED ORDER — EPOETIN ALFA-EPBX 10000 UNIT/ML IJ SOLN
INTRAMUSCULAR | Status: AC
  Filled 2023-04-18: qty 1

## 2023-04-18 MED ORDER — EPOETIN ALFA-EPBX 10000 UNIT/ML IJ SOLN
10000.0000 [IU] | INTRAMUSCULAR | Status: DC
  Administered 2023-04-18: 10000 [IU] via SUBCUTANEOUS

## 2023-05-02 ENCOUNTER — Ambulatory Visit (HOSPITAL_COMMUNITY)
Admission: RE | Admit: 2023-05-02 | Discharge: 2023-05-02 | Disposition: A | Discharge status: Home / Self Care | Payer: Medicare Other | Source: Ambulatory Visit | Attending: Internal Medicine | Admitting: Internal Medicine

## 2023-05-02 DIAGNOSIS — N184 Chronic kidney disease, stage 4 (severe): Secondary | ICD-10-CM | POA: Diagnosis not present

## 2023-05-02 MED ORDER — SODIUM CHLORIDE 0.9 % IV SOLN
510.0000 mg | INTRAVENOUS | Status: DC
  Administered 2023-05-02: 510 mg via INTRAVENOUS
  Filled 2023-05-02: qty 510

## 2023-05-08 DIAGNOSIS — I129 Hypertensive chronic kidney disease with stage 1 through stage 4 chronic kidney disease, or unspecified chronic kidney disease: Secondary | ICD-10-CM | POA: Diagnosis not present

## 2023-05-08 DIAGNOSIS — N390 Urinary tract infection, site not specified: Secondary | ICD-10-CM | POA: Diagnosis not present

## 2023-05-08 DIAGNOSIS — N184 Chronic kidney disease, stage 4 (severe): Secondary | ICD-10-CM | POA: Diagnosis not present

## 2023-05-08 DIAGNOSIS — I509 Heart failure, unspecified: Secondary | ICD-10-CM | POA: Diagnosis not present

## 2023-05-08 DIAGNOSIS — D631 Anemia in chronic kidney disease: Secondary | ICD-10-CM | POA: Diagnosis not present

## 2023-05-15 DIAGNOSIS — I5022 Chronic systolic (congestive) heart failure: Secondary | ICD-10-CM | POA: Diagnosis not present

## 2023-05-15 DIAGNOSIS — I2510 Atherosclerotic heart disease of native coronary artery without angina pectoris: Secondary | ICD-10-CM | POA: Diagnosis not present

## 2023-05-16 ENCOUNTER — Encounter (HOSPITAL_COMMUNITY): Payer: Medicare Other

## 2023-05-16 ENCOUNTER — Encounter (HOSPITAL_COMMUNITY)
Admission: RE | Admit: 2023-05-16 | Discharge: 2023-05-16 | Disposition: A | Discharge status: Home / Self Care | Payer: Medicare Other | Source: Ambulatory Visit | Attending: Internal Medicine | Admitting: Internal Medicine

## 2023-05-16 VITALS — BP 126/50 | HR 72 | Temp 97.9°F | Resp 17

## 2023-05-16 DIAGNOSIS — N184 Chronic kidney disease, stage 4 (severe): Secondary | ICD-10-CM | POA: Diagnosis not present

## 2023-05-16 DIAGNOSIS — D631 Anemia in chronic kidney disease: Secondary | ICD-10-CM | POA: Diagnosis not present

## 2023-05-16 LAB — POCT HEMOGLOBIN-HEMACUE: Hemoglobin: 8.6 g/dL — ABNORMAL LOW (ref 12.0–15.0)

## 2023-05-16 MED ORDER — SODIUM CHLORIDE 0.9 % IV SOLN
510.0000 mg | INTRAVENOUS | Status: AC
  Administered 2023-05-16: 510 mg via INTRAVENOUS
  Filled 2023-05-16: qty 510

## 2023-05-16 MED ORDER — EPOETIN ALFA-EPBX 10000 UNIT/ML IJ SOLN
10000.0000 [IU] | INTRAMUSCULAR | Status: DC
  Administered 2023-05-16: 10000 [IU] via SUBCUTANEOUS

## 2023-05-16 MED ORDER — EPOETIN ALFA-EPBX 10000 UNIT/ML IJ SOLN
INTRAMUSCULAR | Status: AC
  Filled 2023-05-16: qty 1

## 2023-05-17 DIAGNOSIS — I5022 Chronic systolic (congestive) heart failure: Secondary | ICD-10-CM | POA: Diagnosis not present

## 2023-05-17 DIAGNOSIS — I2510 Atherosclerotic heart disease of native coronary artery without angina pectoris: Secondary | ICD-10-CM | POA: Diagnosis not present

## 2023-05-19 NOTE — Progress Notes (Unsigned)
Cardiology Office Note:    Date:  07/31/2022   ID:  Paige Cook, DOB [birth year], MRN [id number]  PCP:  Geoffry Paradise, MD  Cardiologist:  Miraya Cudney [place], MD  Electrophysiologist:  None   Referring MD: Geoffry Paradise, MD   Chief Complaint  Patient presents with   Congestive Heart Failure    History of Present Illness:    Paige Cook is a 89 y.o. female with a hx of nonischemic cardiomyopathy, PACs, CAD, hyperlipidemia, and OSA on CPAP.  She is seen for follow up CHF. Previous cardiac catheterization on 01/10/2010 showed EF 30%, 40 to 50% proximal LAD lesion, 30% mid LAD lesion, otherwise normal left circumflex, and left main and RCA.  Patient had EF of 40% in 2012.  Subsequent echocardiogram in 2013 showed EF of 45%.  He has history of very frequent PACs on the Holter monitor however no atrial fibrillation was identified.    Patient was seen in February 2020 and noted to have mild edema however otherwise asymptomatic.  In August he presented with symptom of PND.  Her Lasix was increased.  Echocardiogram obtained showed decreased LVEF to 20% with moderate MR.  It was noted she was noncompliant with CPAP therapy.  Symptoms did not improve with increased Lasix.   Titration of medication was limited by CKD and her low BP.  Due to worsening renal function in August, Lasix was reduced to 3 times a week.  She later reported increased edema and shortness of breath.  Lasix was eventually increased back to daily dosing and symptoms improved.   She was admitted in June 2022 with C difficile colitis. Treated with antibiotics.  She was admitted August 10 with generalized weakness and hyponatremia. Hydrated with NS and lasix held.   She resented to the ED on 09/30/2022 with weakness, fatigue, intermittent shortness of breath in the setting of acute on chronic systolic heart failure  Repeat echocardiogram showed EF 20 to 25%, LV global hypokinesis, G1 DD, elevated LVEDP, moderately reduced RV  systolic function, mild to moderate mitral valve regurgitation, mild to moderate tricuspid valve regurgitation, mild to moderate aortic valve regurgitation. She was diuresed and  pral Lasix dosing was transitioned to 20 mg 2 to 3 days a week and as needed for worsening orthopnea, weight gain more than 3 pounds overnight.  Adequate hydration was encouraged.Creatinine was 2.28 on discharge, potassium was 4.8.  With worsening renal function, concern for cardiorenal syndrome, palliative care was consulted.    She saw her nephrologist in December 2023 and was told that she may increase her Lasix to 20 mg daily.  She states she was also told that she may take an additional Lasix 20 to 40 mg daily as needed for shortness of breath, swelling, weight gain.   She was seen in our office in January and appeared to be stable.   Since DC she notes some increase in SOB. Notes mild ankle swelling. Was seen by primary care yesterday and serum sodium level increased to 126 and CXR showed increased pulmonary edema and some effusions. She is not weighing at home regularly. Did take lasix yesterday and today.    Past Medical History:  Diagnosis Date   Acute on chronic systolic (congestive) heart failure (HCC) 08/14/2019   AKI (acute kidney injury) (HCC) 05/22/2021   Allergic rhinitis    Anemia    Arthritis    Bladder cancer (HCC)    giant tumor   CAD (coronary artery disease)    Cardiac arrhythmia  benign   Carotid bruit    left   CHF (congestive heart failure) (HCC)    CHF with systolic dysfuntion ; dilated nonishemic cardiomyopathy   Colon cancer (HCC) 1989   Colon polyps    Diverticulosis    Epistaxis    Female bladder prolapse    Gallstones    Heart failure (HCC)    Hypercholesteremia    Hyponatremia    Probably secondary to her CHF   OSA (obstructive sleep apnea) 04/29/2012   SOB (shortness of breath)    Sometimes at night    Past Surgical History:  Procedure Laterality Date   ABDOMINAL  HYSTERECTOMY     BLADDER SURGERY  2013   cancer removed   bladder tact     CARDIAC CATHETERIZATION     non obstruction CAD   CHOLECYSTECTOMY     COLECTOMY     sigmoid 1989 cancer   COLONOSCOPY      Current Medications: Current Meds  Medication Sig   carvedilol (COREG) 6.25 MG tablet TAKE 1 TABLET BY MOUTH TWICE A DAY WITH MEALS   Cholecalciferol (VITAMIN D3) 50 MCG (2000 UT) TABS Take 50 mcg by mouth.   cyanocobalamin (VITAMIN B12) 500 MCG tablet Take 1 tablet (500 mcg total) by mouth daily.   furosemide (LASIX) 40 MG tablet Take 20 mg by mouth daily in the afternoon.   saccharomyces boulardii (FLORASTOR) 250 MG capsule Take 250 mg by mouth daily.   vitamin C (ASCORBIC ACID) 250 MG tablet Take 500 mg by mouth daily with breakfast.    [DISCONTINUED] hydrALAZINE (APRESOLINE) 10 MG tablet TAKE 1 TABLET BY MOUTH THREE TIMES A DAY     Allergies:   Penicillins and Tramadol   Social History   Socioeconomic History   Marital status: Married    Spouse name: Not on file   Number of children: 4   Years of education: Not on file   Highest education level: Not on file  Occupational History   Occupation: retired Dentist  Tobacco Use   Smoking status: Never   Smokeless tobacco: Never  Substance and Sexual Activity   Alcohol use: No   Drug use: No   Sexual activity: Not Currently  Other Topics Concern   Not on file  Social History Narrative   Married, 1 son and 3 daughters.    One coffee a day no alcohol no tobacco   05/05/2015   Social Determinants of Health   Financial Resource Strain: Not on file  Food Insecurity: Not on file  Transportation Needs: Not on file  Physical Activity: Not on file  Stress: Not on file  Social Connections: Not on file     Family History: The patient's family history includes Heart attack in her father and mother; Heart disease in her father; Heart failure (age of onset: 87) in her sister. There is no history of Stroke.  ROS:    Please see the history of present illness.     All other systems reviewed and are negative.  EKGs/Labs/Other Studies Reviewed:    The following studies were reviewed today:  Cath 01/10/2010      Echo 07/30/2019 IMPRESSIONS      1. The left ventricle has a visually estimated ejection fraction of 20%. The cavity size was severely dilated. Left ventricular diastolic Doppler parameters are consistent with restrictive filling. Elevated left ventricular end-diastolic pressure Left  ventrical global hypokinesis without regional wall motion abnormalities.  2. The right ventricle has normal systolic function.  The cavity was normal. There is no increase in right ventricular wall thickness.  3. Left atrial size was moderately dilated.  4. Right atrial size was mildly dilated.  5. Moderate thickening of the mitral valve leaflet. Mild calcification of the mitral valve leaflet. There is mild mitral annular calcification present. Mitral valve regurgitation is moderate by color flow Doppler.  6. The aortic valve is tricuspid. Moderate thickening of the aortic valve. Sclerosis without any evidence of stenosis of the aortic valve. Aortic valve regurgitation is mild by color flow Doppler.  7. The aorta is normal unless otherwise noted.  EKG:  EKG is not done today.    Recent Labs: 07/25/2022: Magnesium 2.0 07/28/2022: BUN 61; Creatinine, Ser 1.91; Hemoglobin 7.5; Platelets 170; Potassium 5.1; Sodium 128  Recent Lipid Panel    Component Value Date/Time   CHOL 164 01/28/2020 0940   TRIG 64 01/28/2020 0940   HDL 60 01/28/2020 0940   CHOLHDL 2.7 01/28/2020 0940   CHOLHDL 2.2 11/03/2015 1042   VLDL 15 11/03/2015 1042   LDLCALC 91 01/28/2020 0940   LDLDIRECT 152.9 03/06/2013 0924   Dated 06/22/19: cholesterol 178, triglycerides 51, HDL 64, LDL 104. LFTs normal. Hgb 11. TSH normal Dated 12/24/19: creatinine 1.89. potassium 5.2.  Dated 06/27/20: BUN 88, creatinine 1.6. cholesterol 177, triglycerides 73,  HDL 70, LDL 92. Hgb 10.8. plt 177K. TSH normal. Dated 07/20/21: cholesterol 175, triglycerides 64, HDL 52, LDL 110. BUN 94, creatinine 1.97. otherwise CMET normal. Dated 07/16/22: cholesterol 174, triglycerides 85, HDL 66, LDL 91. BUN 69, TSh normal.  Dated 01/17/23: BUN 115, creatinine 3. GFR 21.   Physical Exam:    VS:  BP (!) 98/59   Pulse 72   Ht 5\' 4"  (1.626 m)   Wt 111 lb 12.8 oz (50.7 kg)   SpO2 97%   BMI 19.19 kg/m     Wt Readings from Last 3 Encounters:  07/31/22 111 lb 12.8 oz (50.7 kg)  07/27/22 107 lb 12.9 oz (48.9 kg)  02/27/22 95 lb (43.1 kg)     GEN:  Elderly, frail WF  walks with a walker.  HEENT: Normal NECK: + JVD; No carotid bruits Back: kyphoscoliosis CARDIAC: RRR, no murmurs, rubs, gallops RESPIRATORY:  bibasilar rales.  ABDOMEN: Soft, non-tender, non-distended MUSCULOSKELETAL:  No ankle edema; marked scoliosis SKIN: Warm and dry NEUROLOGIC:  Alert and oriented x 3 PSYCHIATRIC:  Normal affect   ASSESSMENT:    1. Chronic systolic HF (heart failure) (HCC)   2. Essential hypertension   3. Hyperlipidemia LDL goal <70   4. Coronary artery disease involving native coronary artery of native heart without angina pectoris   5. Stage 3b chronic kidney disease (HCC)      PLAN:    In order of problems listed above:  1. Chronic systolic heart failure/NICM: Most recent echo showed EF 20 to 25%, LV global hypokinesis, G1 DD, elevated LVEDP, moderately reduced RV systolic function, mild to moderate mitral valve regurgitation, mild to moderate tricuspid valve regurgitation, mild to moderate aortic valve regurgitation. Difficult to manage fluid volume status in the setting of CKD stage IV, hyponatremia.  Per nephrology, she is taking Lasix 20 mg daily.  Per nephrology, she may take an additional Lasix 20 to 40 mg daily as needed for shortness of breath, swelling, weight gain. Euvolemic and well compensated on exam today.  Will request most recent labs from [facility].  Will plan for repeat BMET in 3 weeks for routine monitoring (per family request).  Continue carvedilol, Lasix.   2. Hyponatremia/hyperkalemia: Following with nephrology.    3. CAD: Cath in 12/2009 showed EF 30%, 40 to 50% pLAD lesion, 30% mid LAD lesion, otherwise normal left circumflex, left main, and RCA. Stable with no anginal symptoms. No indication for ischemic evaluation.  Continue carvedilol.   4. Hyperlipidemia: LDL was 91 in 06/2022.  Monitored and managed per PCP.  Not on statin.  5. CKD stage IIIb-IV: Creatinine was 2.06 on 10/15/2022.  Will request most recent labs from nephrology.  Will check BMET in 3 weeks for ongoing monitoring.  Following with nephrology.   6. Anemia: Has required blood transfusion in the past.  Hemoglobin was stable at 8.5 on 10/02/2022.  Will repeat CBC.  7. OSA: Reports intermittent CPAP use.  8. Disposition: Follow-up in 4-6 months with Dr. [place].    Medication Adjustments/Labs and Tests Ordered: Current medicines are reviewed at length with the patient today.  Concerns regarding medicines are outlined above.  No orders of the defined types were placed in this encounter.  No orders of the defined types were placed in this encounter.   Patient Instructions  Sodium restriction   Restrict fluid intake to less than 1.2 liters a day (36 oz).  Resume lasix 20 mg daily.  Stop taking hydralazine  Will arrange follow up in 2 weeks.   Weigh yourself daily. Let us know if weight goes up 3 lbs or more.      Signed, Trinitie Mcgirr [place], MD  07/31/2022 1:36 PM    [facility]

## 2023-05-20 ENCOUNTER — Ambulatory Visit: Payer: Medicare Other | Attending: Cardiology | Admitting: Cardiology

## 2023-05-20 ENCOUNTER — Encounter: Payer: Self-pay | Admitting: Cardiology

## 2023-05-20 VITALS — BP 112/74 | HR 77 | Ht 64.0 in | Wt 99.0 lb

## 2023-05-20 DIAGNOSIS — N185 Chronic kidney disease, stage 5: Secondary | ICD-10-CM | POA: Diagnosis not present

## 2023-05-20 DIAGNOSIS — I428 Other cardiomyopathies: Secondary | ICD-10-CM | POA: Insufficient documentation

## 2023-05-20 DIAGNOSIS — I5022 Chronic systolic (congestive) heart failure: Secondary | ICD-10-CM | POA: Diagnosis not present

## 2023-05-20 DIAGNOSIS — I2510 Atherosclerotic heart disease of native coronary artery without angina pectoris: Secondary | ICD-10-CM

## 2023-05-20 NOTE — Patient Instructions (Signed)
Medication Instructions:  Continue same medications *If you need a refill on your cardiac medications before your next appointment, please call your pharmacy*   Lab Work: None ordered   Testing/Procedures: None ordered   Follow-Up: At [facility], you and your health needs are our priority.  As part of our continuing mission to provide you with exceptional heart care, we have created designated Provider Care Teams.  These Care Teams include your primary Cardiologist (physician) and Advanced Practice Providers (APPs -  Physician Assistants and Nurse Practitioners) who all work together to provide you with the care you need, when you need it.  We recommend signing up for the patient portal called "MyChart".  Sign up information is provided on this After Visit Summary.  MyChart is used to connect with patients for Virtual Visits (Telemedicine).  Patients are able to view lab/test results, encounter notes, upcoming appointments, etc.  Non-urgent messages can be sent to your provider as well.   To learn more about what you can do with MyChart, go to [email or web address].    Your next appointment:  4 months    Provider:  Dr.Jordan   

## 2023-05-28 DIAGNOSIS — R4182 Altered mental status, unspecified: Secondary | ICD-10-CM | POA: Diagnosis not present

## 2023-05-28 DIAGNOSIS — N186 End stage renal disease: Secondary | ICD-10-CM | POA: Diagnosis not present

## 2023-05-28 DIAGNOSIS — D631 Anemia in chronic kidney disease: Secondary | ICD-10-CM | POA: Diagnosis not present

## 2023-05-28 DIAGNOSIS — R0601 Orthopnea: Secondary | ICD-10-CM | POA: Diagnosis not present

## 2023-05-28 DIAGNOSIS — I132 Hypertensive heart and chronic kidney disease with heart failure and with stage 5 chronic kidney disease, or end stage renal disease: Secondary | ICD-10-CM | POA: Diagnosis not present

## 2023-05-28 DIAGNOSIS — I2510 Atherosclerotic heart disease of native coronary artery without angina pectoris: Secondary | ICD-10-CM | POA: Diagnosis not present

## 2023-05-28 DIAGNOSIS — I509 Heart failure, unspecified: Secondary | ICD-10-CM | POA: Diagnosis not present

## 2023-05-28 DIAGNOSIS — E785 Hyperlipidemia, unspecified: Secondary | ICD-10-CM | POA: Diagnosis not present

## 2023-05-29 DIAGNOSIS — I509 Heart failure, unspecified: Secondary | ICD-10-CM | POA: Diagnosis not present

## 2023-05-29 DIAGNOSIS — I132 Hypertensive heart and chronic kidney disease with heart failure and with stage 5 chronic kidney disease, or end stage renal disease: Secondary | ICD-10-CM | POA: Diagnosis not present

## 2023-05-29 DIAGNOSIS — I2510 Atherosclerotic heart disease of native coronary artery without angina pectoris: Secondary | ICD-10-CM | POA: Diagnosis not present

## 2023-05-29 DIAGNOSIS — D631 Anemia in chronic kidney disease: Secondary | ICD-10-CM | POA: Diagnosis not present

## 2023-05-29 DIAGNOSIS — N186 End stage renal disease: Secondary | ICD-10-CM | POA: Diagnosis not present

## 2023-05-29 DIAGNOSIS — R4182 Altered mental status, unspecified: Secondary | ICD-10-CM | POA: Diagnosis not present

## 2023-05-31 DIAGNOSIS — N186 End stage renal disease: Secondary | ICD-10-CM | POA: Diagnosis not present

## 2023-05-31 DIAGNOSIS — R4182 Altered mental status, unspecified: Secondary | ICD-10-CM | POA: Diagnosis not present

## 2023-05-31 DIAGNOSIS — D631 Anemia in chronic kidney disease: Secondary | ICD-10-CM | POA: Diagnosis not present

## 2023-05-31 DIAGNOSIS — I132 Hypertensive heart and chronic kidney disease with heart failure and with stage 5 chronic kidney disease, or end stage renal disease: Secondary | ICD-10-CM | POA: Diagnosis not present

## 2023-05-31 DIAGNOSIS — I2510 Atherosclerotic heart disease of native coronary artery without angina pectoris: Secondary | ICD-10-CM | POA: Diagnosis not present

## 2023-05-31 DIAGNOSIS — I509 Heart failure, unspecified: Secondary | ICD-10-CM | POA: Diagnosis not present

## 2023-06-02 DIAGNOSIS — N186 End stage renal disease: Secondary | ICD-10-CM | POA: Diagnosis not present

## 2023-06-02 DIAGNOSIS — I2510 Atherosclerotic heart disease of native coronary artery without angina pectoris: Secondary | ICD-10-CM | POA: Diagnosis not present

## 2023-06-02 DIAGNOSIS — D631 Anemia in chronic kidney disease: Secondary | ICD-10-CM | POA: Diagnosis not present

## 2023-06-02 DIAGNOSIS — I132 Hypertensive heart and chronic kidney disease with heart failure and with stage 5 chronic kidney disease, or end stage renal disease: Secondary | ICD-10-CM | POA: Diagnosis not present

## 2023-06-02 DIAGNOSIS — I509 Heart failure, unspecified: Secondary | ICD-10-CM | POA: Diagnosis not present

## 2023-06-02 DIAGNOSIS — R4182 Altered mental status, unspecified: Secondary | ICD-10-CM | POA: Diagnosis not present

## 2023-06-03 DIAGNOSIS — I132 Hypertensive heart and chronic kidney disease with heart failure and with stage 5 chronic kidney disease, or end stage renal disease: Secondary | ICD-10-CM | POA: Diagnosis not present

## 2023-06-03 DIAGNOSIS — I2510 Atherosclerotic heart disease of native coronary artery without angina pectoris: Secondary | ICD-10-CM | POA: Diagnosis not present

## 2023-06-03 DIAGNOSIS — I509 Heart failure, unspecified: Secondary | ICD-10-CM | POA: Diagnosis not present

## 2023-06-03 DIAGNOSIS — D631 Anemia in chronic kidney disease: Secondary | ICD-10-CM | POA: Diagnosis not present

## 2023-06-03 DIAGNOSIS — N186 End stage renal disease: Secondary | ICD-10-CM | POA: Diagnosis not present

## 2023-06-03 DIAGNOSIS — R4182 Altered mental status, unspecified: Secondary | ICD-10-CM | POA: Diagnosis not present

## 2023-06-04 DIAGNOSIS — D631 Anemia in chronic kidney disease: Secondary | ICD-10-CM | POA: Diagnosis not present

## 2023-06-04 DIAGNOSIS — I509 Heart failure, unspecified: Secondary | ICD-10-CM | POA: Diagnosis not present

## 2023-06-04 DIAGNOSIS — N186 End stage renal disease: Secondary | ICD-10-CM | POA: Diagnosis not present

## 2023-06-04 DIAGNOSIS — I132 Hypertensive heart and chronic kidney disease with heart failure and with stage 5 chronic kidney disease, or end stage renal disease: Secondary | ICD-10-CM | POA: Diagnosis not present

## 2023-06-04 DIAGNOSIS — R4182 Altered mental status, unspecified: Secondary | ICD-10-CM | POA: Diagnosis not present

## 2023-06-04 DIAGNOSIS — I2510 Atherosclerotic heart disease of native coronary artery without angina pectoris: Secondary | ICD-10-CM | POA: Diagnosis not present

## 2023-06-13 ENCOUNTER — Encounter (HOSPITAL_COMMUNITY): Payer: Medicare Other

## 2023-06-14 DIAGNOSIS — I2510 Atherosclerotic heart disease of native coronary artery without angina pectoris: Secondary | ICD-10-CM | POA: Diagnosis not present

## 2023-06-14 DIAGNOSIS — I5022 Chronic systolic (congestive) heart failure: Secondary | ICD-10-CM | POA: Diagnosis not present

## 2023-06-16 DIAGNOSIS — I2510 Atherosclerotic heart disease of native coronary artery without angina pectoris: Secondary | ICD-10-CM | POA: Diagnosis not present

## 2023-06-16 DIAGNOSIS — I5022 Chronic systolic (congestive) heart failure: Secondary | ICD-10-CM | POA: Diagnosis not present

## 2023-06-17 DEATH — deceased

## 2023-09-09 ENCOUNTER — Ambulatory Visit: Payer: Medicare Other | Admitting: Nurse Practitioner
